# Patient Record
Sex: Male | Born: 1960 | Race: White | Hispanic: No | Marital: Married | Smoking: Never smoker
Health system: Southern US, Community
[De-identification: ages and names within clinical notes are randomized; demographics above are authoritative.]

## PROBLEM LIST (undated history)

## (undated) DIAGNOSIS — G4733 Obstructive sleep apnea (adult) (pediatric): Secondary | ICD-10-CM

## (undated) DIAGNOSIS — F32A Depression, unspecified: Secondary | ICD-10-CM

## (undated) DIAGNOSIS — T6702XA Exertional heatstroke, initial encounter: Secondary | ICD-10-CM

## (undated) DIAGNOSIS — I2510 Atherosclerotic heart disease of native coronary artery without angina pectoris: Secondary | ICD-10-CM

## (undated) DIAGNOSIS — K219 Gastro-esophageal reflux disease without esophagitis: Secondary | ICD-10-CM

## (undated) DIAGNOSIS — Z8719 Personal history of other diseases of the digestive system: Secondary | ICD-10-CM

## (undated) DIAGNOSIS — E785 Hyperlipidemia, unspecified: Secondary | ICD-10-CM

## (undated) DIAGNOSIS — G8929 Other chronic pain: Secondary | ICD-10-CM

## (undated) DIAGNOSIS — I6529 Occlusion and stenosis of unspecified carotid artery: Secondary | ICD-10-CM

## (undated) DIAGNOSIS — K760 Fatty (change of) liver, not elsewhere classified: Secondary | ICD-10-CM

## (undated) DIAGNOSIS — I10 Essential (primary) hypertension: Secondary | ICD-10-CM

## (undated) DIAGNOSIS — M1990 Unspecified osteoarthritis, unspecified site: Secondary | ICD-10-CM

## (undated) DIAGNOSIS — Z8739 Personal history of other diseases of the musculoskeletal system and connective tissue: Secondary | ICD-10-CM

## (undated) DIAGNOSIS — I493 Ventricular premature depolarization: Secondary | ICD-10-CM

## (undated) DIAGNOSIS — G4730 Sleep apnea, unspecified: Secondary | ICD-10-CM

## (undated) DIAGNOSIS — H409 Unspecified glaucoma: Secondary | ICD-10-CM

## (undated) DIAGNOSIS — Z9989 Dependence on other enabling machines and devices: Secondary | ICD-10-CM

## (undated) DIAGNOSIS — F329 Major depressive disorder, single episode, unspecified: Secondary | ICD-10-CM

## (undated) DIAGNOSIS — H269 Unspecified cataract: Secondary | ICD-10-CM

## (undated) DIAGNOSIS — K5792 Diverticulitis of intestine, part unspecified, without perforation or abscess without bleeding: Secondary | ICD-10-CM

## (undated) DIAGNOSIS — M549 Dorsalgia, unspecified: Secondary | ICD-10-CM

## (undated) DIAGNOSIS — J189 Pneumonia, unspecified organism: Secondary | ICD-10-CM

## (undated) DIAGNOSIS — F419 Anxiety disorder, unspecified: Secondary | ICD-10-CM

## (undated) DIAGNOSIS — Z8489 Family history of other specified conditions: Secondary | ICD-10-CM

## (undated) DIAGNOSIS — T7840XA Allergy, unspecified, initial encounter: Secondary | ICD-10-CM

## (undated) HISTORY — DX: Sleep apnea, unspecified: G47.30

## (undated) HISTORY — DX: Unspecified cataract: H26.9

## (undated) HISTORY — DX: Unspecified glaucoma: H40.9

## (undated) HISTORY — PX: COLONOSCOPY: SHX5424

## (undated) HISTORY — PX: SURGERY SCROTAL / TESTICULAR: SUR1316

## (undated) HISTORY — DX: Ventricular premature depolarization: I49.3

## (undated) HISTORY — PX: TONSILLECTOMY: SUR1361

## (undated) HISTORY — PX: SHOULDER SURGERY: SHX246

## (undated) HISTORY — DX: Hyperlipidemia, unspecified: E78.5

## (undated) HISTORY — DX: Allergy, unspecified, initial encounter: T78.40XA

## (undated) HISTORY — PX: EYE SURGERY: SHX253

---
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
## 1999-12-29 ENCOUNTER — Ambulatory Visit (HOSPITAL_COMMUNITY): Admission: RE | Admit: 1999-12-29 | Discharge: 1999-12-29 | Payer: Self-pay | Admitting: *Deleted

## 1999-12-29 ENCOUNTER — Encounter (INDEPENDENT_AMBULATORY_CARE_PROVIDER_SITE_OTHER): Payer: Self-pay | Admitting: Specialist

## 2004-10-02 ENCOUNTER — Ambulatory Visit (HOSPITAL_COMMUNITY): Admission: RE | Admit: 2004-10-02 | Discharge: 2004-10-02 | Payer: Self-pay | Admitting: *Deleted

## 2004-10-02 ENCOUNTER — Encounter (INDEPENDENT_AMBULATORY_CARE_PROVIDER_SITE_OTHER): Payer: Self-pay | Admitting: *Deleted

## 2008-02-22 ENCOUNTER — Encounter: Admission: RE | Admit: 2008-02-22 | Discharge: 2008-02-22 | Payer: Self-pay | Admitting: Internal Medicine

## 2008-03-09 ENCOUNTER — Encounter (INDEPENDENT_AMBULATORY_CARE_PROVIDER_SITE_OTHER): Payer: Self-pay | Admitting: Interventional Radiology

## 2008-03-09 ENCOUNTER — Ambulatory Visit (HOSPITAL_COMMUNITY): Admission: RE | Admit: 2008-03-09 | Discharge: 2008-03-09 | Payer: Self-pay | Admitting: Internal Medicine

## 2008-04-16 ENCOUNTER — Ambulatory Visit (HOSPITAL_COMMUNITY): Admission: RE | Admit: 2008-04-16 | Discharge: 2008-04-16 | Payer: Self-pay | Admitting: *Deleted

## 2008-04-16 ENCOUNTER — Encounter (INDEPENDENT_AMBULATORY_CARE_PROVIDER_SITE_OTHER): Payer: Self-pay | Admitting: *Deleted

## 2009-06-07 ENCOUNTER — Encounter: Admission: RE | Admit: 2009-06-07 | Discharge: 2009-06-07 | Payer: Self-pay | Admitting: Internal Medicine

## 2010-02-04 ENCOUNTER — Encounter: Admission: RE | Admit: 2010-02-04 | Discharge: 2010-02-04 | Payer: Self-pay | Admitting: Internal Medicine

## 2010-08-10 ENCOUNTER — Encounter: Payer: Self-pay | Admitting: Internal Medicine

## 2010-12-02 NOTE — Op Note (Signed)
William Mason, William Mason               ACCOUNT NO.:  [id number]   MEDICAL RECORD NO.:  [id number]          PATIENT TYPE:  AMB   LOCATION:  ENDO                         FACILITY:  [facility]   PHYSICIAN:  Georgiana Spinner, M.D.    DATE OF BIRTH:  October 24, 1960   DATE OF PROCEDURE:  DATE OF DISCHARGE:                               OPERATIVE REPORT   PROCEDURE:  Colonoscopy.   INDICATIONS:  Colon polyps.   ANESTHESIA:  Fentanyl 100 mcg, Versed 10 mg.   PROCEDURE:  With the patient mildly sedated in the left lateral  decubitus position a rectal exam was performed which was unremarkable to  my examination.  The prostate felt normal, but I may not have felt the  whole gland.  Subsequently, the Pentax videoscopic colonoscope was  inserted in the rectum and passed under direct vision to the cecum  identified by ileocecal valve and appendiceal orifice, both of which  were photographed.  From this point the colonoscope was slowly withdrawn  taking circumferential views of the colonic mucosa, stopping in the  ascending colon where two polyps were seen and each was removed first  with snare cautery technique and the second with hot biopsy forceps  technique.  Both were photographed and retrieved.  The endoscope was  then withdrawn all the way to the rectum which appeared normal.  On  direct showed hemorrhoids on retroflexed view.  The endoscope was  straightened and withdrawn.  The patient's vital signs and pulse  oximeter remained stable.  The patient tolerated the procedure well  without apparent complications.   FINDINGS:  1. Two small polyps of ascending colon, otherwise unremarkable      examination.  2. Internal hemorrhoids were noted.   PLAN:  Await biopsy report.  The patient will call me for results and  follow-up with me as needed as an outpatient.           ______________________________  Georgiana Spinner, M.D.     GMO/MEDQ  D:  04/16/2008  T:  04/16/2008  Job:  [id number]

## 2010-12-05 NOTE — Op Note (Signed)
[name], William Mason               ACCOUNT NO.:  [id number]   MEDICAL RECORD NO.:  [id number]          PATIENT TYPE:  AMB   LOCATION:  ENDO                         FACILITY:  [facility]   PHYSICIAN:  Georgiana Spinner, M.D.    DATE OF BIRTH:  1961/01/29   DATE OF PROCEDURE:  11/18/2004  DATE OF DISCHARGE:  10/02/2004                                 OPERATIVE REPORT   PROCEDURE:  Colonoscopy with biopsy.   INDICATIONS:  Rectal bleeding with colon polyp.   ANESTHESIA:  Demerol 80, Versed 8 milligrams.   PROCEDURE:  With the patient mildly sedated in the left lateral decubitus  position, the Olympus videoscopic colonoscope was inserted in the rectum  after normal rectal examination was performed was passed under direct vision  to the cecum, identified by ileocecal valve and appendiceal orifice.  In the  cecum was a small polyp that was also photographed and this was removed  using hot biopsy forceps technique setting of 20/200 blended current. The  endoscope was then slowly withdrawn taking circumferential views of the  remaining colonic mucosa stopping only in the rectum which appeared normal  in direct and retroflexed view.  The endoscope was straightened and  withdrawn.  The patient's vital signs and pulse oximeter remained stable.  The patient tolerated procedure well without apparent complications.   FINDINGS:  Small polyp of cecum, otherwise unremarkable examination.   PLAN:  Await biopsy report. The patient call me for results and follow-up  with me as an outpatient      GMO/MEDQ  D:  11/18/2004  T:  11/18/2004  Job:  [id number]

## 2010-12-05 NOTE — Op Note (Signed)
[facility]  Patient:    William Mason, William Mason                      MRN: [id number] Proc. Date: 12/29/99 Adm. Date:  [id number] Disc. Date: [id number] Attending:  Sabino Gasser                           Operative Report  PROCEDURE:  Upper endoscopy with biopsy.  INDICATIONS:  Reflux, rule out Barretts.  ANESTHESIA:  Demerol 50 mg, Versed 6 mg given intravenously in divided dose.  DESCRIPTION OF PROCEDURE:  With the patient mildly sedated in the left lateral decubitus position, the Olympus videoscopic endoscope was inserted in the mouth, passed under direct vision through the esophagus.  The distal esophagus was approached and appeared relatively normal.  Photographs were taken and biopsies  were taken of the GE junction to rule out Barretts.  The endoscope was advanced  into the stomach.  The fundus, body, and antrum were visualized and photographs  taken and CLOtest taken of the antrum.  Duodenal bulb, second portion of the duodenum were well visualized, appeared normal, and were photographed.  From this point, the endoscope was slowly withdrawn taking circumferential views of the entire duodenal mucosa, until the endoscope then pulled back into the stomach, placed in retroflexion to view the stomach from below and this too appeared normal. The endoscope was then straightened and pulled back from distal to proximal stomach taking circumferential views of the entire gastric and subsequentially esophageal mucosa which otherwise appeared normal.  The patients vital signs and pulse oximetry remained stable.  The patient tolerated the procedure well without apparent complications.  FINDINGS:  Changes of the antrum consistent with possible mild H.pylori infection. We have biopsied for Helicobacter.  The patient will call me for results of this and the biopsies of the distal esophagus and follow up with me as needed. DD:  12/29/99 TD:  12/31/99 Job:  [id number]

## 2010-12-05 NOTE — Op Note (Signed)
William Mason, HARVIE               ACCOUNT NO.:  [id number]   MEDICAL RECORD NO.:  [id number]          PATIENT TYPE:  AMB   LOCATION:  ENDO                         FACILITY:  [facility]   PHYSICIAN:  Georgiana Spinner, M.D.    DATE OF BIRTH:  01/22/1961   DATE OF PROCEDURE:  10/02/2004  DATE OF DISCHARGE:                                 OPERATIVE REPORT   PROCEDURE:  Colonoscopy with biopsy.   INDICATIONS:  Rectal bleeding with colon polyp.   ANESTHESIA:  Demerol 80 mg, Versed 8 mg.   DESCRIPTION OF PROCEDURE:  With the patient mildly sedated in the left  lateral decubitus position, the Olympus videoscopic colonoscope was inserted  into the rectum  and after normal rectal examination, was passed under  direct vision into the cecum ____________.  ____________ small polyp  ___________ removed with the hot biopsy forceps _____________.  The  endoscope was slowly withdrawn __________   IMPRESSION:  __________.   FOLLOWUP:  Follow up with me as an outpatient.      GMO/MEDQ  D:  10/02/2004  T:  10/02/2004  Job:  [id number]

## 2011-10-26 ENCOUNTER — Encounter (HOSPITAL_BASED_OUTPATIENT_CLINIC_OR_DEPARTMENT_OTHER): Payer: Self-pay

## 2011-10-26 DIAGNOSIS — R7401 Elevation of levels of liver transaminase levels: Secondary | ICD-10-CM | POA: Insufficient documentation

## 2011-10-26 DIAGNOSIS — R112 Nausea with vomiting, unspecified: Secondary | ICD-10-CM | POA: Insufficient documentation

## 2011-10-26 DIAGNOSIS — R197 Diarrhea, unspecified: Secondary | ICD-10-CM | POA: Insufficient documentation

## 2011-10-26 DIAGNOSIS — R7402 Elevation of levels of lactic acid dehydrogenase (LDH): Secondary | ICD-10-CM | POA: Insufficient documentation

## 2011-10-26 NOTE — ED Notes (Signed)
C/o n/v/d/fever since yesterday-reports fever 104 PTA-contact with GI virus in home

## 2011-10-27 ENCOUNTER — Emergency Department (HOSPITAL_BASED_OUTPATIENT_CLINIC_OR_DEPARTMENT_OTHER)
Admission: EM | Admit: 2011-10-27 | Discharge: 2011-10-27 | Disposition: A | Discharge status: Home / Self Care | Payer: BC Managed Care – PPO | Attending: Emergency Medicine | Admitting: Emergency Medicine

## 2011-10-27 DIAGNOSIS — R197 Diarrhea, unspecified: Secondary | ICD-10-CM

## 2011-10-27 HISTORY — DX: Diverticulitis of intestine, part unspecified, without perforation or abscess without bleeding: K57.92

## 2011-10-27 LAB — COMPREHENSIVE METABOLIC PANEL
ALT: 107 U/L — ABNORMAL HIGH (ref 0–53)
AST: 150 U/L — ABNORMAL HIGH (ref 0–37)
Alkaline Phosphatase: 37 U/L — ABNORMAL LOW (ref 39–117)
BUN: 21 mg/dL (ref 6–23)
Calcium: 9.2 mg/dL (ref 8.4–10.5)
Chloride: 97 mEq/L (ref 96–112)
GFR calc Af Amer: 65 mL/min — ABNORMAL LOW (ref >90)
GFR calc non Af Amer: 56 mL/min — ABNORMAL LOW (ref >90)
Potassium: 3.7 mEq/L (ref 3.5–5.1)
Total Bilirubin: 0.7 mg/dL (ref 0.3–1.2)
Total Protein: 7.1 g/dL (ref 6.0–8.3)

## 2011-10-27 LAB — DIFFERENTIAL
Basophils Absolute: 0 10*3/uL (ref 0.0–0.1)
Basophils Relative: 0 % (ref 0–1)
Eosinophils Absolute: 0 10*3/uL (ref 0.0–0.7)
Eosinophils Relative: 0 % (ref 0–5)
Lymphs Abs: 0.5 10*3/uL — ABNORMAL LOW (ref 0.7–4.0)
Monocytes Relative: 6 % (ref 3–12)
Neutrophils Relative %: 87 % — ABNORMAL HIGH (ref 43–77)

## 2011-10-27 LAB — CBC
MCH: 31.5 pg (ref 26.0–34.0)
Platelets: 154 10*3/uL (ref 150–400)

## 2011-10-27 MED ORDER — SODIUM CHLORIDE 0.9 % IV BOLUS (SEPSIS)
1000.0000 mL | Freq: Once | INTRAVENOUS | Status: AC
  Administered 2011-10-27: 1000 mL via INTRAVENOUS

## 2011-10-27 MED ORDER — ONDANSETRON 4 MG PO TBDP: ORAL_TABLET | ORAL | Status: AC

## 2011-10-27 MED ORDER — ONDANSETRON HCL 4 MG/2ML IJ SOLN
4.0000 mg | Freq: Once | INTRAMUSCULAR | Status: AC
  Administered 2011-10-27: 4 mg via INTRAVENOUS
  Filled 2011-10-27: qty 2

## 2011-10-27 NOTE — ED Provider Notes (Signed)
History   This chart was scribed for [name], MD by Sofie Rower. The patient was seen in room MH09/MH09 and the patient's care was started at 12:52 AM     CSN: [id number]  Arrival date & time 10/26/11  2244   First MD Initiated Contact with Patient 10/27/11 [phone]      Chief Complaint  Patient presents with  . Emesis  . Diarrhea    (Consider location/radiation/quality/duration/timing/severity/associated sxs/prior treatment) Patient is a 51 y.o. male presenting with vomiting. The history is provided by the patient. No language interpreter was used.  Emesis  This is a new problem. The current episode started yesterday. The problem occurs 2 to 4 times per day. The problem has not changed since onset.The emesis has an appearance of stomach contents. The maximum temperature recorded prior to his arrival was 103 to 104 F. The fever has been present for less than 1 day. Associated symptoms include arthralgias, diarrhea, a fever and myalgias. Pertinent negatives include no abdominal pain, no chills, no cough, no headaches, no sweats and no URI. Risk factors include ill contacts.    William Mason is a 51 y.o. male who presents to the Emergency Department complaining of moderate, episodic emesis (4-5X) onset two days ago with associated symptoms of diarrhea, fever (104), chills. Pt states his "wife was recently sick and told to prepare for the virus." Modifying factors include taking tylenol which moderately relieves the pain, nausea medication (zofran) with moderate relief. Pt has a hx of sick contacts (wife), diverticulitis, allergy to Vicodin.    Past Medical History  Diagnosis Date  . Diverticulitis     Past Surgical History  Procedure Date  . Sholder surgery   . Shoulder surgery   . Surgery scrotal / testicular       History  Substance Use Topics  . Smoking status: Never Smoker   . Smokeless tobacco: Not on file  . Alcohol Use: No      Review of Systems    Constitutional: Positive for fever. Negative for chills.  HENT: Negative.   Eyes: Negative.   Respiratory: Negative.  Negative for cough.   Cardiovascular: Negative.  Negative for chest pain.  Gastrointestinal: Positive for nausea, vomiting and diarrhea. Negative for abdominal pain and abdominal distention.  Genitourinary: Negative.   Musculoskeletal: Positive for myalgias and arthralgias.  Neurological: Negative.  Negative for headaches.  Hematological: Negative.   Psychiatric/Behavioral: Negative.   All other systems reviewed and are negative.    10 Systems reviewed and all are negative for acute change except as noted in the HPI.    Allergies  Vicodin  Home Medications  No current outpatient prescriptions on file.  BP 122/86  Pulse 119  Temp(Src) 100 F (37.8 C) (Oral)  Resp 18  Ht 5\' 11"  (1.803 m)  Wt 225 lb (102.059 kg)  BMI 31.38 kg/m2  SpO2 98%  Physical Exam  Nursing note and vitals reviewed. Constitutional: He is oriented to person, place, and time. He appears well-developed and well-nourished.  HENT:  Right Ear: External ear normal.  Left Ear: External ear normal.  Nose: Nose normal.  Mouth/Throat: Oropharynx is clear and moist.  Eyes: EOM are normal. Pupils are equal, round, and reactive to light.  Neck: Normal range of motion.  Cardiovascular: Normal rate and regular rhythm.  Exam reveals no gallop and no friction rub.   No murmur heard. Pulmonary/Chest: Effort normal. He has no wheezes. He has no rales.  Abdominal: Soft. He exhibits  no distension. There is no tenderness.  Musculoskeletal: Normal range of motion.  Neurological: He is alert and oriented to person, place, and time.  Skin: Skin is warm and dry.  Psychiatric: He has a normal mood and affect. His behavior is normal.    ED Course  Procedures (including critical care time)  DIAGNOSTIC STUDIES: Oxygen Saturation is 98% on room air, normal by my interpretation.    COORDINATION OF  CARE:     Results for orders placed during the hospital encounter of 10/27/11  CBC      Component Value Range   WBC 6.8  4.0 - 10.5 (K/uL)   RBC 4.95  4.22 - 5.81 (MIL/uL)   Hemoglobin 15.6  13.0 - 17.0 (g/dL)   HCT 16.1  09.6 - 04.5 (%)   MCV 88.7  78.0 - 100.0 (fL)   MCH 31.5  26.0 - 34.0 (pg)   MCHC 35.5  30.0 - 36.0 (g/dL)   RDW 40.9  81.1 - 91.4 (%)   Platelets 154  150 - 400 (K/uL)  DIFFERENTIAL      Component Value Range   Neutrophils Relative 87 (*) 43 - 77 (%)   Neutro Abs 5.9  1.7 - 7.7 (K/uL)   Lymphocytes Relative 7 (*) 12 - 46 (%)   Lymphs Abs 0.5 (*) 0.7 - 4.0 (K/uL)   Monocytes Relative 6  3 - 12 (%)   Monocytes Absolute 0.4  0.1 - 1.0 (K/uL)   Eosinophils Relative 0  0 - 5 (%)   Eosinophils Absolute 0.0  0.0 - 0.7 (K/uL)   Basophils Relative 0  0 - 1 (%)   Basophils Absolute 0.0  0.0 - 0.1 (K/uL)  COMPREHENSIVE METABOLIC PANEL      Component Value Range   Sodium 132 (*) 135 - 145 (mEq/L)   Potassium 3.7  3.5 - 5.1 (mEq/L)   Chloride 97  96 - 112 (mEq/L)   CO2 21  19 - 32 (mEq/L)   Glucose, Bld 131 (*) 70 - 99 (mg/dL)   BUN 21  6 - 23 (mg/dL)   Creatinine, Ser 7.82 (*) 0.50 - 1.35 (mg/dL)   Calcium 9.2  8.4 - 95.6 (mg/dL)   Total Protein 7.1  6.0 - 8.3 (g/dL)   Albumin 4.1  3.5 - 5.2 (g/dL)   AST 213 (*) 0 - 37 (U/L)   ALT 107 (*) 0 - 53 (U/L)   Alkaline Phosphatase 37 (*) 39 - 117 (U/L)   Total Bilirubin 0.7  0.3 - 1.2 (mg/dL)   GFR calc non Af Amer 56 (*) >90 (mL/min)   GFR calc Af Amer 65 (*) >90 (mL/min)   No results found.    No diagnosis found.  12:54PM- EDP at bedside discusses treatment plan.   MDM  Patient informed of elevation in ASt and alt, states he knows and has had liver biopsy.  Told to follow up with PMD for stool cultures and get probiotic and to return for uncontrolled fever, chills, abdominal pain particularly that localizes to the RLQ or LLQ intractable vomiting or any concerns.  Exam and labs reassuring no indication for  imaging at this time      I personally performed the services described in this documentation, which was scribed in my presence. The recorded information has been reviewed and considered.     Jasmine Awe, MD 10/27/11 [phone]

## 2011-10-27 NOTE — Discharge Instructions (Signed)
B.R.A.T. Diet Your doctor has recommended the B.R.A.T. diet for you or your child until the condition improves. This is often used to help control diarrhea and vomiting symptoms. If you or your child can tolerate clear liquids, you may have:  Bananas.   Rice.   Applesauce.   Toast (and other simple starches such as crackers, potatoes, noodles).  Be sure to avoid dairy products, meats, and fatty foods until symptoms are better. Fruit juices such as apple, grape, and prune juice can make diarrhea worse. Avoid these. Continue this diet for 2 days or as instructed by your caregiver. Document Released: 07/06/2005 Document Revised: 06/25/2011 Document Reviewed: 12/23/2006 Exit[employer] Patient Information 2012 [employer]. 

## 2011-12-01 ENCOUNTER — Other Ambulatory Visit: Payer: Self-pay | Admitting: Internal Medicine

## 2011-12-07 ENCOUNTER — Ambulatory Visit
Admission: RE | Admit: 2011-12-07 | Discharge: 2011-12-07 | Disposition: A | Discharge status: Home / Self Care | Payer: BC Managed Care – PPO | Source: Ambulatory Visit | Attending: Internal Medicine | Admitting: Internal Medicine

## 2012-08-10 ENCOUNTER — Encounter: Payer: Self-pay | Admitting: Gastroenterology

## 2012-09-09 ENCOUNTER — Telehealth: Payer: Self-pay | Admitting: *Deleted

## 2012-09-09 NOTE — Telephone Encounter (Signed)
Pt. No show for previsit/ has rescheduled previsit for 09/13/12

## 2012-09-13 ENCOUNTER — Ambulatory Visit (AMBULATORY_SURGERY_CENTER): Payer: BC Managed Care – PPO | Admitting: *Deleted

## 2012-09-13 ENCOUNTER — Encounter: Payer: Self-pay | Admitting: Gastroenterology

## 2012-09-13 VITALS — Ht 71.0 in | Wt 196.2 lb

## 2012-09-13 DIAGNOSIS — Z1211 Encounter for screening for malignant neoplasm of colon: Secondary | ICD-10-CM

## 2012-09-13 DIAGNOSIS — Z86010 Personal history of colonic polyps: Secondary | ICD-10-CM

## 2012-09-13 MED ORDER — MOVIPREP 100 G PO SOLR: 1.0000 | Freq: Once | ORAL | Status: DC

## 2012-09-23 ENCOUNTER — Encounter: Payer: Self-pay | Admitting: Gastroenterology

## 2012-09-23 ENCOUNTER — Encounter: Payer: BC Managed Care – PPO | Admitting: Gastroenterology

## 2012-09-23 ENCOUNTER — Ambulatory Visit (AMBULATORY_SURGERY_CENTER): Payer: BC Managed Care – PPO | Admitting: Gastroenterology

## 2012-09-23 VITALS — BP 127/67 | HR 70 | Temp 98.3°F | Resp 61 | Ht 71.0 in | Wt 196.0 lb

## 2012-09-23 DIAGNOSIS — Z860100 Personal history of colon polyps, unspecified: Secondary | ICD-10-CM

## 2012-09-23 DIAGNOSIS — D126 Benign neoplasm of colon, unspecified: Secondary | ICD-10-CM

## 2012-09-23 DIAGNOSIS — Z1211 Encounter for screening for malignant neoplasm of colon: Secondary | ICD-10-CM

## 2012-09-23 MED ORDER — SODIUM CHLORIDE 0.9 % IV SOLN: 500.0000 mL | INTRAVENOUS | Status: DC

## 2012-09-23 NOTE — Progress Notes (Signed)
Called to room to assist during endoscopic procedure.  Patient ID and intended procedure confirmed with present staff. Received instructions for my participation in the procedure from the performing physician.  

## 2012-09-23 NOTE — Op Note (Signed)
[facility] [address] [place], [address]   COLONOSCOPY PROCEDURE REPORT  PATIENT: William Mason, William Mason.  MR#: [id number] BIRTHDATE: 06-11-1961 , 51  yrs. old GENDER: Male ENDOSCOPIST: Meryl Dare, MD, [facility] REFERRED AO:[id number] Kim, M.D. PROCEDURE DATE:  09/23/2012 PROCEDURE:   Colonoscopy with snare polypectomy ASA CLASS:   Class II INDICATIONS:Patient's personal history of adenomatous colon polyps: TVA 2006. MEDICATIONS: MAC sedation, administered by CRNA and propofol (Diprivan) 250mg  IV DESCRIPTION OF PROCEDURE:   After the risks benefits and alternatives of the procedure were thoroughly explained, informed consent was obtained.  A digital rectal exam revealed no abnormalities of the rectum.   The LB CF-[id number]  endoscope was introduced through the anus and advanced to the cecum, which was identified by both the appendix and ileocecal valve. No adverse events experienced.   The quality of the prep was excellent, using MoviPrep  The instrument was then slowly withdrawn as the colon was fully examined.  COLON FINDINGS: A sessile polyp measuring 7 mm in size was found in the ascending colon.  A polypectomy was performed using snare cautery.  The resection was complete and the polyp tissue was completely retrieved.   A sessile polyp measuring 5 mm in size was found in the transverse colon.  A polypectomy was performed using snare cautery.  The resection was complete and the polyp tissue was completely retrieved.   Moderate diverticulosis was noted in the sigmoid colon.   The colon was otherwise normal.  There was no diverticulosis, inflammation, polyps or cancers unless previously stated.  Retroflexed views revealed small internal hemorrhoids. The time to cecum=2 minutes 18 seconds.  Withdrawal time=10 minutes 39 seconds.  The scope was withdrawn and the procedure completed. COMPLICATIONS: There were no complications.  ENDOSCOPIC IMPRESSION: 1.    Sessile polyp measuring 7 mm in the ascending colon; polypectomy was performed using snare cautery 2.   Sessile polyp measuring 5 mm in the transverse colon; polypectomy was performed using snare cautery 3.   Moderate diverticulosis was noted in the sigmoid colon 4.   Small internal hemorrhoids  RECOMMENDATIONS: 1.  Hold aspirin, aspirin products, and anti-inflammatory medication for 2 weeks. 2.  Await pathology results 3.  High fiber diet with liberal fluid intake. 4.  Repeat Colonoscopy in 3 years.  eSigned:  Meryl Dare, MD, [facility] 09/23/2012 12:29 PM

## 2012-09-23 NOTE — Progress Notes (Signed)
Patient did not experience any of the following events: a burn prior to discharge; a fall within the facility; wrong site/side/patient/procedure/implant event; or a hospital transfer or hospital admission upon discharge from the facility. (G8907) Patient did not have preoperative order for IV antibiotic SSI prophylaxis. (G8918)  

## 2012-09-23 NOTE — Patient Instructions (Addendum)
Impressions/recommendations:  Polyps (handout given) Diverticulosis (handout given) Hemorrhoids (handout given)  High Fiber Diet (handout given)  Hold aspirin, aspirin products, and antiinflammatory medications for 2 weeks. May resume 10/08/12.  Repeat colonoscopy in 3 years.  YOU HAD AN ENDOSCOPIC PROCEDURE TODAY AT THE [facility]: Refer to the procedure report that was given to you for any specific questions about what was found during the examination.  If the procedure report does not answer your questions, please call your gastroenterologist to clarify.  If you requested that your care partner not be given the details of your procedure findings, then the procedure report has been included in a sealed envelope for you to review at your convenience later.  YOU SHOULD EXPECT: Some feelings of bloating in the abdomen. Passage of more gas than usual.  Walking can help get rid of the air that was put into your GI tract during the procedure and reduce the bloating. If you had a lower endoscopy (such as a colonoscopy or flexible sigmoidoscopy) you may notice spotting of blood in your stool or on the toilet paper. If you underwent a bowel prep for your procedure, then you may not have a normal bowel movement for a few days.  DIET: Your first meal following the procedure should be a light meal and then it is ok to progress to your normal diet.  A half-sandwich or bowl of soup is an example of a good first meal.  Heavy or fried foods are harder to digest and may make you feel nauseous or bloated.  Likewise meals heavy in dairy and vegetables can cause extra gas to form and this can also increase the bloating.  Drink plenty of fluids but you should avoid alcoholic beverages for 24 hours.  ACTIVITY: Your care partner should take you home directly after the procedure.  You should plan to take it easy, moving slowly for the rest of the day.  You can resume normal activity the day after the  procedure however you should NOT DRIVE or use heavy machinery for 24 hours (because of the sedation medicines used during the test).    SYMPTOMS TO REPORT IMMEDIATELY: A gastroenterologist can be reached at any hour.  During normal business hours, 8:30 AM to 5:00 PM Monday through Friday, call [phone].  After hours and on weekends, please call the GI answering service at [phone] who will take a message and have the physician on call contact you.   Following lower endoscopy (colonoscopy or flexible sigmoidoscopy):  Excessive amounts of blood in the stool  Significant tenderness or worsening of abdominal pains  Swelling of the abdomen that is new, acute  Fever of 100F or higher   FOLLOW UP: If any biopsies were taken you will be contacted by phone or by letter within the next 1-3 weeks.  Call your gastroenterologist if you have not heard about the biopsies in 3 weeks.  Our staff will call the home number listed on your records the next business day following your procedure to check on you and address any questions or concerns that you may have at that time regarding the information given to you following your procedure. This is a courtesy call and so if there is no answer at the home number and we have not heard from you through the emergency physician on call, we will assume that you have returned to your regular daily activities without incident.  SIGNATURES/CONFIDENTIALITY: You and/or your care partner have signed paperwork which  will be entered into your electronic medical record.  These signatures attest to the fact that that the information above on your After Visit Summary has been reviewed and is understood.  Full responsibility of the confidentiality of this discharge information lies with you and/or your care-partner.

## 2012-09-26 ENCOUNTER — Telehealth: Payer: Self-pay | Admitting: *Deleted

## 2012-09-26 NOTE — Telephone Encounter (Signed)
  Follow up Call-  Call back number 09/23/2012  Post procedure Call Back phone  # [phone]  Permission to leave phone message Yes     Patient questions:  Do you have a fever, pain , or abdominal swelling? no Pain Score  0 *  Have you tolerated food without any problems? yes  Have you been able to return to your normal activities? yes  Do you have any questions about your discharge instructions: Diet   no Medications  no Follow up visit  no  Do you have questions or concerns about your Care? no  Actions: * If pain score is 4 or above: No action needed, pain <4.

## 2012-09-27 ENCOUNTER — Encounter: Payer: Self-pay | Admitting: Gastroenterology

## 2013-10-04 ENCOUNTER — Ambulatory Visit: Payer: BC Managed Care – PPO | Attending: Internal Medicine | Admitting: Rehabilitation

## 2013-10-04 DIAGNOSIS — M25569 Pain in unspecified knee: Secondary | ICD-10-CM | POA: Diagnosis not present

## 2013-10-04 DIAGNOSIS — IMO0001 Reserved for inherently not codable concepts without codable children: Secondary | ICD-10-CM | POA: Diagnosis present

## 2013-10-04 DIAGNOSIS — M25559 Pain in unspecified hip: Secondary | ICD-10-CM | POA: Insufficient documentation

## 2013-10-09 ENCOUNTER — Ambulatory Visit: Payer: BC Managed Care – PPO | Admitting: Rehabilitation

## 2013-10-09 DIAGNOSIS — IMO0001 Reserved for inherently not codable concepts without codable children: Secondary | ICD-10-CM | POA: Diagnosis not present

## 2013-10-11 ENCOUNTER — Ambulatory Visit: Payer: BC Managed Care – PPO | Admitting: Rehabilitation

## 2013-10-11 DIAGNOSIS — IMO0001 Reserved for inherently not codable concepts without codable children: Secondary | ICD-10-CM | POA: Diagnosis not present

## 2013-10-16 ENCOUNTER — Ambulatory Visit: Payer: BC Managed Care – PPO | Admitting: Rehabilitation

## 2013-10-16 DIAGNOSIS — IMO0001 Reserved for inherently not codable concepts without codable children: Secondary | ICD-10-CM | POA: Diagnosis not present

## 2013-10-18 ENCOUNTER — Ambulatory Visit: Payer: BC Managed Care – PPO | Attending: Internal Medicine | Admitting: Rehabilitation

## 2013-10-18 DIAGNOSIS — M25569 Pain in unspecified knee: Secondary | ICD-10-CM | POA: Insufficient documentation

## 2013-10-18 DIAGNOSIS — M25559 Pain in unspecified hip: Secondary | ICD-10-CM | POA: Insufficient documentation

## 2013-10-18 DIAGNOSIS — IMO0001 Reserved for inherently not codable concepts without codable children: Secondary | ICD-10-CM | POA: Insufficient documentation

## 2013-10-25 ENCOUNTER — Ambulatory Visit: Payer: BC Managed Care – PPO | Admitting: Rehabilitation

## 2013-10-27 ENCOUNTER — Ambulatory Visit: Payer: BC Managed Care – PPO | Admitting: Rehabilitation

## 2015-08-28 ENCOUNTER — Encounter: Payer: Self-pay | Admitting: Gastroenterology

## 2016-09-28 ENCOUNTER — Other Ambulatory Visit (HOSPITAL_COMMUNITY): Payer: Self-pay | Admitting: Ophthalmology

## 2016-09-28 ENCOUNTER — Ambulatory Visit (HOSPITAL_COMMUNITY)
Admission: RE | Admit: 2016-09-28 | Discharge: 2016-09-28 | Disposition: A | Discharge status: Home / Self Care | Payer: BLUE CROSS/BLUE SHIELD | Source: Ambulatory Visit | Attending: Ophthalmology | Admitting: Ophthalmology

## 2016-09-28 DIAGNOSIS — M25511 Pain in right shoulder: Secondary | ICD-10-CM

## 2016-09-28 DIAGNOSIS — Z01818 Encounter for other preprocedural examination: Secondary | ICD-10-CM | POA: Diagnosis not present

## 2017-02-19 ENCOUNTER — Other Ambulatory Visit: Payer: Self-pay | Admitting: Internal Medicine

## 2017-02-19 DIAGNOSIS — R55 Syncope and collapse: Secondary | ICD-10-CM

## 2017-02-25 ENCOUNTER — Ambulatory Visit
Admission: RE | Admit: 2017-02-25 | Discharge: 2017-02-25 | Disposition: A | Discharge status: Home / Self Care | Payer: BLUE CROSS/BLUE SHIELD | Source: Ambulatory Visit | Attending: Internal Medicine | Admitting: Internal Medicine

## 2017-02-25 DIAGNOSIS — R55 Syncope and collapse: Secondary | ICD-10-CM

## 2017-03-29 ENCOUNTER — Encounter: Payer: Self-pay | Admitting: Neurology

## 2017-03-30 ENCOUNTER — Ambulatory Visit (INDEPENDENT_AMBULATORY_CARE_PROVIDER_SITE_OTHER): Payer: BLUE CROSS/BLUE SHIELD | Admitting: Neurology

## 2017-03-30 ENCOUNTER — Telehealth: Payer: Self-pay | Admitting: Neurology

## 2017-03-30 ENCOUNTER — Encounter: Payer: Self-pay | Admitting: Neurology

## 2017-03-30 ENCOUNTER — Encounter (INDEPENDENT_AMBULATORY_CARE_PROVIDER_SITE_OTHER): Payer: Self-pay

## 2017-03-30 VITALS — BP 132/72 | HR 78 | Ht 71.0 in | Wt 224.0 lb

## 2017-03-30 DIAGNOSIS — G4733 Obstructive sleep apnea (adult) (pediatric): Secondary | ICD-10-CM | POA: Diagnosis not present

## 2017-03-30 DIAGNOSIS — Z9989 Dependence on other enabling machines and devices: Secondary | ICD-10-CM | POA: Diagnosis not present

## 2017-03-30 DIAGNOSIS — E663 Overweight: Secondary | ICD-10-CM | POA: Diagnosis not present

## 2017-03-30 NOTE — Telephone Encounter (Signed)
Pt had a sleep study completed 7 years ago at [place] with Dr Shelia Media. I have reached out to the office to see if they could send [place] records of the sleep study. LVM with medical records there. Left our phone and fax number.

## 2017-03-30 NOTE — Progress Notes (Signed)
SLEEP MEDICINE CLINIC   Provider:  Larey Seat, M D  Primary Care Physician:  Jani Gravel, MD   Referring Provider: Jani Gravel, MD     HPI:  William Mason is a 56 y.o. male , seen here as in a referral/ revisit  from Dr. Maudie Mercury for a sleep apnea re- evaluation.   William Mason is a 56 year old right-handed Caucasian patient of Dr. Maudie Mercury. He remembers a sleep study taking place about 7 years ago at [facility] on [address] in the basement of Dr. Julianne Rice office. He was then placed on CPAP and has used it ever since. He still uses his first machine with the same settings that were prescribed in 2011. In the meantime he has sometimes lost weight and sometimes gained weight, he may have been as heavy as 220 pounds at the time of his sleep study. He also remembers that changes in his body mass index seemed to not correlate with changes in his apnea frequency or his need for CPAP. Dr. Maudie Mercury initiated a consultation with Dr. Vinetta Bergamo, an otolaryngologist at [facility]. Dr. Vinetta Bergamo was fascinated with the epiglottal anatomy of the patient. However no specific treatment could be initiated or resulted. Dr. Vinetta Bergamo discouraged at the time a UPPP.  William Mason is here today because he hasn't had follow-up for his CPAP needs, does not know if he has a certain degree of apnea or if he is perhaps even apnea free.  He reportedly has used his CPAP compliantly ever since it was issued and when he doesn't use CPAP (during an unscheduled nap for example), he feels that his sleep is not is refreshing and restorative.  William Mason is most recent encounter with Dr. Maudie Mercury was related to a syncope. It is presumed that he had a heat stroke. Extensive laboratory testing showed elevated transaminases, elevated monocytes, elevated hematocrit. I do not think that he was dehydrated as his BUN was 14.2. Glucose was normal. There was no evidence of a silent myocardial infarction a CT of the head without contrast was negative,  carotid duplex was normal, cardiac stress test by treadmill was abnormal. His  echocardiograms is pending .  Sleep habits are as follows: Last hour before bedtime : feeds the cats, and watches TV in the living room, goes to bed at about 11 Pm, his wife is already in bed by 9 PM. The patient has at least one bathroom break usually in the early morning hours( 3-4 AM) and can go back to sleep after that. He is using CPAP every night. He averages at least 7 hours of nocturnal sleep. He does not report any sleep choking, palpitations, diaphoresis at night. Over the last year he has developed very vivid sometimes bizarre dreams, lucid dreams.. These include encounters with now deceased family members etc.and  he has not enacted dreams. He gets up at 6.30.    Sleep medical history and family sleep history: single child, both parents snored. Mother has dementia. Tonsillectomy at age 18-5 , and nasal septoplasty at age 35 and broke nose again in high school .  Social history: he works outdoors, Biomedical scientist. married with 5 cats. Non smoker - non drinker, caffeine : 1 cup a day ( coffee) no iced tea, nor soda.   Review of Systems: Out of a complete 14 system review, the patient complains of only the following symptoms, and all other reviewed systems are negative.  How likely are you to doze in the following situations: 0 =  not likely, 1 = slight chance, 2 = moderate chance, 3 = high chance  Sitting and Reading? 3 Watching Television? 3 Sitting inactive in a public place (theater or meeting)?2 As a passenger in a car for an hour without a break?2  Lying down in the afternoon when circumstances permit?3 Sitting and talking to someone?0 Sitting quietly after lunch without alcohol? 2 In a car, while stopped for a few minutes in traffic?0   Total = Epworth score 15 , Fatigue severity score 20 , depression score 1/15    Social History   Social History  . Marital status: Married    Spouse name: N/A    . Number of children: N/A  . Years of education: N/A   Occupational History  . Not on file.   Social History Main Topics  . Smoking status: Never Smoker  . Smokeless tobacco: Former Systems developer  . Alcohol use Yes     Comment: rarely  . Drug use: No  . Sexual activity: Not on file   Other Topics Concern  . Not on file   Social History Narrative  . No narrative on file    Family History  Problem Relation Age of Onset  . Breast cancer Maternal Grandmother   . Leukemia Paternal Grandfather   . Heart disease Father   . COPD Father   . Colon cancer Neg Hx   . Esophageal cancer Neg Hx   . Rectal cancer Neg Hx   . Stomach cancer Neg Hx     Past Medical History:  Diagnosis Date  . Diverticulitis   . Hyperlipidemia   . Sleep apnea    uses cpap    Past Surgical History:  Procedure Laterality Date  . SHOULDER SURGERY    . SURGERY SCROTAL / TESTICULAR    . tonsillect    . TONSILLECTOMY      Current Outpatient Prescriptions  Medication Sig Dispense Refill  . aspirin 81 MG tablet Take 81 mg by mouth daily.    . cetirizine (ZYRTEC) 10 MG tablet Take 10 mg by mouth as needed for allergies.    . ciclopirox (PENLAC) 8 % solution Apply topically at bedtime. Apply over nail and surrounding skin. Apply daily over previous coat. After seven (7) days, may remove with alcohol and continue cycle.    . cyanocobalamin 500 MCG tablet Take 500 mcg by mouth daily.    . diphenhydrAMINE (BENADRYL) 25 mg capsule Take 25 mg by mouth at bedtime as needed for itching.    . Fenofibrate (LIPOFEN) 150 MG CAPS Take 150 mg by mouth daily.    . fluticasone (VERAMYST) 27.5 MCG/SPRAY nasal spray Place 2 sprays into the nose as needed for rhinitis.    . magnesium gluconate (MAGONATE) 500 MG tablet Take 500 mg by mouth daily.    . metaxalone (SKELAXIN) 800 MG tablet Take 800 mg by mouth as needed for pain.    . Multiple Vitamin (MULTIVITAMIN) tablet Take 1 tablet by mouth daily.    . naftifine (NAFTIN) 1 %  cream Apply topically as needed.    Marland Kitchen oxaprozin (DAYPRO) 600 MG tablet Take 1,200 mg by mouth as needed.    . Pitavastatin Calcium (LIVALO) 2 MG TABS Take 1 tablet by mouth daily.    . RABEprazole (ACIPHEX) 20 MG tablet Take 20 mg by mouth daily.     No current facility-administered medications for this visit.     Allergies as of 03/30/2017 - Review Complete 09/23/2012  Allergen Reaction Noted  .  Vicodin [hydrocodone-acetaminophen] Nausea Only 10/26/2011    Vitals: Last Weight:  Wt Readings from Last 1 Encounters:  09/23/12 196 lb (88.9 kg)   BMI:    Last Height:   Ht Readings from Last 1 Encounters:  09/23/12 5\' 11"  (1.803 m)    Physical exam:  General: The patient is awake, alert and appears not in acute distress. The patient is well groomed. Head: Normocephalic, atraumatic. Neck is supple. Mallampati , 1- uvula lifts completely  neck circumference: 17. Nasal airflow patent , TMJ click is not  evident . Retrognathia is seen.  Used to wear braces top only.  Cardiovascular:  Regular rate and rhythm , without  murmurs or carotid bruit, and without distended neck veins. Respiratory: Lungs are clear to auscultation. Skin:  Without evidence of edema, or rash Trunk: BMI is 31.66 The patient's posture is erect   Neurologic exam: Attention span & concentration ability appears normal.  Speech is fluent,  without  dysarthria, dysphonia or aphasia.  Mood and affect are appropriate.  Cranial nerves: Pupils are equal and briskly reactive to light. Funduscopic exam without  evidence of pallor or edema. Extraocular movements  in vertical and horizontal planes intact and without nystagmus. Visual fields by finger perimetry are intact.Hearing to finger rub intact.  Facial sensation intact to fine touch. Facial motor strength is symmetric and tongue and uvula move midline. Shoulder shrug was symmetrical.  Motor exam:  Normal tone, muscle bulk and symmetric strength in all extremities. He has  shoulder pain and crepitus in both shoulders. Dislocated biceps tendon on the right, rotator cuff injury.  Sensory:  Fine touch, pinprick and vibration were tested in all extremities. Proprioception tested in the upper extremities was normal. Numbness between big toe and neighbor on the left foot. S1?  Coordination:  Finger-to-nose maneuver  normal without evidence of ataxia, dysmetria or tremor. Gait and station: Patient walks without assistive device - Strength within normal limits.Stance is stable and normal.   Deep tendon reflexes: in the  upper and lower extremities are symmetric and intact. Babinski maneuver response is downgoing.  Assessment:  After physical and neurologic examination, review of laboratory studies,  Personal review of imaging studies, reports of other /same  Imaging studies, results of polysomnography and / or neurophysiology testing and pre-existing records as far as provided in visit., my assessment is   1) since I have no access to the original sleep study and cannot interrogate a 56 year old ResMed CPAP anymore - software is no longer available. I only option is to repeat a sleep study see at what level of apnea the patient currently is affected if he still has apnea, and then work with an auto titrate him - he ois covered by [employer].   The patient was advised of the nature of the diagnosed disorder , the treatment options and the  risks for general health and wellness arising from not treating the condition.   I spent more than  45  minutes of face to face time with the patient.  Greater than 50% of time was spent in counseling and coordination of care. We have discussed the diagnosis and differential and I answered the patient's questions.    Plan:  Treatment plan and additional workup :  HST and than , if needed, auto-titration. Rv after test .    Larey Seat, MD 07/21/5850, 77:82 AM  Certified in Neurology by ABPN Certified in Sleep Medicine by  [facility] [id number]  [address] [place], [place] [address]

## 2017-04-25 DIAGNOSIS — R9439 Abnormal result of other cardiovascular function study: Secondary | ICD-10-CM

## 2017-04-25 NOTE — H&P (Signed)
William Mason 04/09/2017 7:52 AM Location: [facility] Patient #: [id number] DOB: [birth year] Married / Language: [name] / Race: White Male   History of Present Illness William Gaskins [name] MD; 04/09/2017 11:41 AM) Patient words: Last O/V 03/10/2017; 4 Week F/U for Syncope, HTN, Gxt & Echo results  **Read pending pt msg**.  The patient is a 56 year old male presenting to discuss test results. Patient here to follow-up after his recent treadmill exercise stress test and echocardiogram..  I see him initially for evaluation of syncope. Syncopal episode happened while working outside on a hot day, most likely vasovagal related to dehydration. By histoy, I did not think this was an arrhythmogenic episode He does not have any symptoms of palpitations, chest pain, dyspnea at baseline. However, given his risk factors for CAD including age, sex, hypertension, hyperlipidemia, and family history of CAD, I recommend treadmill exercise stress test, results below.  We initially discussed medical management for possible CAD given incidental finding of abnormal EKG treadmill stress test. However, this leaves [place] with an unknown about his coronary anatomy. Patient has been anxious about these results and asked me what would be the best next step.  In the meantime, he has made changes to his diet and lifestyle, and has lost 7 lbs. He continues to have no symptoms of angina, dyspnea. He has not had any recurrence of his syncope. He reports incidentally finding that he has occasional skipped beat (once) without any symptoms.  Treadmill exercise stress test 03/15/2017: Indication: Syncope & Collapse Resting EKG demonstrates NSR. The patient exercised according to Bruce Protocol, Total time recorded 10:03 min achieving max heart rate of 169 which was 104% of THR for age and 11.83 METS of work. Stress terminated due to fatigue andTHR (>85% MPHR)/MPHR met. There was 2 mm Horizontal ST depression  suggestive of ischemia with exercise stress test persisting for > 4 minutes into recovery . There were no significant arrhythmias. Hypertensive BP with resting BP 148/92 mm Hg and peak BP 216/78 mm Hg. Rec: There was 2 mm Horizontal ST depression suggestive of ischemia with exercise stress test persisting for > 4 minutes into recovery . Exercise tolerence is excellent. Hypertensive BP with resting BP 148/92 mm Hg and peak BP 216/78 mm Hg. No significant arrhythmias. Consider further cardiac work-up.   10/02/2016: Total cholesterol 277, LDL 175, HDL 32, triglycerides 301     Problem List/Past Medical (April Harrington; 04/09/2017 10:20 AM) Diverticulitis (K57.92)  Sleep apnea (G47.30)  Hyperlipidemia, group A (E78.00)  Acid reflux (K21.9)  Colonic Polyps  Laboratory examination (Z01.89)  Labs 02/16/2017: WBC 5.2, hemoglobin 16.4, hematocrit 48.2. Normocytic index. Platelets 242 Glucose 97, BUN 17/creatinine 1.2. EGFR 66-80. Sodium 138, potassium 4.5. Total protein 7.2, albumin 4.2. AST 57, ALT 104 both mildly elevated. Alkaline phosphatase 58 normal. TSH 1.57 normal 10/02/2016: Total cholesterol 277, LDL 175, HDL 32, triglycerides 301 Syncope and collapse (R55)  Echocardiogram 04/01/2017: Left ventricle cavity is normal in size. Normal global wall motion. Normal diastolic filling pattern. Calculated EF 67%. Left atrial cavity is normal in size. 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 No evidence of pulmonary hypertension. CT head without contrast 02/25/2017: Stable exam. Unremarkable noncontrast CT evaluation of the brain. Treadmill exercise stress test 03/15/2017: Indication: Syncope & Collapse Resting EKG demonstrates NSR. The patient exercised according to Bruce Protocol, Total time recorded 10:03 min achieving max heart rate of 169 which was 104% of THR for age and 11.83 METS of work. Stress  terminated due to fatigue andTHR (>85%  MPHR)/MPHR met. There was 2 mm Horizontal ST depression suggestive of ischemia with exercise stress test persisting for > 4 minutes into recovery . There were no significant arrhythmias. Hypertensive BP with resting BP 148/92 mm Hg and peak BP 216/78 mm Hg. Rec: There was 2 mm Horizontal ST depression suggestive of ischemia with exercise stress test persisting for > 4 minutes into recovery . Exercise tolerence is excellent. Hypertensive BP with resting BP 148/92 mm Hg and peak BP 216/78 mm Hg. No significant arrhythmias. Consider further cardiac work-up. Hypertension, benign (I10)  Obesity (BMI 30.0-34.9) (E66.9)  Stenosis of left carotid artery (I65.22)  Ultrasound carotid bilateral 02/25/2017: Right carotid artery: Mild irregular mixed plaque in both. Low-resistance internal carotid Doppler pattern. Left carotid artery: Moderate irregular test 5 plaque in the bulb. Low-resistance internal carotid Doppler pattern. Bilateral vertebral arteries: Antegrade Impression: <50% stenosis of the right internal carotid artery 50-69% stenosis and left internal carotid artery  Allergies (April Harrington; 04-28-17 10:20 AM) Codeine Sulfate *ANALGESICS - OPIOID*  Nausea.  Family History (April Harrington; 28-Apr-2017 10:20 AM) Mother  Severe mitral regurgitation H/o emergency appendectomy Dementia Hypothyroidism Father  Deceased. MI & CVA at Age 12 Carotid endarterectomy b/l AAA "Leaky valve" Cancer  Metastatic breast cancer at Age 49, deceased Maternal grandfather had leukemia, deceased Heart Disease  Diabetes Mellitus Type II  Maternal Grandfather.  Social History (April Harrington; Apr 28, 2017 10:20 AM) Current tobacco use  Never smoker. Non Drinker/No Alcohol Use  Marital status  Married. Living Situation  Lives with spouse. Number of Children  0.  Past Surgical History (April Harrington; 28-Apr-2017 10:20 AM) Shoulder Surgery [1992]: Left.  Medication History (April Harrington;  April 28, 2017 10:25 AM) Oxycodone-Acetaminophen (5-'325MG'$  Tablet, 1 Oral as needed) Active. DiazePAM ('5MG'$  Tablet, 1 Oral as needed) Active. TraMADol HCl ('50MG'$  Tablet, 1 Oral as needed) Active. DULoxetine HCl ('30MG'$  Capsule DR Part, 1 Oral two times daily) Active. Fluticasone Propionate (50MCG/ACT Suspension, 1 Nasal daily) Active. Metaxalone ('800MG'$  Tablet, 1 Oral as needed) Active. Oxaprozin ('600MG'$  Tablet, 1 Oral two times daily) Active. RABEprazole Sodium ('20MG'$  Tablet DR, 1 Oral daily) Active. Cetirizine HCl ('10MG'$  Tablet, 1 Oral daily) Active. Systane (0.4-0.3% Gel, 1 Ophthalmic as needed) Active. Ezetimibe ('10MG'$  Tablet, 1 Oral daily) Active. Simvastatin ('20MG'$  Tablet, 1 Oral nightly) Active. Aspirin ('81MG'$  Tablet, 1 Oral daily) Active. Medications Reconciled (verbally fill history verified)  Diagnostic Studies History (April Garrison; 2017-04-28 7:55 AM) Echocardiogram [04/01/2017]: Left ventricle cavity is normal in size. Normal global wall motion. Normal diastolic filling pattern. Calculated EF 67%. Left atrial cavity is normal in size. 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 No evidence of pulmonary hypertension. Treadmill stress test [03/15/2017]: Indication: Syncope & Collapse Resting EKG demonstrates NSR. The patient exercised according to Bruce Protocol, Total time recorded 10:03 min achieving max heart rate of 169 which was 104% of THR for age and 11.83 METS of work. Stress terminated due to fatigue andTHR (>85% MPHR)/MPHR met. There was 2 mm Horizontal ST depression suggestive of ischemia with exercise stress test persisting for > 4 minutes into recovery . There were no significant arrhythmias. Hypertensive BP with resting BP 148/92 mm Hg and peak BP 216/78 mm Hg. Rec: There was 2 mm Horizontal ST depression suggestive of ischemia with exercise stress test persisting for > 4 minutes into recovery . Exercise tolerence is  excellent. Hypertensive BP with resting BP 148/92 mm Hg and peak BP 216/78 mm Hg. No significant arrhythmias. Consider further  cardiac work-up.    Review of Systems [facility] Ender Rorke MD; 04/09/2017 11:42 AM) General Not Present- Appetite Loss and Weight Gain. Respiratory Not Present- Chronic Cough and Wakes up from Sleep Wheezing or Short of Breath. Gastrointestinal Not Present- Black, Tarry Stool and Difficulty Swallowing. Musculoskeletal Not Present- Decreased Range of Motion and Muscle Atrophy. Neurological Not Present- Attention Deficit, Headaches and Syncope. Psychiatric Not Present- Personality Changes and Suicidal Ideation. Endocrine Not Present- Cold Intolerance and Heat Intolerance. Hematology Not Present- Abnormal Bleeding. All other systems negative  Vitals (April Harrington; 04/09/2017 10:29 AM) 04/09/2017 10:28 AM Pulse: 76 (Regular)  P.OX: 98% (Room air) BP: 118/62 (Standing, Right Arm, Standard)    04/09/2017 10:27 AM Pulse: 71 (Regular)  P.OX: 99% (Room air) BP: 112/78 (Sitting, Right Arm, Standard)    04/09/2017 10:21 AM Weight: 219 lb Height: 71in Body Surface Area: 2.19 m Body Mass Index: 30.54 kg/m  Pulse: 68 (Regular)  P.OX: 97% (Room air) BP: 118/78 (Supine, Right Arm, Standard)       Physical Exam Vernell Leep, MD; 04/09/2017 11:52 AM) General Mental Status-Alert. General Appearance-Cooperative and Appears stated age. Build & Nutrition-Moderately built.  Head and Neck Thyroid Gland Characteristics - normal size and consistency and no palpable nodules.  Chest and Lung Exam Chest and lung exam reveals -quiet, even and easy respiratory effort with no use of accessory muscles, non-tender and on auscultation, normal breath sounds, no adventitious sounds.  Cardiovascular Cardiovascular examination reveals -normal heart sounds, regular rate and rhythm with no murmurs, carotid auscultation reveals no bruits, abdominal  aorta auscultation reveals no bruits and no prominent pulsation and femoral artery auscultation bilaterally reveals normal pulses, no bruits, no thrills.  Abdomen Palpation/Percussion Normal exam - Non Tender and No hepatosplenomegaly.  Peripheral Vascular Lower Extremity Palpation - Dorsalis pedis pulse - Bilateral - 2+. Posterior tibial pulse - Bilateral - 2+. Carotid arteries - Bilateral-No Carotid bruit.  Neurologic Neurologic evaluation reveals -alert and oriented x 3 with no impairment of recent or remote memory. Motor-Grossly intact without any focal deficits.  Musculoskeletal Global Assessment Left Lower Extremity - no deformities, masses or tenderness, no known fractures. Right Lower Extremity - no deformities, masses or tenderness, no known fractures.   Results William Gaskins Augusta Mirkin MD; 04/09/2017 11:52 AM) Procedures  Name Value Date Echocardiography, transthoracic, real-time with image documentation (2D), includes M-mode recording, when performed, complete, with spectral Doppler echocardiography, and with color flow Doppler echocardiography (51884) Comments: Echocardiogram 04/01/2017: Left ventricle cavity is normal in size. Normal global wall motion. Normal diastolic filling pattern. Calculated EF 67%. Left atrial cavity is normal in size. 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 No evidence of pulmonary hypertension.  Performed: 04/01/2017 12:11 PM    Assessment & Plan William Gaskins Thelmer Legler MD; 04/09/2017 11:52 AM) Abnormal stress test (R94.39) Current Plans Started Metoprolol Succinate ER '25MG'$ ,  Tablet once daily, #30, 30 days starting 04/09/2017, Ref. x1. Future Plans 16/12/628: METABOLIC PANEL, BASIC (16010) - one time 04/19/2017: CBC & PLATELETS (AUTO) (93235) - one time 04/19/2017: PT (PROTHROMBIN TIME) (57322) - one time Note:Recommendations:  Incidental finding of abnormal EKG treadmill stress test  in 56 y/o man with hypertension, hyperliidemia. No recurrence of syncope.  I discussed several options with the patient, including medical therapy alone, noninvasive testing, or proceeding with coronary angiography to establish his coronary anatomy. He has been anxious about the findings. I told him that coronary angiography will be the most definitive way to obtain this information. I will have high threshiold for  angioplasty as he is otherwise asymptomatic. Nevertheless, I have discussed with him the possibility of revascularization should we find any critical stenosis. If necessary and feasible, he would prefer percutaneous revascularization. In the meantime, I am going to start him on low dose metoprolol succinate 12.5 mg. 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 He would like to continue diet and lifestyle changes before making further changes to his lipid lowering therapy.  Cc Jani Gravel, MD  Signed electronically by Vernell Leep, MD (04/09/2017 11:53 AM)

## 2017-04-25 NOTE — Progress Notes (Signed)
Labs 04/22/2017 BUN/Cr 20/1.18 eGFR 68 Na 140, K 4.2 H/H 15.4/44.3 Platelets 212 INR 1.0

## 2017-04-27 ENCOUNTER — Observation Stay (HOSPITAL_COMMUNITY)
Admission: RE | Admit: 2017-04-27 | Discharge: 2017-04-28 | Disposition: A | Discharge status: Home / Self Care | Payer: BLUE CROSS/BLUE SHIELD | Source: Ambulatory Visit | Attending: Cardiology | Admitting: Cardiology

## 2017-04-27 ENCOUNTER — Encounter (HOSPITAL_COMMUNITY): Payer: Self-pay | Admitting: General Practice

## 2017-04-27 ENCOUNTER — Ambulatory Visit (HOSPITAL_COMMUNITY)
Admission: RE | Disposition: A | Discharge status: Home / Self Care | Payer: Self-pay | Source: Ambulatory Visit | Attending: Cardiology

## 2017-04-27 DIAGNOSIS — R9439 Abnormal result of other cardiovascular function study: Secondary | ICD-10-CM | POA: Diagnosis present

## 2017-04-27 DIAGNOSIS — I10 Essential (primary) hypertension: Secondary | ICD-10-CM | POA: Insufficient documentation

## 2017-04-27 DIAGNOSIS — I25119 Atherosclerotic heart disease of native coronary artery with unspecified angina pectoris: Principal | ICD-10-CM | POA: Insufficient documentation

## 2017-04-27 DIAGNOSIS — G4730 Sleep apnea, unspecified: Secondary | ICD-10-CM | POA: Diagnosis not present

## 2017-04-27 DIAGNOSIS — Z79899 Other long term (current) drug therapy: Secondary | ICD-10-CM | POA: Insufficient documentation

## 2017-04-27 DIAGNOSIS — Z955 Presence of coronary angioplasty implant and graft: Secondary | ICD-10-CM | POA: Diagnosis not present

## 2017-04-27 DIAGNOSIS — R55 Syncope and collapse: Secondary | ICD-10-CM | POA: Insufficient documentation

## 2017-04-27 DIAGNOSIS — I25118 Atherosclerotic heart disease of native coronary artery with other forms of angina pectoris: Secondary | ICD-10-CM | POA: Diagnosis present

## 2017-04-27 DIAGNOSIS — Z7982 Long term (current) use of aspirin: Secondary | ICD-10-CM | POA: Diagnosis not present

## 2017-04-27 DIAGNOSIS — K219 Gastro-esophageal reflux disease without esophagitis: Secondary | ICD-10-CM | POA: Insufficient documentation

## 2017-04-27 DIAGNOSIS — Z79891 Long term (current) use of opiate analgesic: Secondary | ICD-10-CM | POA: Diagnosis not present

## 2017-04-27 DIAGNOSIS — E785 Hyperlipidemia, unspecified: Secondary | ICD-10-CM | POA: Insufficient documentation

## 2017-04-27 HISTORY — DX: Major depressive disorder, single episode, unspecified: F32.9

## 2017-04-27 HISTORY — DX: Family history of other specified conditions: Z84.89

## 2017-04-27 HISTORY — PX: CORONARY STENT INTERVENTION: CATH118234

## 2017-04-27 HISTORY — PX: CORONARY ANGIOPLASTY WITH STENT PLACEMENT: SHX49

## 2017-04-27 HISTORY — DX: Gastro-esophageal reflux disease without esophagitis: K21.9

## 2017-04-27 HISTORY — DX: Obstructive sleep apnea (adult) (pediatric): G47.33

## 2017-04-27 HISTORY — DX: Dependence on other enabling machines and devices: Z99.89

## 2017-04-27 HISTORY — DX: Other chronic pain: G89.29

## 2017-04-27 HISTORY — PX: LEFT HEART CATH AND CORONARY ANGIOGRAPHY: CATH118249

## 2017-04-27 HISTORY — DX: Exertional heatstroke, initial encounter: T67.02XA

## 2017-04-27 HISTORY — DX: Dorsalgia, unspecified: M54.9

## 2017-04-27 HISTORY — DX: Unspecified osteoarthritis, unspecified site: M19.90

## 2017-04-27 HISTORY — DX: Personal history of other diseases of the digestive system: Z87.19

## 2017-04-27 HISTORY — DX: Depression, unspecified: F32.A

## 2017-04-27 LAB — BASIC METABOLIC PANEL
Anion gap: 10 (ref 5–15)
BUN: 19 mg/dL (ref 6–20)
CALCIUM: 8.7 mg/dL — AB (ref 8.9–10.3)
CO2: 25 mmol/L (ref 22–32)
CREATININE: 1.05 mg/dL (ref 0.61–1.24)
Chloride: 101 mmol/L (ref 101–111)
GFR calc non Af Amer: >60 mL/min (ref >60)
Glucose, Bld: 103 mg/dL — ABNORMAL HIGH (ref 65–99)
Potassium: 3.9 mmol/L (ref 3.5–5.1)
SODIUM: 136 mmol/L (ref 135–145)

## 2017-04-27 LAB — POCT ACTIVATED CLOTTING TIME
ACTIVATED CLOTTING TIME: 411 s
ACTIVATED CLOTTING TIME: 434 s

## 2017-04-27 LAB — TROPONIN I: Troponin I: <0.03 ng/mL (ref <0.03)

## 2017-04-27 SURGERY — LEFT HEART CATH AND CORONARY ANGIOGRAPHY
Anesthesia: LOCAL

## 2017-04-27 MED ORDER — ASPIRIN 81 MG PO CHEW
81.0000 mg | CHEWABLE_TABLET | Freq: Every day | ORAL | Status: DC
  Administered 2017-04-28: 81 mg via ORAL
  Filled 2017-04-27: qty 1

## 2017-04-27 MED ORDER — CLOPIDOGREL BISULFATE 75 MG PO TABS: 75.0000 mg | ORAL_TABLET | Freq: Every day | ORAL | 3 refills | Status: DC

## 2017-04-27 MED ORDER — HEPARIN (PORCINE) IN NACL 2-0.9 UNIT/ML-% IJ SOLN
INTRAMUSCULAR | Status: AC | PRN
  Administered 2017-04-27: 1000 mL

## 2017-04-27 MED ORDER — HEPARIN SODIUM (PORCINE) 1000 UNIT/ML IJ SOLN
INTRAMUSCULAR | Status: DC | PRN
  Administered 2017-04-27: 5000 [IU] via INTRAVENOUS
  Administered 2017-04-27: 3000 [IU] via INTRAVENOUS

## 2017-04-27 MED ORDER — HEPARIN (PORCINE) IN NACL 2-0.9 UNIT/ML-% IJ SOLN
INTRAMUSCULAR | Status: AC
  Filled 2017-04-27: qty 500

## 2017-04-27 MED ORDER — SODIUM CHLORIDE 0.9 % IV SOLN: 250.0000 mL | INTRAVENOUS | Status: DC | PRN

## 2017-04-27 MED ORDER — OXYCODONE-ACETAMINOPHEN 5-325 MG PO TABS
ORAL_TABLET | ORAL | Status: AC
  Administered 2017-04-27: 1 via ORAL
  Filled 2017-04-27: qty 1

## 2017-04-27 MED ORDER — TRAMADOL HCL 50 MG PO TABS: 50.0000 mg | ORAL_TABLET | Freq: Four times a day (QID) | ORAL | Status: DC | PRN

## 2017-04-27 MED ORDER — SODIUM CHLORIDE 0.9% FLUSH: 3.0000 mL | INTRAVENOUS | Status: DC | PRN

## 2017-04-27 MED ORDER — FENTANYL CITRATE (PF) 100 MCG/2ML IJ SOLN
INTRAMUSCULAR | Status: DC | PRN
  Administered 2017-04-27 (×2): 25 ug via INTRAVENOUS
  Administered 2017-04-27: 50 ug via INTRAVENOUS

## 2017-04-27 MED ORDER — LABETALOL HCL 5 MG/ML IV SOLN: 10.0000 mg | INTRAVENOUS | Status: AC | PRN

## 2017-04-27 MED ORDER — IOPAMIDOL (ISOVUE-370) INJECTION 76%
INTRAVENOUS | Status: AC
  Filled 2017-04-27: qty 50

## 2017-04-27 MED ORDER — ROSUVASTATIN CALCIUM 20 MG PO TABS
20.0000 mg | ORAL_TABLET | Freq: Every day | ORAL | 11 refills | Status: DC
Start: 2017-04-27 — End: 2021-03-19

## 2017-04-27 MED ORDER — METOPROLOL SUCCINATE ER 25 MG PO TB24
ORAL_TABLET | ORAL | Status: AC
  Administered 2017-04-27: 25 mg via ORAL
  Filled 2017-04-27: qty 1

## 2017-04-27 MED ORDER — HEPARIN (PORCINE) IN NACL 2-0.9 UNIT/ML-% IJ SOLN
INTRAMUSCULAR | Status: AC
  Filled 2017-04-27: qty 1000

## 2017-04-27 MED ORDER — CLOPIDOGREL BISULFATE 300 MG PO TABS
ORAL_TABLET | ORAL | Status: AC
  Filled 2017-04-27: qty 2

## 2017-04-27 MED ORDER — NITROGLYCERIN 1 MG/10 ML FOR IR/CATH LAB
INTRA_ARTERIAL | Status: DC | PRN
  Administered 2017-04-27 (×4): 200 ug via INTRACORONARY

## 2017-04-27 MED ORDER — SODIUM CHLORIDE 0.9% FLUSH
3.0000 mL | Freq: Two times a day (BID) | INTRAVENOUS | Status: DC
  Administered 2017-04-27: 3 mL via INTRAVENOUS

## 2017-04-27 MED ORDER — ROSUVASTATIN CALCIUM 10 MG PO TABS: 10.0000 mg | ORAL_TABLET | Freq: Every day | ORAL | 11 refills | Status: DC

## 2017-04-27 MED ORDER — IOPAMIDOL (ISOVUE-370) INJECTION 76%
INTRAVENOUS | Status: DC | PRN
  Administered 2017-04-27: 185 mL via INTRA_ARTERIAL

## 2017-04-27 MED ORDER — METOPROLOL TARTRATE 12.5 MG HALF TABLET
ORAL_TABLET | ORAL | Status: AC
  Filled 2017-04-27: qty 2

## 2017-04-27 MED ORDER — METOPROLOL SUCCINATE ER 25 MG PO TB24
25.0000 mg | ORAL_TABLET | Freq: Every day | ORAL | Status: DC
  Administered 2017-04-27 – 2017-04-28 (×2): 25 mg via ORAL
  Filled 2017-04-27: qty 1

## 2017-04-27 MED ORDER — POLYVINYL ALCOHOL 1.4 % OP SOLN
1.0000 [drp] | Freq: Every day | OPHTHALMIC | Status: DC | PRN
  Filled 2017-04-27: qty 15

## 2017-04-27 MED ORDER — FENTANYL CITRATE (PF) 100 MCG/2ML IJ SOLN
INTRAMUSCULAR | Status: AC
  Filled 2017-04-27: qty 2

## 2017-04-27 MED ORDER — ASPIRIN 81 MG PO CHEW
81.0000 mg | CHEWABLE_TABLET | ORAL | Status: AC
  Administered 2017-04-27: 81 mg via ORAL

## 2017-04-27 MED ORDER — SODIUM CHLORIDE 0.9 % IV SOLN
INTRAVENOUS | Status: AC
  Administered 2017-04-27: 08:00:00 via INTRAVENOUS

## 2017-04-27 MED ORDER — LIDOCAINE HCL 2 % IJ SOLN
INTRAMUSCULAR | Status: AC
  Filled 2017-04-27: qty 10

## 2017-04-27 MED ORDER — ASPIRIN 81 MG PO CHEW
CHEWABLE_TABLET | ORAL | Status: AC
  Filled 2017-04-27: qty 1

## 2017-04-27 MED ORDER — DIAZEPAM 5 MG PO TABS: 5.0000 mg | ORAL_TABLET | Freq: Two times a day (BID) | ORAL | Status: DC | PRN

## 2017-04-27 MED ORDER — ACETAMINOPHEN 325 MG PO TABS: 650.0000 mg | ORAL_TABLET | ORAL | Status: DC | PRN

## 2017-04-27 MED ORDER — LIDOCAINE HCL (PF) 1 % IJ SOLN
INTRAMUSCULAR | Status: DC | PRN
  Administered 2017-04-27: 1 mL

## 2017-04-27 MED ORDER — ONDANSETRON HCL 4 MG/2ML IJ SOLN: 4.0000 mg | Freq: Four times a day (QID) | INTRAMUSCULAR | Status: DC | PRN

## 2017-04-27 MED ORDER — SODIUM CHLORIDE 0.9 % IV SOLN: INTRAVENOUS | Status: AC

## 2017-04-27 MED ORDER — SODIUM CHLORIDE 0.9% FLUSH: 3.0000 mL | Freq: Two times a day (BID) | INTRAVENOUS | Status: DC

## 2017-04-27 MED ORDER — MIDAZOLAM HCL 2 MG/2ML IJ SOLN
INTRAMUSCULAR | Status: DC | PRN
  Administered 2017-04-27: 1 mg via INTRAVENOUS
  Administered 2017-04-27: 2 mg via INTRAVENOUS
  Administered 2017-04-27: 1 mg via INTRAVENOUS

## 2017-04-27 MED ORDER — FLUTICASONE PROPIONATE 50 MCG/ACT NA SUSP
1.0000 | Freq: Every day | NASAL | Status: DC
  Administered 2017-04-27: 1 via NASAL
  Filled 2017-04-27: qty 16

## 2017-04-27 MED ORDER — ADENOSINE 12 MG/4ML IV SOLN
INTRAVENOUS | Status: AC
  Filled 2017-04-27: qty 16

## 2017-04-27 MED ORDER — DULOXETINE HCL 30 MG PO CPEP
30.0000 mg | ORAL_CAPSULE | Freq: Two times a day (BID) | ORAL | Status: DC
  Administered 2017-04-28: 30 mg via ORAL
  Filled 2017-04-27 (×2): qty 1

## 2017-04-27 MED ORDER — CLOPIDOGREL BISULFATE 75 MG PO TABS
75.0000 mg | ORAL_TABLET | Freq: Every day | ORAL | Status: DC
  Administered 2017-04-28 (×2): 75 mg via ORAL
  Filled 2017-04-27: qty 1

## 2017-04-27 MED ORDER — VERAPAMIL HCL 2.5 MG/ML IV SOLN
INTRAVENOUS | Status: DC | PRN
  Administered 2017-04-27 (×2): 5 mL via INTRA_ARTERIAL

## 2017-04-27 MED ORDER — NITROGLYCERIN 1 MG/10 ML FOR IR/CATH LAB
INTRA_ARTERIAL | Status: AC
  Filled 2017-04-27: qty 10

## 2017-04-27 MED ORDER — IOPAMIDOL (ISOVUE-370) INJECTION 76%
INTRAVENOUS | Status: AC
  Filled 2017-04-27: qty 100

## 2017-04-27 MED ORDER — ANGIOPLASTY BOOK
Freq: Once | Status: AC
  Administered 2017-04-27: 22:00:00
  Filled 2017-04-27: qty 1

## 2017-04-27 MED ORDER — MIDAZOLAM HCL 2 MG/2ML IJ SOLN
INTRAMUSCULAR | Status: AC
  Filled 2017-04-27: qty 2

## 2017-04-27 MED ORDER — VERAPAMIL HCL 2.5 MG/ML IV SOLN
INTRAVENOUS | Status: AC
  Filled 2017-04-27: qty 2

## 2017-04-27 MED ORDER — OXYCODONE-ACETAMINOPHEN 5-325 MG PO TABS
1.0000 | ORAL_TABLET | Freq: Once | ORAL | Status: AC
  Administered 2017-04-27: 1 via ORAL

## 2017-04-27 MED ORDER — HEPARIN SODIUM (PORCINE) 1000 UNIT/ML IJ SOLN
INTRAMUSCULAR | Status: AC
  Filled 2017-04-27: qty 1

## 2017-04-27 MED ORDER — OXAPROZIN 600 MG PO TABS
600.0000 mg | ORAL_TABLET | Freq: Two times a day (BID) | ORAL | Status: DC
  Filled 2017-04-27 (×3): qty 1

## 2017-04-27 MED ORDER — PANTOPRAZOLE SODIUM 40 MG PO TBEC
40.0000 mg | DELAYED_RELEASE_TABLET | Freq: Every day | ORAL | Status: DC
  Administered 2017-04-27 – 2017-04-28 (×2): 40 mg via ORAL
  Filled 2017-04-27 (×2): qty 1

## 2017-04-27 MED ORDER — OXYCODONE-ACETAMINOPHEN 5-325 MG PO TABS: 1.0000 | ORAL_TABLET | Freq: Four times a day (QID) | ORAL | Status: DC | PRN

## 2017-04-27 MED ORDER — CLOPIDOGREL BISULFATE 300 MG PO TABS
ORAL_TABLET | ORAL | Status: DC | PRN
  Administered 2017-04-27: 600 mg via ORAL

## 2017-04-27 MED ORDER — EZETIMIBE 10 MG PO TABS
10.0000 mg | ORAL_TABLET | Freq: Every day | ORAL | Status: DC
  Administered 2017-04-28: 10 mg via ORAL
  Filled 2017-04-27: qty 1

## 2017-04-27 SURGICAL SUPPLY — 20 items
BALLN EUPHORA RX 2.5X10 (BALLOONS) ×2
BALLN SAPPHIRE ~~LOC~~ 3.5X10 (BALLOONS) ×1 IMPLANT
BALLOON EUPHORA RX 2.5X10 (BALLOONS) IMPLANT
CATH IMPULSE 5F ANG/FL3.5 (CATHETERS) ×1 IMPLANT
CATH MICROCATH NAVVUS (MICROCATHETER) IMPLANT
CATH VISTA GUIDE 6FR XB3.5 (CATHETERS) ×1 IMPLANT
DEVICE RAD COMP TR BAND LRG (VASCULAR PRODUCTS) ×1 IMPLANT
GLIDESHEATH SLEND A-KIT 6F 22G (SHEATH) ×1 IMPLANT
GUIDEWIRE INQWIRE 1.5J.035X260 (WIRE) IMPLANT
INQWIRE 1.5J .035X260CM (WIRE) ×2
KIT ENCORE 26 ADVANTAGE (KITS) ×1 IMPLANT
KIT ESSENTIALS PG (KITS) ×1 IMPLANT
KIT HEART LEFT (KITS) ×2 IMPLANT
MICROCATHETER NAVVUS (MICROCATHETER) ×2
PACK CARDIAC CATHETERIZATION (CUSTOM PROCEDURE TRAY) ×2 IMPLANT
STENT SYNERGY DES 3.5X12 (Permanent Stent) ×1 IMPLANT
STENT SYNERGY DES 3X12 (Permanent Stent) ×1 IMPLANT
TRANSDUCER W/STOPCOCK (MISCELLANEOUS) ×2 IMPLANT
TUBING CIL FLEX 10 FLL-RA (TUBING) ×2 IMPLANT
WIRE ASAHI PROWATER 180CM (WIRE) ×1 IMPLANT

## 2017-04-27 NOTE — Interval H&P Note (Signed)
History and Physical Interval Note:  04/27/2017 11:32 AM  William Mason  has presented today for surgery, with the diagnosis of Abnormal Stress Test  The various methods of treatment have been discussed with the patient and family. After consideration of risks, benefits and other options for treatment, the patient has consented to  Procedure(s): LEFT HEART CATH AND CORONARY ANGIOGRAPHY (N/A) CORONARY STENT INTERVENTION (N/A) as a surgical intervention .  The patient's history has been reviewed, patient examined, no change in status, stable for surgery.  I have reviewed the patient's chart and labs.  Questions were answered to the patient's satisfaction.    Corrected AUC:  1 Vessel Disease PCI CABG   No proximal LAD involvement, No proximal left dominant LCX involvement M (4); Indication 2 R (3); Indication 2   Proximal left dominant LCX involvement M (5); Indication 5 M (5); Indication 5   Proximal LAD involvement M (5); Indication 5 M (5); Indication 5   newline 2 Vessel Disease  No proximal LAD involvement M (5); Indication 8 M (4); Indication 8   Proximal LAD involvement M (6); Indication 11 M (6); Indication 11   newline 3 Vessel Disease  Low disease complexity (e.g., focal stenoses, SYNTAX <=22) M (6); Indication 17 A (7); Indication 17   Intermediate or high disease complexity (e.g., SYNTAX >=23) M (5); Indication 21 A (7); Indication 21   newline 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 (6); Indication 24 A (8); Indication 24   Isolated LMCA disease: bifurcation involvement M (5); Indication 25 A (8); Indication 25   LMCA ostial or midshaft, concurrent low disease burden multivessel disease (e.g., 1-2 additional focal stenoses, SYNTAX <=22) M (6); Indication 26 A (8); Indication 26   LMCA ostial or midshaft, concurrent intermediate or high disease burden multivessel disease (e.g., 1-2 additional bifurcation stenoses, long stenoses, SYNTAX >=23) M (4); Indication  27 A (9); Indication 27   LMCA bifurcation involvement, concurrent low disease burden multivessel disease (e.g., 1-2 additional focal stenoses, SYNTAX <=22) M (4); Indication 28 A (8); Indication 28   LMCA bifurcation involvement, concurrent intermediate or high disease burden multivessel disease (e.g., 1-2 additional bifurcation stenoses, long stenoses, SYNTAX >=23) R (3); Indication 29 A (8); Indication [place]

## 2017-04-27 NOTE — Interval H&P Note (Signed)
History and Physical Interval Note:  04/27/2017 10:08 AM  William Mason  has presented today for surgery, with the diagnosis of Abnormal Stress Test  The various methods of treatment have been discussed with the patient and family. After consideration of risks, benefits and other options for treatment, the patient has consented to  Procedure(s): LEFT HEART CATH AND CORONARY ANGIOGRAPHY (N/A) as a surgical intervention .  The patient's history has been reviewed, patient examined, no change in status, stable for surgery.  I have reviewed the patient's chart and labs.  Questions were answered to the patient's satisfaction.     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 (6); Indication 24 A (8); Indication 24  Isolated LMCA disease: bifurcation involvement M (5); Indication 25 A (8); Indication 25  LMCA ostial or midshaft, concurrent low disease burden multivessel disease (e.g., 1-2 additional focal stenoses, SYNTAX <=22) M (6); Indication 26 A (8); Indication 26  LMCA ostial or midshaft, concurrent intermediate or high disease burden multivessel disease (e.g., 1-2 additional bifurcation stenoses, long stenoses, SYNTAX >=23) M (4); Indication 27 A (9); Indication 27  LMCA bifurcation involvement, concurrent low disease burden multivessel disease (e.g., 1-2 additional focal stenoses, SYNTAX <=22) M (4); Indication 28 A (8); Indication 28  LMCA bifurcation involvement, concurrent intermediate or high disease burden multivessel disease (e.g., 1-2 additional bifurcation stenoses, long stenoses, SYNTAX >=23) R (3); Indication 29 A (8); Indication [place]

## 2017-04-27 NOTE — Progress Notes (Signed)
Placed pt on CPAP at this time. Pt tolerating well, RT will continue to monitor.

## 2017-04-27 NOTE — Progress Notes (Signed)
CARDIAC REHAB PHASE I  Pt for same day discharge post PCI. Completed PCI/stent education.  Reviewed risk factors, PCI book, anti-platelet therapy, stent card, activity restrictions, ntg, exercise, heart healthy diet and phase 2 cardiac rehab. Pt verbalized understanding, receptive to education. Pt agrees to phase 2 cardiac rehab referral, will send to [facility] per pt request. Pt in recliner, call bell within reach.   [id number] Lenna Sciara, RN, BSN 04/27/2017 2:06 PM

## 2017-04-28 ENCOUNTER — Encounter (HOSPITAL_COMMUNITY): Payer: Self-pay | Admitting: Cardiology

## 2017-04-28 ENCOUNTER — Telehealth (HOSPITAL_COMMUNITY): Payer: Self-pay

## 2017-04-28 DIAGNOSIS — I10 Essential (primary) hypertension: Secondary | ICD-10-CM | POA: Diagnosis not present

## 2017-04-28 DIAGNOSIS — Z955 Presence of coronary angioplasty implant and graft: Secondary | ICD-10-CM | POA: Diagnosis not present

## 2017-04-28 DIAGNOSIS — I25119 Atherosclerotic heart disease of native coronary artery with unspecified angina pectoris: Secondary | ICD-10-CM | POA: Diagnosis not present

## 2017-04-28 DIAGNOSIS — R55 Syncope and collapse: Secondary | ICD-10-CM | POA: Diagnosis not present

## 2017-04-28 LAB — CBC
HCT: 46.8 % (ref 39.0–52.0)
Hemoglobin: 15.3 g/dL (ref 13.0–17.0)
MCH: 30.6 pg (ref 26.0–34.0)
MCHC: 32.7 g/dL (ref 30.0–36.0)
MCV: 93.6 fL (ref 78.0–100.0)
Platelets: 206 10*3/uL (ref 150–400)
RBC: 5 MIL/uL (ref 4.22–5.81)
RDW: 13.8 % (ref 11.5–15.5)
WBC: 6.7 10*3/uL (ref 4.0–10.5)

## 2017-04-28 MED ORDER — PANTOPRAZOLE SODIUM 40 MG PO TBEC: 40.0000 mg | DELAYED_RELEASE_TABLET | Freq: Every day | ORAL | 6 refills | Status: DC

## 2017-04-28 MED ORDER — ASPIRIN 81 MG PO CHEW: 81.0000 mg | CHEWABLE_TABLET | Freq: Every day | ORAL | 6 refills | Status: DC

## 2017-04-28 MED FILL — Lidocaine HCl Local Inj 2%: INTRAMUSCULAR | Qty: 10 | Status: AC

## 2017-04-28 NOTE — Telephone Encounter (Signed)
Verified insurance. No co-pay, deductible amount is $1,200/$1,200 has been met, out of pocket amount is $3,200/$1,915.95 has been met, no co-insurance, and no pre-authorization is required. Passport/reference # [id number]

## 2017-04-28 NOTE — Care Management Note (Signed)
Case Management Note  Patient Details  Name: Ashawn Rinehart MRN: [id number] Date of Birth: 08-17-1960  Subjective/Objective:   From home with wife, s/p coronary stent intervention, will be  On plavix, has ambulated 1000 ft with cardiac rehab.                 Action/Plan: NCM will follow for dc needs.  Expected Discharge Date:                  Expected Discharge Plan:  Home/Self Care  In-House Referral:     Discharge planning Services  CM Consult  Post Acute Care Choice:    Choice offered to:     DME Arranged:    DME Agency:     HH Arranged:    [place] Agency:     Status of Service:  Completed, signed off  If discussed at [employer] of Stay Meetings, dates discussed:    Additional Comments:  Zenon Mayo, RN 04/28/2017, 9:00 AM

## 2017-04-28 NOTE — Progress Notes (Addendum)
[id number] Pt just took walk with NT but stated he felt like he could walk again. We walked 1000 ft with steady gait. BP 119/80, 100 %RA sat and heart rate 87. As pt rested he had some trigeminy PVCS and then occasional PVCs that lessened with rest. No CP but stated arm a little numb. He thinks that is from shoulder issues that he has. Tired and a little SOB after walk but sats good. No questions re ed. Graylon Good RN BSN 04/28/2017 8:58 AM

## 2017-04-28 NOTE — Discharge Summary (Signed)
Physician Discharge Summary  Patient ID: William Mason MRN: [id number] DOB/AGE: 58-Apr-1962 56 y.o.  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  Resting.  PDA was 0.79 suggesting physiologically significant stenosis.  He underwent 2 overlapping drug-eluting stents for the same.  Postprocedure patient had an episode of chest pain while trying to get out of the recliner.  His for short lasting and was also after.  However, given concern for angina and he was observed overnight.  He did not have any EKG changes suggestive of ischemia.  Troponin was negative.  This suggests that the episode episode was more musculoskeletal true angina.  Patient did have occasional ventricular trigeminy only at rest and resolved with walking. Electrolytes are stable. He worked with cardiac rehabilitation Increase his metoprolol succinate.  On discharge, his and states were stopped and his PPI was switched to pantoprazole.  He'll be on aspirin 81 mg lifelong and Plavix 75 mg at least for 6 months.  I will see him for follow-up next week.   Consults: None  Significant Diagnostic Studies:  Coronary angiogram and angioplasty  Treatments: Coronary angioplasty  Discharge Exam: Blood pressure 125/74, pulse 78, temperature 98.2 F (36.8 C), temperature source Oral, resp. rate 16, height 5\' 11"  (1.803 m), weight 95 kg (209 lb 7 oz), SpO2 96 %.  General Mental Status-Alert. General Appearance-Cooperative and Appears stated age. Build & Nutrition-Moderately  built.  Head and Neck Thyroid Gland Characteristics - normal size and consistency and no palpable nodules.  Chest and Lung Exam Chest and lung exam reveals -quiet, even and easy respiratory effort with no use of accessory muscles, non-tender and on auscultation, normal breath sounds, no adventitious sounds.  Cardiovascular Cardiovascular examination reveals -normal heart sounds, regular rate and rhythm with no murmurs, carotid auscultation reveals no bruits, abdominal aorta auscultation reveals no bruits and no prominent pulsation and femoral artery auscultation bilaterally reveals normal pulses, no bruits, no thrills.  Abdomen Palpation/Percussion Normal exam - Non Tender and No hepatosplenomegaly.  Peripheral Vascular Lower Extremity Palpation - Dorsalis pedis pulse - Bilateral - 2+. Posterior tibial pulse - Bilateral - 2+. Carotid arteries - Bilateral-No Carotid bruit. Right radial cath site with no bleeding or hematoma. Neurologic Neurologic evaluation reveals -alert and oriented x 3 with no impairment of recent or remote memory. Motor-Grossly intact without any focal deficits.  Musculoskeletal Global Assessment Left Lower Extremity - no deformities, masses or tenderness, no known fractures. Right Lower Extremity - no deformities, masses or tenderness, no known fractures.   Disposition: 01-Home or Self Care  Discharge Instructions    AMB Referral to Cardiac Rehabilitation - Phase II    Complete by:  As directed    Diagnosis:  PTCA   Amb Referral to Cardiac Rehabilitation    Complete by:  As directed    Diagnosis:  Coronary Stents   Call MD for:  difficulty breathing, headache or visual disturbances    Complete by:  As directed    Call MD for:  redness, tenderness, or signs of infection (pain, swelling, redness, odor or green/yellow discharge around incision site)  Complete by:  As directed    Call MD for:  severe uncontrolled pain    Complete by:  As  directed    Diet - low sodium heart healthy    Complete by:  As directed    Increase activity slowly    Complete by:  As directed      Allergies as of 04/28/2017      Reactions   Vicodin [hydrocodone-acetaminophen] Nausea Only      Medication List    STOP taking these medications   aspirin EC 81 MG tablet Replaced by:  aspirin 81 MG chewable tablet   lovastatin 10 MG tablet Commonly known as:  MEVACOR   oxaprozin 600 MG tablet Commonly known as:  DAYPRO   RABEprazole 20 MG tablet Commonly known as:  ACIPHEX Replaced by:  pantoprazole 40 MG tablet     TAKE these medications   aspirin 81 MG chewable tablet Chew 1 tablet (81 mg total) by mouth daily. Replaces:  aspirin EC 81 MG tablet   cetirizine 10 MG tablet Commonly known as:  ZYRTEC Take 10 mg by mouth daily.   clopidogrel 75 MG tablet Commonly known as:  PLAVIX Take 1 tablet (75 mg total) by mouth daily.   diazepam 5 MG tablet Commonly known as:  VALIUM Take 5 mg by mouth 2 (two) times daily as needed for anxiety.   DULoxetine 30 MG capsule Commonly known as:  CYMBALTA Take 30 mg by mouth 2 (two) times daily.   ezetimibe 10 MG tablet Commonly known as:  ZETIA Take 10 mg by mouth daily.   fluticasone 50 MCG/ACT nasal spray Commonly known as:  FLONASE Place 1 spray into both nostrils at bedtime.   metoprolol succinate 25 MG 24 hr tablet Commonly known as:  TOPROL-XL Take 12.5 mg by mouth daily.   oxyCODONE-acetaminophen 5-325 MG tablet Commonly known as:  PERCOCET/ROXICET Take 1 tablet by mouth every 6 (six) hours as needed for severe pain.   pantoprazole 40 MG tablet Commonly known as:  PROTONIX Take 1 tablet (40 mg total) by mouth daily. Replaces:  RABEprazole 20 MG tablet   rosuvastatin 20 MG tablet Commonly known as:  CRESTOR Take 1 tablet (20 mg total) by mouth at bedtime.   SKELAXIN 800 MG tablet Generic drug:  metaxalone Take 800 mg by mouth daily as needed for muscle spasms.    SYSTANE ULTRA 0.4-0.3 % Soln Generic drug:  Polyethyl Glycol-Propyl Glycol Place 1-2 drops into both eyes daily as needed (eye irritation).   traMADol 50 MG tablet Commonly known as:  ULTRAM Take 50 mg by mouth every 6 (six) hours as needed for moderate pain.      Follow-up Information    Nigel Mormon, MD Follow up on 05/05/2017.   Specialty:  Cardiology Why:  2:30 PM Contact information: [place] [phone]           Signed: Nigel Mormon 04/28/2017, 10:00 AM

## 2017-05-06 ENCOUNTER — Telehealth (HOSPITAL_COMMUNITY): Payer: Self-pay

## 2017-05-06 ENCOUNTER — Encounter (HOSPITAL_COMMUNITY): Payer: Self-pay

## 2017-05-06 NOTE — Telephone Encounter (Signed)
I called and left message on voicemail to call office about scheduling for cardiac rehab. I left office contact information on patient voicemail to return call.  ° °

## 2017-05-10 ENCOUNTER — Telehealth (HOSPITAL_COMMUNITY): Payer: Self-pay

## 2017-05-10 NOTE — Telephone Encounter (Signed)
UPDATED: Verified insurance. No co-payment, deductible is $1,200/$1,200 has been met, out of pocket amount is $3,200/$1,956.43 has been met, no co-insurance, and no pre-authorization is required. Passport/reference # [id number]

## 2017-05-10 NOTE — Telephone Encounter (Signed)
Cardiac Rehab Medication Review by a Pharmacist  Does the patient  feel that his/her medications are working for him/her?  yes  Has the patient been experiencing any side effects to the medications prescribed?  no  Does the patient measure his/her own blood pressure or blood glucose at home?  yes   Does the patient have any problems obtaining medications due to transportation or finances?   no  Understanding of regimen: good Understanding of indications: good Potential of compliance: good    Pharmacist comments: Patient has a good understanding of his medications. He expressed some concern over his chronic back pain. He wanted to know if he could take ibuprofen. I advised him to avoid ibuprofen on a regular basis as it can affect blood pressure and kidney function. Patient takes his blood pressure at home and it is usually within normal limits.     Susa Raring, PharmD PGY2 Infectious Diseases Pharmacy Resident Pager: [phone]  05/10/2017 3:55 PM

## 2017-05-10 NOTE — Telephone Encounter (Signed)
Patient contacted me after contacting his insurance company. Patient was informed that he requires pre-authorization for cardiac rehab. Patient informed me that I did not need to call him back, no unless things changed. Patient benefits were verified prior to him coming in the office. Patient was informed that he did not need pre-authorization and I will personally call to speak with someone at [facility]. I contacted [place] spoke with [facility] @ [place] [phone] does not need pre-certification - reference [id number].

## 2017-05-11 ENCOUNTER — Encounter (HOSPITAL_COMMUNITY)
Admission: RE | Admit: 2017-05-11 | Discharge: 2017-05-11 | Disposition: A | Discharge status: Home / Self Care | Payer: BLUE CROSS/BLUE SHIELD | Source: Ambulatory Visit | Attending: Cardiology | Admitting: Cardiology

## 2017-05-11 VITALS — BP 118/62 | HR 90 | Ht 70.0 in | Wt 215.2 lb

## 2017-05-11 DIAGNOSIS — Z955 Presence of coronary angioplasty implant and graft: Secondary | ICD-10-CM | POA: Insufficient documentation

## 2017-05-11 DIAGNOSIS — Z48812 Encounter for surgical aftercare following surgery on the circulatory system: Secondary | ICD-10-CM | POA: Insufficient documentation

## 2017-05-11 NOTE — Progress Notes (Signed)
Cardiac Individual Treatment Plan  Patient Details  Name: William Mason MRN: [id number] Date of Birth: 1960/10/22 Referring Provider:     [facility] from 05/11/2017 in [place]  Referring Provider  Vernell Leep MD      Initial Encounter Date:    CARDIAC REHAB PHASE II ORIENTATION from 05/11/2017 in [place]  Date  05/11/17  Referring Provider  Vernell Leep MD      Visit Diagnosis: Stented coronary artery  Patient's Home Medications on Admission:  Current Outpatient Prescriptions:  .  aspirin 81 MG chewable tablet, Chew 1 tablet (81 mg total) by mouth daily., Disp: 90 tablet, Rfl: 6 .  cetirizine (ZYRTEC) 10 MG tablet, Take 10 mg by mouth daily. , Disp: , Rfl:  .  clopidogrel (PLAVIX) 75 MG tablet, Take 1 tablet (75 mg total) by mouth daily., Disp: 90 tablet, Rfl: 3 .  diazepam (VALIUM) 5 MG tablet, Take 5 mg by mouth 2 (two) times daily as needed for anxiety., Disp: , Rfl:  .  DULoxetine (CYMBALTA) 30 MG capsule, Take 30 mg by mouth 2 (two) times daily. , Disp: , Rfl:  .  ezetimibe (ZETIA) 10 MG tablet, Take 10 mg by mouth daily., Disp: , Rfl:  .  fluticasone (FLONASE) 50 MCG/ACT nasal spray, Place 1 spray into both nostrils at bedtime., Disp: , Rfl:  .  metaxalone (SKELAXIN) 800 MG tablet, Take 800 mg by mouth daily as needed for muscle spasms. , Disp: , Rfl:  .  metoprolol succinate (TOPROL-XL) 25 MG 24 hr tablet, Take 37.5 mg by mouth daily. , Disp: , Rfl:  .  oxyCODONE-acetaminophen (PERCOCET/ROXICET) 5-325 MG tablet, Take 1 tablet by mouth every 6 (six) hours as needed for severe pain., Disp: , Rfl:  .  pantoprazole (PROTONIX) 40 MG tablet, Take 1 tablet (40 mg total) by mouth daily., Disp: 30 tablet, Rfl: 6 .  Polyethyl Glycol-Propyl Glycol (SYSTANE ULTRA) 0.4-0.3 % SOLN, Place 1-2 drops into both eyes daily as needed (eye irritation)., Disp: , Rfl:  .  rosuvastatin  (CRESTOR) 20 MG tablet, Take 1 tablet (20 mg total) by mouth at bedtime., Disp: 30 tablet, Rfl: 11 .  traMADol (ULTRAM) 50 MG tablet, Take 50 mg by mouth every 6 (six) hours as needed for moderate pain., Disp: , Rfl:   Past Medical History: Past Medical History:  Diagnosis Date  . Arthritis    "knees, shoulders, back" (04/27/2017)  . Chronic back pain    "5 ruptured discs in the middle; 1 ruptured disc in the lower" (04/27/2017)  . Depression   . Diverticulitis   . Exertional heat stroke ~ 01/2017  . Family history of adverse reaction to anesthesia    "mom had emergency appy at age 80; she had severe short term memory loss; never recovered"  . GERD (gastroesophageal reflux disease)   . History of hiatal hernia   . Hyperlipidemia   . OSA on CPAP     Tobacco Use: History  Smoking Status  . Never Smoker  Smokeless Tobacco  . Former Systems developer  . Types: Chew  . Quit date: 72    Labs: Recent Review Flowsheet Data    There is no flowsheet data to display.      Capillary Blood Glucose: No results found for: GLUCAP   Exercise Target Goals: Date: 05/11/17  Exercise Program Goal: Individual exercise prescription set with THRR, safety & activity barriers. Participant demonstrates ability to understand  and report RPE using BORG scale, to self-measure pulse accurately, and to acknowledge the importance of the exercise prescription.  Exercise Prescription Goal: Starting with aerobic activity 30 plus minutes a day, 3 days per week for initial exercise prescription. Provide home exercise prescription and guidelines that participant acknowledges understanding prior to discharge.  Activity Barriers & Risk Stratification:     Activity Barriers & Cardiac Risk Stratification - 05/11/17 1515      Activity Barriers & Cardiac Risk Stratification   Cardiac Risk Stratification Moderate      6 Minute Walk:     6 Minute Walk    Row Name 05/11/17 1510         6 Minute Walk   Phase  Initial     Distance 1800 feet     Walk Time 6 minutes     # of Rest Breaks 0     MPH 4.43     METS 3.4     RPE 12     Symptoms No     Resting HR 90 bpm     Resting BP 118/62     Resting Oxygen Saturation  96 %     Exercise Oxygen Saturation  during 6 min walk 95 %     Max Ex. HR 115 bpm     Max Ex. BP 134/80     2 Minute Post BP 106/62        Oxygen Initial Assessment:   Oxygen Re-Evaluation:   Oxygen Discharge (Final Oxygen Re-Evaluation):   Initial Exercise Prescription:     Initial Exercise Prescription - 05/11/17 1500      Date of Initial Exercise RX and Referring Provider   Date 05/11/17   Referring Provider Vernell Leep MD     Treadmill   MPH 3   Grade 1   Minutes 10   METs 3.71     Bike   Level 1   Minutes 10   METs 2.99     NuStep   Level 3   SPM 80   Minutes 10   METs 3     Prescription Details   Frequency (times per week) 3   Duration Progress to 30 minutes of continuous aerobic without signs/symptoms of physical distress     Intensity   THRR 40-80% of Max Heartrate 65-129   Ratings of Perceived Exertion 11-13   Perceived Dyspnea 0-4     Progression   Progression Continue to progress workloads to maintain intensity without signs/symptoms of physical distress.     Resistance Training   Training Prescription Yes   Weight 4lbs   Reps 10-15      Perform Capillary Blood Glucose checks as needed.  Exercise Prescription Changes:   Exercise Comments:   Exercise Goals and Review:      Exercise Goals    Row Name 05/11/17 1429             Exercise Goals   Increase Physical Activity Yes       Intervention Provide advice, education, support and counseling about physical activity/exercise needs.;Develop an individualized exercise prescription for aerobic and resistive training based on initial evaluation findings, risk stratification, comorbidities and participant's personal goals.       Expected Outcomes Achievement of  increased cardiorespiratory fitness and enhanced flexibility, muscular endurance and strength shown through measurements of functional capacity and personal statement of participant.       Increase Strength and Stamina Yes  get back in shape. long term: be  able to go fishing on [employer], education, support and counseling about physical activity/exercise needs.;Develop an individualized exercise prescription for aerobic and resistive training based on initial evaluation findings, risk stratification, comorbidities and participant's personal goals.       Expected Outcomes Achievement of increased cardiorespiratory fitness and enhanced flexibility, muscular endurance and strength shown through measurements of functional capacity and personal statement of participant.       Able to understand and use rate of perceived exertion (RPE) scale Yes       Intervention Provide education and explanation on how to use RPE scale       Expected Outcomes Short Term: Able to use RPE daily in rehab to express subjective intensity level;Long Term:  Able to use RPE to guide intensity level when exercising independently       Knowledge and understanding of Target Heart Rate Range (THRR) Yes       Intervention Provide education and explanation of THRR including how the numbers were predicted and where they are located for reference       Expected Outcomes Short Term: Able to state/look up THRR;Long Term: Able to use THRR to govern intensity when exercising independently;Short Term: Able to use daily as guideline for intensity in rehab       Able to check pulse independently Yes       Intervention Provide education and demonstration on how to check pulse in carotid and radial arteries.;Review the importance of being able to check your own pulse for safety during independent exercise       Expected Outcomes Short Term: Able to explain why pulse checking is important during independent  exercise;Long Term: Able to check pulse independently and accurately       Understanding of Exercise Prescription Yes       Intervention Provide education, explanation, and written materials on patient's individual exercise prescription       Expected Outcomes Short Term: Able to explain program exercise prescription;Long Term: Able to explain home exercise prescription to exercise independently          Exercise Goals Re-Evaluation :    Discharge Exercise Prescription (Final Exercise Prescription Changes):   Nutrition:  Target Goals: Understanding of nutrition guidelines, daily intake of sodium 1500mg , cholesterol 200mg , calories 30% from fat and 7% or less from saturated fats, daily to have 5 or more servings of fruits and vegetables.  Biometrics:     Pre Biometrics - 05/11/17 1513      Pre Biometrics   Waist Circumference 43 inches   Hip Circumference 44 inches   Waist to Hip Ratio 0.98 %   Triceps Skinfold 12 mm   % Body Fat 28.7 %   Grip Strength 45 kg   Flexibility 0 in   Single Leg Stand 30 seconds       Nutrition Therapy Plan and Nutrition Goals:   Nutrition Discharge: Nutrition Scores:   Nutrition Goals Re-Evaluation:   Nutrition Goals Re-Evaluation:   Nutrition Goals Discharge (Final Nutrition Goals Re-Evaluation):   Psychosocial: Target Goals: Acknowledge presence or absence of significant depression and/or stress, maximize coping skills, provide positive support system. Participant is able to verbalize types and ability to use techniques and skills needed for reducing stress and depression.  Initial Review & Psychosocial Screening:     Initial Psych Review & Screening - 05/11/17 1636      Initial Review   Current issues with Current Stress Concerns;Current Anxiety/Panic  Source of Stress Concerns Chronic Illness  situational stress      Family Dynamics   Good Support System? Yes  friends, spouse      Barriers   Psychosocial barriers  to participate in program The patient should benefit from training in stress management and relaxation.     Screening Interventions   Interventions Encouraged to exercise;Provide feedback about the scores to participant;To provide support and resources with identified psychosocial needs      Quality of Life Scores:     Quality of Life - 05/11/17 1628      Quality of Life Scores   Health/Function Pre 16 %   Socioeconomic Pre 23.07 %   Psych/Spiritual Pre 9.43 %   Family Pre 10.5 %   GLOBAL Pre 15.44 %      PHQ-9: Recent Review Flowsheet Data    There is no flowsheet data to display.     Interpretation of Total Score  Total Score Depression Severity:  1-4 = Minimal depression, 5-9 = Mild depression, 10-14 = Moderate depression, 15-19 = Moderately severe depression, 20-27 = Severe depression   Psychosocial Evaluation and Intervention:   Psychosocial Re-Evaluation:   Psychosocial Discharge (Final Psychosocial Re-Evaluation):   Vocational Rehabilitation: Provide vocational rehab assistance to qualifying candidates.   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 Participant will state understanding/return demonstration of topics presented.  Learning Barriers/Preferences:     Learning Barriers/Preferences - 05/11/17 1427      Learning Barriers/Preferences   Learning Barriers Sight   Learning Preferences Verbal Instruction;Skilled Demonstration;Pictoral;Video      Education Topics: Count Your Pulse:  -Group instruction provided by verbal instruction, demonstration, patient participation and written materials to  support subject.  Instructors address importance of being able to find your pulse and how to count your pulse when at home without a heart monitor.  Patients get hands on experience counting their pulse with staff help and individually.   Heart Attack, Angina, and Risk Factor Modification:  -Group instruction provided by verbal instruction, video, and written materials to support subject.  Instructors address signs and symptoms of angina and heart attacks.    Also discuss risk factors for heart disease and how to make changes to improve heart health risk factors.   Functional Fitness:  -Group instruction provided by verbal instruction, demonstration, patient participation, and written materials to support subject.  Instructors address safety measures for doing things around the house.  Discuss how to get up and down off the floor, how to pick things up properly, how to safely get out of a chair without assistance, and balance training.   Meditation and Mindfulness:  -Group instruction provided by verbal instruction, patient participation, and written materials to support subject.  Instructor addresses importance of mindfulness and meditation practice to help reduce stress and improve awareness.  Instructor also leads participants through a meditation exercise.    Stretching for Flexibility and Mobility:  -Group instruction provided by verbal instruction, patient participation, and written materials to support subject.  Instructors lead participants through series of stretches that are designed to increase flexibility thus improving mobility.  These stretches are additional exercise for major muscle  groups that are typically performed during regular warm up and cool down.   Hands Only CPR:  -Group verbal, video, and participation provides a basic overview of AHA guidelines for community CPR. Role-play of emergencies allow participants the opportunity to practice calling for help and chest  compression technique with discussion of AED use.   Hypertension: -Group verbal and written instruction that provides a basic overview of hypertension including the most recent diagnostic guidelines, risk factor reduction with self-care instructions and medication management.    Nutrition I class: Heart Healthy Eating:  -Group instruction provided by PowerPoint slides, verbal discussion, and written materials to support subject matter. The instructor gives an explanation and review of the Therapeutic Lifestyle Changes diet recommendations, which includes a discussion on lipid goals, dietary fat, sodium, fiber, plant stanol/sterol esters, sugar, and the components of a well-balanced, healthy diet.   Nutrition II class: Lifestyle Skills:  -Group instruction provided by PowerPoint slides, verbal discussion, and written materials to support subject matter. The instructor gives an explanation and review of label reading, grocery shopping for heart health, heart healthy recipe modifications, and ways to make healthier choices when eating out.   Diabetes Question & Answer:  -Group instruction provided by PowerPoint slides, verbal discussion, and written materials to support subject matter. The instructor gives an explanation and review of diabetes co-morbidities, pre- and post-prandial blood glucose goals, pre-exercise blood glucose goals, signs, symptoms, and treatment of hypoglycemia and hyperglycemia, and foot care basics.   Diabetes Blitz:  -Group instruction provided by PowerPoint slides, verbal discussion, and written materials to support subject matter. The instructor gives an explanation and review of the physiology behind type 1 and type 2 diabetes, diabetes medications and rational behind using different medications, pre- and post-prandial blood glucose recommendations and Hemoglobin A1c goals, diabetes diet, and exercise including blood glucose guidelines for exercising safely.    Portion  Distortion:  -Group instruction provided by PowerPoint slides, verbal discussion, written materials, and food models to support subject matter. The instructor gives an explanation of serving size versus portion size, changes in portions sizes over the last 20 years, and what consists of a serving from each food group.   Stress Management:  -Group instruction provided by verbal instruction, video, and written materials to support subject matter.  Instructors review role of stress in heart disease and how to cope with stress positively.     Exercising on Your Own:  -Group instruction provided by verbal instruction, power point, and written materials to support subject.  Instructors discuss benefits of exercise, components of exercise, frequency and intensity of exercise, and end points for exercise.  Also discuss use of nitroglycerin and activating EMS.  Review options of places to exercise outside of rehab.  Review guidelines for sex with heart disease.   Cardiac Drugs I:  -Group instruction provided by verbal instruction and written materials to support subject.  Instructor reviews cardiac drug classes: antiplatelets, anticoagulants, beta blockers, and statins.  Instructor discusses reasons, side effects, and lifestyle considerations for each drug class.   Cardiac Drugs II:  -Group instruction provided by verbal instruction and written materials to support subject.  Instructor reviews cardiac drug classes: angiotensin converting enzyme inhibitors (ACE-I), angiotensin II receptor blockers (ARBs), nitrates, and calcium channel blockers.  Instructor discusses reasons, side effects, and lifestyle considerations for each drug class.   Anatomy and Physiology of the Circulatory System:  Group verbal and written instruction and models provide basic cardiac anatomy and physiology, with the coronary electrical and arterial  systems. Review of: AMI, Angina, Valve disease, Heart Failure, Peripheral Artery  Disease, Cardiac Arrhythmia, Pacemakers, and the ICD.   Other Education:  -Group or individual verbal, written, or video instructions that support the educational goals of the cardiac rehab program.   Knowledge Questionnaire Score:     Knowledge Questionnaire Score - 05/11/17 1629      Knowledge Questionnaire Score   Pre Score 22/24      Core Components/Risk Factors/Patient Goals at Admission:     Personal Goals and Risk Factors at Admission - 05/11/17 1434      Core Components/Risk Factors/Patient Goals on Admission    Weight Management Yes;Weight Maintenance;Weight Loss   Intervention Weight Management: Develop a combined nutrition and exercise program designed to reach desired caloric intake, while maintaining appropriate intake of nutrient and fiber, sodium and fats, and appropriate energy expenditure required for the weight goal.;Weight Management: Provide education and appropriate resources to help participant work on and attain dietary goals.;Weight Management/Obesity: Establish reasonable short term and long term weight goals.   Admit Weight 215 lb 2.7 oz (97.6 kg)   Goal Weight: Short Term 200 lb (90.7 kg)   Goal Weight: Long Term 175 lb (79.4 kg)   Expected Outcomes Short Term: Continue to assess and modify interventions until short term weight is achieved;Weight Maintenance: Understanding of the daily nutrition guidelines, which includes 25-35% calories from fat, 7% or less cal from saturated fats, less than 200mg  cholesterol, less than 1.5gm of sodium, & 5 or more servings of fruits and vegetables daily;Weight Loss: Understanding of general recommendations for a balanced deficit meal plan, which promotes 1-2 lb weight loss per week and includes a negative energy balance of [phone] kcal/d;Understanding recommendations for meals to include 15-35% energy as protein, 25-35% energy from fat, 35-60% energy from carbohydrates, less than 200mg  of dietary cholesterol, 20-35 gm of  total fiber daily;Understanding of distribution of calorie intake throughout the day with the consumption of 4-5 meals/snacks   Stress Yes   Intervention Offer individual and/or small group education and counseling on adjustment to heart disease, stress management and health-related lifestyle change. Teach and support self-help strategies.;Refer participants experiencing significant psychosocial distress to appropriate mental health specialists for further evaluation and treatment. When possible, include family members and significant others in education/counseling sessions.   Expected Outcomes Short Term: Participant demonstrates changes in health-related behavior, relaxation and other stress management skills, ability to obtain effective social support, and compliance with psychotropic medications if prescribed.;Long Term: Emotional wellbeing is indicated by absence of clinically significant psychosocial distress or social isolation.      Core Components/Risk Factors/Patient Goals Review:    Core Components/Risk Factors/Patient Goals at Discharge (Final Review):    ITP Comments:     ITP Comments    Row Name 05/11/17 1400           ITP Comments Dr. Fransico Him, Medical Director           Comments: Patient attended orientation from 1334 to 1539  to review rules and guidelines for program. Completed 6 minute walk test, Intitial ITP, and exercise prescription.  VSS. Telemetry-sinus rhythm, frequent PVC.  Asymptomatic.

## 2017-05-12 NOTE — Progress Notes (Signed)
William Mason 56 y.o. male DOB: 07-31-1960 MRN: [id number]      Nutrition Note  1. Stented coronary artery    Past Medical History:  Diagnosis Date  . Arthritis    "knees, shoulders, back" (04/27/2017)  . Chronic back pain    "5 ruptured discs in the middle; 1 ruptured disc in the lower" (04/27/2017)  . Depression   . Diverticulitis   . Exertional heat stroke ~ 01/2017  . Family history of adverse reaction to anesthesia    "mom had emergency appy at age 80; she had severe short term memory loss; never recovered"  . GERD (gastroesophageal reflux disease)   . History of hiatal hernia   . Hyperlipidemia   . OSA on CPAP    Meds reviewed.   HT: Ht Readings from Last 1 Encounters:  05/11/17 5\' 10"  (1.778 m)    WT: Wt Readings from Last 3 Encounters:  05/11/17 215 lb 2.7 oz (97.6 kg)  04/28/17 209 lb 7 oz (95 kg)  03/30/17 224 lb (101.6 kg)     BMI 30.9   Current tobacco use? No  Labs:  Lipid Panel  No results found for: CHOL, TRIG, HDL, CHOLHDL, VLDL, LDLCALC, LDLDIRECT  No results found for: HGBA1C CBG (last 3)  No results for input(s): GLUCAP in the last 72 hours.  Nutrition Note Spoke with pt. Pt is obese. Per discussion, pt reports he gained 50 lb from inactivity due to injury (bilater shoulder injuries, which "hurt when I walked on the TM.") Pt states he has lost 10 lb and wants to lose more wt. Wt loss tips reviewed.   Nutrition plan and goals reviewed with pt. Pt is following Step 2 of the Therapeutic Lifestyle Changes diet. Pt expressed understanding of the information reviewed. Pt aware of nutrition education classes offered and plans on attending nutrition classes.  Nutrition Diagnosis ? Food-and nutrition-related knowledge deficit related to lack of exposure to information as related to diagnosis of: ? CVD ? Obesity related to excessive energy intake as evidenced by a BMI of 30.9  Nutrition Intervention ? Pt's individual nutrition plan and goals reviewed with  pt.  Nutrition Goal(s):  ? Pt to identify food quantities necessary to achieve weight loss of 6-24 lb (2.7-10.9 kg) at graduation from cardiac rehab. Long-term wt loss goal of 50 lb desired.   Plan:  Pt to attend nutrition classes ? Nutrition I ? Nutrition II ? Portion Distortion  Will provide client-centered nutrition education as part of interdisciplinary care.   Monitor and evaluate progress toward nutrition goal with team.  Derek Mound, M.Ed, RD, LDN, CDE 05/12/2017 8:11 AM

## 2017-05-19 ENCOUNTER — Encounter (HOSPITAL_COMMUNITY)
Admission: RE | Admit: 2017-05-19 | Discharge: 2017-05-19 | Disposition: A | Discharge status: Home / Self Care | Payer: BLUE CROSS/BLUE SHIELD | Source: Ambulatory Visit | Attending: Cardiology | Admitting: Cardiology

## 2017-05-19 ENCOUNTER — Ambulatory Visit (INDEPENDENT_AMBULATORY_CARE_PROVIDER_SITE_OTHER): Payer: BLUE CROSS/BLUE SHIELD | Admitting: Neurology

## 2017-05-19 DIAGNOSIS — E663 Overweight: Secondary | ICD-10-CM

## 2017-05-19 DIAGNOSIS — G4733 Obstructive sleep apnea (adult) (pediatric): Secondary | ICD-10-CM | POA: Diagnosis not present

## 2017-05-19 DIAGNOSIS — Z955 Presence of coronary angioplasty implant and graft: Secondary | ICD-10-CM

## 2017-05-19 DIAGNOSIS — Z9989 Dependence on other enabling machines and devices: Principal | ICD-10-CM

## 2017-05-19 DIAGNOSIS — Z48812 Encounter for surgical aftercare following surgery on the circulatory system: Secondary | ICD-10-CM | POA: Diagnosis not present

## 2017-05-19 NOTE — Progress Notes (Signed)
Daily Session Note  Patient Details  Name: William Mason MRN: [id number] Date of Birth: Sep 06, 1960 Referring Provider:     [place] from 05/11/2017 in [place]  Referring Provider  Vernell Leep MD      Encounter Date: 05/19/2017  Check In:     Session Check In - 05/19/17 1042      Check-In   Location [facility]   Staff Present Maurice Small, RN, BSN;Amber Fair, MS, ACSM RCEP, Exercise Physiologist;Olinty Celesta Aver, MS, ACSM CEP, Exercise Physiologist;Ludia Gartland Venetia Maxon, RN, BSN   Supervising physician immediately available to respond to emergencies [facility] immediately available   Physician(s) Dr. Broadus John   Medication changes reported     No   Fall or balance concerns reported    No   Tobacco Cessation No Change   Warm-up and Cool-down Performed as group-led instruction   Resistance Training Performed No   VAD Patient? No     Pain Assessment   Currently in Pain? No/denies      Capillary Blood Glucose: No results found for this or any previous visit (from the past 24 hour(s)).    History  Smoking Status  . Never Smoker  Smokeless Tobacco  . Former Systems developer  . Types: Chew  . Quit date: 1980    Goals Met:  Exercise tolerated well  Goals Unmet:  Not Applicable  Comments: Pt started cardiac rehab today.  Pt tolerated light exercise without difficulty. VSS, telemetry-Sinus Rhythm with an occasional PVC, asymptomatic.  Medication list reconciled. Pt denies barriers to medicaiton compliance.  PSYCHOSOCIAL ASSESSMENT:  PHQ-1. Pt exhibits positive coping skills, hopeful outlook with supportive family. No psychosocial needs identified at this time, no psychosocial interventions necessary.  [name] has a history of depression and is taking an antidepressant  Pt enjoys playing basketball and fishing.   Pt oriented to exercise equipment and routine.    Understanding verbalized. Barnet Pall,  RN,BSN 05/19/2017 11:16 AM   Dr. Fransico Him is Medical Director for Cardiac Rehab at [facility].

## 2017-05-21 ENCOUNTER — Encounter (HOSPITAL_COMMUNITY)
Admission: RE | Admit: 2017-05-21 | Discharge: 2017-05-21 | Disposition: A | Discharge status: Home / Self Care | Payer: BLUE CROSS/BLUE SHIELD | Source: Ambulatory Visit | Attending: Cardiology | Admitting: Cardiology

## 2017-05-21 DIAGNOSIS — Z48812 Encounter for surgical aftercare following surgery on the circulatory system: Secondary | ICD-10-CM | POA: Insufficient documentation

## 2017-05-21 DIAGNOSIS — Z955 Presence of coronary angioplasty implant and graft: Secondary | ICD-10-CM | POA: Insufficient documentation

## 2017-05-21 NOTE — Addendum Note (Signed)
Addended by: Larey Seat on: 05/21/2017 01:22 PM   Modules accepted: Orders

## 2017-05-21 NOTE — Procedures (Signed)
[facility] [address] [place], [place] [address] NAME:  William Mason                                                           DOB: 1960/10/05 MEDICAL RECORD NUMBER [id number]                                        DOS: 05/19/2017 REFERRING PHYSICIAN: Jani Gravel, M.D.  STUDY PERFORMED: Home Sleep Study HISTORY:   56 year old male patient of Dr. Julianne Rice, who tested positive for OSA 2011.  He was placed on CPAP at that time. Pt has not been followed up with since.  Pt is in need of new machine and equipment. BMI 31.2. Epworth Sleepiness Score endorsed at 15/24 points, FSS 20 points,         STUDY RESULTS: Total Recording Time:  8 hours, 37 minutes Total Apnea/Hypopnea Index (AHI):   13.2 /hour and RDI was 15.6 /hr. Average Oxygen Saturation SpO2 at 91%; Lowest Oxygen Saturation 80 %  Total Time in Oxygen Saturation Below 89%: 66 minutes= 13 % Average Heart Rate: 71 bpm (60 - 113 bpm)  IMPRESSION: Mild and mainly obstructive sleep apnea with prolonged desaturation time- this constellation warrants CPAP therapy.  RECOMMENDATION: Auto-titrating CPAP with a window of 5-12 cm water pressure, with mask of choice, heated humidity. I certify that I have reviewed the raw data recording prior to the issuance of this report in accordance with the standards of the American Academy of Sleep Medicine (AASM). Larey Seat, M.D.     05-21-2017   Medical Director of [facility] and [place] and [place] Accredited Member of the AASM

## 2017-05-21 NOTE — Progress Notes (Signed)
Cardiac Individual Treatment Plan  Patient Details  Name: William Mason MRN: [id number] Date of Birth: [birth year] Referring Provider:     [place] from 58/23/2018 in [place]  Referring Provider  Vernell Leep MD      Initial Encounter Date:    CARDIAC REHAB PHASE II ORIENTATION from 58/23/2018 in [place]  Date  05/11/17  Referring Provider  Vernell Leep MD      Visit Diagnosis: Stented coronary artery  Patient's Home Medications on Admission:  Current Outpatient Prescriptions:  .  aspirin 81 MG chewable tablet, Chew 1 tablet (81 mg total) by mouth daily., Disp: 90 tablet, Rfl: 6 .  cetirizine (ZYRTEC) 10 MG tablet, Take 10 mg by mouth daily. , Disp: , Rfl:  .  clopidogrel (PLAVIX) 75 MG tablet, Take 1 tablet (75 mg total) by mouth daily., Disp: 90 tablet, Rfl: 3 .  diazepam (VALIUM) 5 MG tablet, Take 5 mg by mouth 2 (two) times daily as needed for anxiety., Disp: , Rfl:  .  DULoxetine (CYMBALTA) 30 MG capsule, Take 30 mg by mouth 2 (two) times daily. , Disp: , Rfl:  .  ezetimibe (ZETIA) 10 MG tablet, Take 10 mg by mouth daily., Disp: , Rfl:  .  fluticasone (FLONASE) 50 MCG/ACT nasal spray, Place 1 spray into both nostrils at bedtime., Disp: , Rfl:  .  metaxalone (SKELAXIN) 800 MG tablet, Take 800 mg by mouth daily as needed for muscle spasms. , Disp: , Rfl:  .  metoprolol succinate (TOPROL-XL) 25 MG 24 hr tablet, Take 37.5 mg by mouth daily. , Disp: , Rfl:  .  oxyCODONE-acetaminophen (PERCOCET/ROXICET) 5-325 MG tablet, Take 1 tablet by mouth every 6 (six) hours as needed for severe pain., Disp: , Rfl:  .  pantoprazole (PROTONIX) 40 MG tablet, Take 1 tablet (40 mg total) by mouth daily., Disp: 30 tablet, Rfl: 6 .  Polyethyl Glycol-Propyl Glycol (SYSTANE ULTRA) 0.4-0.3 % SOLN, Place 1-2 drops into both eyes daily as needed (eye irritation)., Disp: , Rfl:  .  rosuvastatin  (CRESTOR) 20 MG tablet, Take 1 tablet (20 mg total) by mouth at bedtime., Disp: 30 tablet, Rfl: 11 .  traMADol (ULTRAM) 50 MG tablet, Take 50 mg by mouth every 6 (six) hours as needed for moderate pain., Disp: , Rfl:   Past Medical History: Past Medical History:  Diagnosis Date  . Arthritis    "knees, shoulders, back" (04/27/2017)  . Chronic back pain    "5 ruptured discs in the middle; 1 ruptured disc in the lower" (04/27/2017)  . Depression   . Diverticulitis   . Exertional heat stroke ~ 01/2017  . Family history of adverse reaction to anesthesia    "mom had emergency appy at age 54; she had severe short term memory loss; never recovered"  . GERD (gastroesophageal reflux disease)   . History of hiatal hernia   . Hyperlipidemia   . OSA on CPAP     Tobacco Use: History  Smoking Status  . Never Smoker  Smokeless Tobacco  . Former Systems developer  . Types: Chew  . Quit date: 56    Labs: Recent Review Flowsheet Data    There is no flowsheet data to display.      Capillary Blood Glucose: No results found for: GLUCAP   Exercise Target Goals:    Exercise Program Goal: Individual exercise prescription set with THRR, safety & activity barriers. Participant demonstrates ability to understand  and report RPE using BORG scale, to self-measure pulse accurately, and to acknowledge the importance of the exercise prescription.  Exercise Prescription Goal: Starting with aerobic activity 30 plus minutes a day, 3 days per week for initial exercise prescription. Provide home exercise prescription and guidelines that participant acknowledges understanding prior to discharge.  Activity Barriers & Risk Stratification:     Activity Barriers & Cardiac Risk Stratification - 05/11/17 1515      Activity Barriers & Cardiac Risk Stratification   Cardiac Risk Stratification Moderate      6 Minute Walk:     6 Minute Walk    Row Name 05/11/17 1510         6 Minute Walk   Phase Initial      Distance 1858 feet     Walk Time 6 minutes     # of Rest Breaks 0     MPH 4.43     METS 3.4     RPE 12     Symptoms No     Resting HR 90 bpm     Resting BP 118/62     Resting Oxygen Saturation  96 %     Exercise Oxygen Saturation  during 6 min walk 95 %     Max Ex. HR 115 bpm     Max Ex. BP 134/80     2 Minute Post BP 106/62        Oxygen Initial Assessment:   Oxygen Re-Evaluation:   Oxygen Discharge (Final Oxygen Re-Evaluation):   Initial Exercise Prescription:     Initial Exercise Prescription - 05/11/17 1500      Date of Initial Exercise RX and Referring Provider   Date 05/11/17   Referring Provider Vernell Leep MD     Treadmill   MPH 3   Grade 1   Minutes 10   METs 3.71     Bike   Level 1   Minutes 10   METs 2.99     NuStep   Level 3   SPM 80   Minutes 10   METs 3     Prescription Details   Frequency (times per week) 3   Duration Progress to 30 minutes of continuous aerobic without signs/symptoms of physical distress     Intensity   THRR 40-80% of Max Heartrate 65-129   Ratings of Perceived Exertion 11-13   Perceived Dyspnea 0-4     Progression   Progression Continue to progress workloads to maintain intensity without signs/symptoms of physical distress.     Resistance Training   Training Prescription Yes   Weight 4lbs   Reps 10-15      Perform Capillary Blood Glucose checks as needed.  Exercise Prescription Changes:      Exercise Prescription Changes    Row Name 05/19/17 1100             Response to Exercise   Blood Pressure (Admit) 112/60       Blood Pressure (Exercise) 142/70       Blood Pressure (Exit) 102/82       Heart Rate (Admit) 80 bpm       Heart Rate (Exercise) 118 bpm       Heart Rate (Exit) 81 bpm       Rating of Perceived Exertion (Exercise) 11       Symptoms none       Duration Continue with 30 min of aerobic exercise without signs/symptoms of physical distress.  Intensity THRR unchanged          Progression   Progression Continue to progress workloads to maintain intensity without signs/symptoms of physical distress.       Average METs 2.9         Resistance Training   Training Prescription Yes       Weight 4lbs       Reps 10-15       Time 10 Minutes         Interval Training   Interval Training No         Treadmill   MPH 3       Grade 1       Minutes 10       METs 3.71         Bike   Level 1       Minutes 10       METs 2.93         NuStep   Level 3       SPM 80       Minutes 10       METs 2.1          Exercise Comments:      Exercise Comments    Row Name 05/19/17 1122           Exercise Comments Off to a good start with exercise.          Exercise Goals and Review:      Exercise Goals    Row Name 05/11/17 1429             Exercise Goals   Increase Physical Activity Yes       Intervention Provide advice, education, support and counseling about physical activity/exercise needs.;Develop an individualized exercise prescription for aerobic and resistive training based on initial evaluation findings, risk stratification, comorbidities and participant's personal goals.       Expected Outcomes Achievement of increased cardiorespiratory fitness and enhanced flexibility, muscular endurance and strength shown through measurements of functional capacity and personal statement of participant.       Increase Strength and Stamina Yes  get back in shape. long term: be able to go fishing on the [employer] advice, education, support and counseling about physical activity/exercise needs.;Develop an individualized exercise prescription for aerobic and resistive training based on initial evaluation findings, risk stratification, comorbidities and participant's personal goals.       Expected Outcomes Achievement of increased cardiorespiratory fitness and enhanced flexibility, muscular endurance and strength shown through measurements of  functional capacity and personal statement of participant.       Able to understand and use rate of perceived exertion (RPE) scale Yes       Intervention Provide education and explanation on how to use RPE scale       Expected Outcomes Short Term: Able to use RPE daily in rehab to express subjective intensity level;Long Term:  Able to use RPE to guide intensity level when exercising independently       Knowledge and understanding of Target Heart Rate Range (THRR) Yes       Intervention Provide education and explanation of THRR including how the numbers were predicted and where they are located for reference       Expected Outcomes Short Term: Able to state/look up THRR;Long Term: Able to use THRR to govern intensity when exercising independently;Short Term: Able to use daily as guideline for intensity in rehab  Able to check pulse independently Yes       Intervention Provide education and demonstration on how to check pulse in carotid and radial arteries.;Review the importance of being able to check your own pulse for safety during independent exercise       Expected Outcomes Short Term: Able to explain why pulse checking is important during independent exercise;Long Term: Able to check pulse independently and accurately       Understanding of Exercise Prescription Yes       Intervention Provide education, explanation, and written materials on patient's individual exercise prescription       Expected Outcomes Short Term: Able to explain program exercise prescription;Long Term: Able to explain home exercise prescription to exercise independently          Exercise Goals Re-Evaluation :     Exercise Goals Re-Evaluation    Row Name 05/19/17 1121             Exercise Goal Re-Evaluation   Exercise Goals Review Able to understand and use rate of perceived exertion (RPE) scale       Comments Patient tolerated low-moderate intensity exercise without c/o.       Expected Outcomes Increase  workloads as tolerated.           Discharge Exercise Prescription (Final Exercise Prescription Changes):     Exercise Prescription Changes - 05/19/17 1100      Response to Exercise   Blood Pressure (Admit) 112/60   Blood Pressure (Exercise) 142/70   Blood Pressure (Exit) 102/82   Heart Rate (Admit) 80 bpm   Heart Rate (Exercise) 118 bpm   Heart Rate (Exit) 81 bpm   Rating of Perceived Exertion (Exercise) 11   Symptoms none   Duration Continue with 30 min of aerobic exercise without signs/symptoms of physical distress.   Intensity THRR unchanged     Progression   Progression Continue to progress workloads to maintain intensity without signs/symptoms of physical distress.   Average METs 2.9     Resistance Training   Training Prescription Yes   Weight 4lbs   Reps 10-15   Time 10 Minutes     Interval Training   Interval Training No     Treadmill   MPH 3   Grade 1   Minutes 10   METs 3.71     Bike   Level 1   Minutes 10   METs 2.93     NuStep   Level 3   SPM 80   Minutes 10   METs 2.1      Nutrition:  Target Goals: Understanding of nutrition guidelines, daily intake of sodium 1500mg , cholesterol 200mg , calories 30% from fat and 7% or less from saturated fats, daily to have 5 or more servings of fruits and vegetables.  Biometrics:     Pre Biometrics - 05/11/17 1513      Pre Biometrics   Waist Circumference 43 inches   Hip Circumference 44 inches   Waist to Hip Ratio 0.98 %   Triceps Skinfold 12 mm   % Body Fat 28.7 %   Grip Strength 45 kg   Flexibility 0 in   Single Leg Stand 30 seconds       Nutrition Therapy Plan and Nutrition Goals:     Nutrition Therapy & Goals - 05/12/17 0837      Nutrition Therapy   Diet Therapeutic Lifestyle Changes     Personal Nutrition Goals   Nutrition Goal Wt loss of 1-2 lb/week to  a wt loss goal of 6-24 lb at graduation from [place]. Long-term wt loss goal of 50 lb desired.      Intervention Plan    Intervention Prescribe, educate and counsel regarding individualized specific dietary modifications aiming towards targeted core components such as weight, hypertension, lipid management, diabetes, heart failure and other comorbidities.   Expected Outcomes Short Term Goal: Understand basic principles of dietary content, such as calories, fat, sodium, cholesterol and nutrients.;Long Term Goal: Adherence to prescribed nutrition plan.      Nutrition Discharge: Nutrition Scores:     Nutrition Assessments - 05/12/17 0838      MEDFICTS Scores   Pre Score 27      Nutrition Goals Re-Evaluation:   Nutrition Goals Re-Evaluation:   Nutrition Goals Discharge (Final Nutrition Goals Re-Evaluation):   Psychosocial: Target Goals: Acknowledge presence or absence of significant depression and/or stress, maximize coping skills, provide positive support system. Participant is able to verbalize types and ability to use techniques and skills needed for reducing stress and depression.  Initial Review & Psychosocial Screening:     Initial Psych Review & Screening - 05/11/17 1636      Initial Review   Current issues with Current Stress Concerns;Current Anxiety/Panic   Source of Stress Concerns Chronic Illness  situational stress      Family Dynamics   Good Support System? Yes  friends, spouse      Barriers   Psychosocial barriers to participate in program The patient should benefit from training in stress management and relaxation.     Screening Interventions   Interventions Encouraged to exercise;Provide feedback about the scores to participant;To provide support and resources with identified psychosocial needs      Quality of Life Scores:     Quality of Life - 05/11/17 1628      Quality of Life Scores   Health/Function Pre 16 %   Socioeconomic Pre 23.07 %   Psych/Spiritual Pre 9.43 %   Family Pre 10.5 %   GLOBAL Pre 15.44 %      PHQ-9: Recent Review Flowsheet Data     Depression screen [facility] 2/9 05/20/2017   Decreased Interest 0   Down, Depressed, Hopeless 1    PHQ - 2 Score 1     Interpretation of Total Score  Total Score Depression Severity:  1-4 = Minimal depression, 5-9 = Mild depression, 10-14 = Moderate depression, 15-19 = Moderately severe depression, 20-27 = Severe depression   Psychosocial Evaluation and Intervention:   Psychosocial Re-Evaluation:   Psychosocial Discharge (Final Psychosocial Re-Evaluation):   Vocational Rehabilitation: Provide vocational rehab assistance to qualifying candidates.   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 Participant will state understanding/return demonstration of topics presented.  Learning Barriers/Preferences:     Learning Barriers/Preferences - 05/11/17 1427      Learning Barriers/Preferences   Learning Barriers Sight   Learning Preferences Verbal Instruction;Skilled Demonstration;Pictoral;Video      Education Topics: Count Your Pulse:  -Group instruction provided by verbal instruction, demonstration, patient participation and written materials to support subject.  Instructors address importance of being able to find your pulse and how to count your pulse when at  home without a heart monitor.  Patients get hands on experience counting their pulse with staff help and individually.   CARDIAC REHAB PHASE II EXERCISE from 05/21/2017 in [place]  Date  05/21/17  Instruction Review Code  2- meets goals/outcomes      Heart Attack, Angina, and Risk Factor Modification:  -Group instruction provided by verbal  instruction, video, and written materials to support subject.  Instructors address signs and symptoms of angina and heart attacks.    Also discuss risk factors for heart disease and how to make changes to improve heart health risk factors.   CARDIAC REHAB PHASE II EXERCISE from 05/21/2017 in [place]  Date  05/19/17  Instruction Review Code  2- meets goals/outcomes      Functional Fitness:  -Group instruction provided by verbal instruction, demonstration, patient participation, and written materials to support subject.  Instructors address safety measures for doing things around the house.  Discuss how to get up and down off the floor, how to pick things up properly, how to safely get out of a chair without assistance, and balance training.   Meditation and Mindfulness:  -Group instruction provided by verbal instruction, patient participation, and written materials to support subject.  Instructor addresses importance of mindfulness and meditation practice to help reduce stress and improve awareness.  Instructor also leads participants through a meditation exercise.    Stretching for Flexibility and Mobility:  -Group instruction provided by verbal instruction, patient participation, and written materials to support subject.  Instructors lead participants through series of stretches that are designed to increase flexibility thus improving mobility.  These stretches are additional exercise for major muscle groups that are typically performed during regular warm up and cool down.   Hands Only CPR:  -Group verbal, video, and participation provides a basic overview of AHA guidelines for community CPR. Role-play of emergencies allow participants the opportunity to practice calling for help and chest compression technique with discussion of AED use.   Hypertension: -Group verbal and written instruction that provides a basic overview of hypertension including the most  recent diagnostic guidelines, risk factor reduction with self-care instructions and medication management.    Nutrition I class: Heart Healthy Eating:  -Group instruction provided by PowerPoint slides, verbal discussion, and written materials to support subject matter. The instructor gives an explanation and review of the Therapeutic Lifestyle Changes diet recommendations, which includes a discussion on lipid goals, dietary fat, sodium, fiber, plant stanol/sterol esters, sugar, and the components of a well-balanced, healthy diet.   Nutrition II class: Lifestyle Skills:  -Group instruction provided by PowerPoint slides, verbal discussion, and written materials to support subject matter. The instructor gives an explanation and review of label reading, grocery shopping for heart health, heart healthy recipe modifications, and ways to make healthier choices when eating out.   Diabetes Question & Answer:  -Group instruction provided by PowerPoint slides, verbal discussion, and written materials to support subject matter. The instructor gives an explanation and review of diabetes co-morbidities, pre- and post-prandial blood glucose goals, pre-exercise blood glucose goals, signs, symptoms, and treatment of hypoglycemia and hyperglycemia, and foot care basics.   Diabetes Blitz:  -Group instruction provided by PowerPoint slides, verbal discussion, and written materials to support subject matter. The instructor gives an explanation and review of the physiology behind type 1 and type 2 diabetes, diabetes medications and rational behind using different medications, pre- and post-prandial blood glucose recommendations and Hemoglobin A1c goals, diabetes  diet, and exercise including blood glucose guidelines for exercising safely.    Portion Distortion:  -Group instruction provided by PowerPoint slides, verbal discussion, written materials, and food models to support subject matter. The instructor gives an  explanation of serving size versus portion size, changes in portions sizes over the last 20 years, and what consists of a serving from each food group.   Stress Management:  -Group instruction provided by verbal instruction, video, and written materials to support subject matter.  Instructors review role of stress in heart disease and how to cope with stress positively.     Exercising on Your Own:  -Group instruction provided by verbal instruction, power point, and written materials to support subject.  Instructors discuss benefits of exercise, components of exercise, frequency and intensity of exercise, and end points for exercise.  Also discuss use of nitroglycerin and activating EMS.  Review options of places to exercise outside of rehab.  Review guidelines for sex with heart disease.   Cardiac Drugs I:  -Group instruction provided by verbal instruction and written materials to support subject.  Instructor reviews cardiac drug classes: antiplatelets, anticoagulants, beta blockers, and statins.  Instructor discusses reasons, side effects, and lifestyle considerations for each drug class.   Cardiac Drugs II:  -Group instruction provided by verbal instruction and written materials to support subject.  Instructor reviews cardiac drug classes: angiotensin converting enzyme inhibitors (ACE-I), angiotensin II receptor blockers (ARBs), nitrates, and calcium channel blockers.  Instructor discusses reasons, side effects, and lifestyle considerations for each drug class.   Anatomy and Physiology of the Circulatory System:  Group verbal and written instruction and models provide basic cardiac anatomy and physiology, with the coronary electrical and arterial systems. Review of: AMI, Angina, Valve disease, Heart Failure, Peripheral Artery Disease, Cardiac Arrhythmia, Pacemakers, and the ICD.   Other Education:  -Group or individual verbal, written, or video instructions that support the educational  goals of the cardiac rehab program.   Knowledge Questionnaire Score:     Knowledge Questionnaire Score - 05/11/17 1629      Knowledge Questionnaire Score   Pre Score 22/24      Core Components/Risk Factors/Patient Goals at Admission:     Personal Goals and Risk Factors at Admission - 05/11/17 1434      Core Components/Risk Factors/Patient Goals on Admission    Weight Management Yes;Weight Maintenance;Weight Loss   Intervention Weight Management: Develop a combined nutrition and exercise program designed to reach desired caloric intake, while maintaining appropriate intake of nutrient and fiber, sodium and fats, and appropriate energy expenditure required for the weight goal.;Weight Management: Provide education and appropriate resources to help participant work on and attain dietary goals.;Weight Management/Obesity: Establish reasonable short term and long term weight goals.   Admit Weight 215 lb 2.7 oz (97.6 kg)   Goal Weight: Short Term 200 lb (90.7 kg)   Goal Weight: Long Term 175 lb (79.4 kg)   Expected Outcomes Short Term: Continue to assess and modify interventions until short term weight is achieved;Weight Maintenance: Understanding of the daily nutrition guidelines, which includes 25-35% calories from fat, 7% or less cal from saturated fats, less than 200mg  cholesterol, less than 1.5gm of sodium, & 5 or more servings of fruits and vegetables daily;Weight Loss: Understanding of general recommendations for a balanced deficit meal plan, which promotes 1-2 lb weight loss per week and includes a negative energy balance of [phone] kcal/d;Understanding recommendations for meals to include 15-35% energy as protein, 25-35% energy from fat, 35-60% energy from  carbohydrates, less than 200mg  of dietary cholesterol, 20-35 gm of total fiber daily;Understanding of distribution of calorie intake throughout the day with the consumption of 4-5 meals/snacks   Stress Yes   Intervention Offer  individual and/or small group education and counseling on adjustment to heart disease, stress management and health-related lifestyle change. Teach and support self-help strategies.;Refer participants experiencing significant psychosocial distress to appropriate mental health specialists for further evaluation and treatment. When possible, include family members and significant others in education/counseling sessions.   Expected Outcomes Short Term: Participant demonstrates changes in health-related behavior, relaxation and other stress management skills, ability to obtain effective social support, and compliance with psychotropic medications if prescribed.;Long Term: Emotional wellbeing is indicated by absence of clinically significant psychosocial distress or social isolation.      Core Components/Risk Factors/Patient Goals Review:    Core Components/Risk Factors/Patient Goals at Discharge (Final Review):    ITP Comments:     ITP Comments    Row Name 05/11/17 1400           ITP Comments Dr. Fransico Him, Medical Director           Comments: Chriss Czar is off to a good start to exercise after completing 3 sessions.Barnet Pall, RN,BSN 05/21/2017 4:53 PM

## 2017-05-24 ENCOUNTER — Telehealth: Payer: Self-pay | Admitting: Neurology

## 2017-05-24 ENCOUNTER — Encounter (HOSPITAL_COMMUNITY)
Admission: RE | Admit: 2017-05-24 | Discharge: 2017-05-24 | Disposition: A | Discharge status: Home / Self Care | Payer: BLUE CROSS/BLUE SHIELD | Source: Ambulatory Visit | Attending: Cardiology | Admitting: Cardiology

## 2017-05-24 DIAGNOSIS — Z955 Presence of coronary angioplasty implant and graft: Secondary | ICD-10-CM

## 2017-05-24 DIAGNOSIS — Z48812 Encounter for surgical aftercare following surgery on the circulatory system: Secondary | ICD-10-CM | POA: Diagnosis not present

## 2017-05-24 NOTE — Telephone Encounter (Signed)
Called to discuss sleep study result. No answer. LVM for pt to return call

## 2017-05-24 NOTE — Telephone Encounter (Signed)
I called pt. I advised pt that Dr. Brett Fairy reviewed their sleep study results and found that pt has sleep apnea. Dr. Brett Fairy recommends that pt continues using CPAP and starts a new CPAP auto capable. I reviewed PAP compliance expectations with the pt. Pt is agreeable to starting a CPAP. I advised pt that an order will be sent to a DME, [facility], and [facility] will call the pt within about one week after they file with the pt's insurance. [facility] will show the pt how to use the machine, fit for masks, and troubleshoot the CPAP if needed. A follow up appt was made for insurance purposes with Justin Mend on Aug 05, 2017 at 10:00 am. Pt verbalized understanding to arrive 15 minutes early and bring their CPAP. A letter with all of this information in it will be mailed to the pt as a reminder. I verified with the pt that the address we have on file is correct. Pt verbalized understanding of results. Pt had no questions at this time but was encouraged to call back if questions arise.

## 2017-05-24 NOTE — Telephone Encounter (Signed)
Pt has returned the call to [place] and would like a call back

## 2017-05-24 NOTE — Telephone Encounter (Signed)
-----   Message from Larey Seat, MD sent at 05/21/2017  1:22 PM EDT ----- This HST confirmed continued need for CPAP therapy- auto titration CPAP machine was ordered. 5-12 cm water pressure window, mask of choice , heated humidification.

## 2017-05-26 ENCOUNTER — Encounter (HOSPITAL_COMMUNITY)
Admission: RE | Admit: 2017-05-26 | Discharge: 2017-05-26 | Disposition: A | Discharge status: Home / Self Care | Payer: BLUE CROSS/BLUE SHIELD | Source: Ambulatory Visit | Attending: Cardiology | Admitting: Cardiology

## 2017-05-26 DIAGNOSIS — Z48812 Encounter for surgical aftercare following surgery on the circulatory system: Secondary | ICD-10-CM | POA: Diagnosis not present

## 2017-05-26 DIAGNOSIS — Z955 Presence of coronary angioplasty implant and graft: Secondary | ICD-10-CM

## 2017-05-28 ENCOUNTER — Encounter (HOSPITAL_COMMUNITY)
Admission: RE | Admit: 2017-05-28 | Discharge: 2017-05-28 | Disposition: A | Discharge status: Home / Self Care | Payer: BLUE CROSS/BLUE SHIELD | Source: Ambulatory Visit | Attending: Cardiology | Admitting: Cardiology

## 2017-05-28 DIAGNOSIS — Z48812 Encounter for surgical aftercare following surgery on the circulatory system: Secondary | ICD-10-CM | POA: Diagnosis not present

## 2017-05-28 DIAGNOSIS — Z955 Presence of coronary angioplasty implant and graft: Secondary | ICD-10-CM

## 2017-05-31 ENCOUNTER — Encounter (HOSPITAL_COMMUNITY)
Admission: RE | Admit: 2017-05-31 | Discharge: 2017-05-31 | Disposition: A | Discharge status: Home / Self Care | Payer: BLUE CROSS/BLUE SHIELD | Source: Ambulatory Visit | Attending: Cardiology | Admitting: Cardiology

## 2017-05-31 DIAGNOSIS — Z48812 Encounter for surgical aftercare following surgery on the circulatory system: Secondary | ICD-10-CM | POA: Diagnosis not present

## 2017-05-31 DIAGNOSIS — Z955 Presence of coronary angioplasty implant and graft: Secondary | ICD-10-CM

## 2017-05-31 NOTE — Progress Notes (Signed)
Reviewed home exercise guidelines with patient including endpoints, temperature precautions, target heart rate and rate of perceived exertion. Pt plans to exercise at local gym and walk 30-60 minutes 5-7 days/week as his mode of home exercise. Pt voices understanding of instructions given. Sol Passer, MS, ACSM CEP

## 2017-06-02 ENCOUNTER — Encounter (HOSPITAL_COMMUNITY): Payer: BLUE CROSS/BLUE SHIELD

## 2017-06-03 NOTE — Progress Notes (Signed)
Discharge Progress Report  Patient Details  Name: William Mason MRN: [id number] Date of Birth: Jul 15, 1961 Referring Provider:     [place] from 05/11/2017 in [place]  Referring Provider  Vernell Leep MD       Number of Visits: 8  Reason for Discharge:  Early Exit:  Back to work  Smoking History:  Social History   Tobacco Use  Smoking Status Never Smoker  Smokeless Tobacco Former Systems developer  . Types: Chew    Diagnosis:  No diagnosis found.  ADL UCSD:   Initial Exercise Prescription: Initial Exercise Prescription - 05/11/17 1500      Date of Initial Exercise RX and Referring Provider   Date  05/11/17    Referring Provider  Vernell Leep MD      Treadmill   MPH  3    Grade  1    Minutes  10    METs  3.71      Bike   Level  1    Minutes  10    METs  2.99      NuStep   Level  3    SPM  80    Minutes  10    METs  3      Prescription Details   Frequency (times per week)  3    Duration  Progress to 30 minutes of continuous aerobic without signs/symptoms of physical distress      Intensity   THRR 40-80% of Max Heartrate  65-129    Ratings of Perceived Exertion  11-13    Perceived Dyspnea  0-4      Progression   Progression  Continue to progress workloads to maintain intensity without signs/symptoms of physical distress.      Resistance Training   Training Prescription  Yes    Weight  4lbs    Reps  10-15       Discharge Exercise Prescription (Final Exercise Prescription Changes): Exercise Prescription Changes - 05/19/17 1100      Response to Exercise   Blood Pressure (Admit)  112/60    Blood Pressure (Exercise)  142/70    Blood Pressure (Exit)  102/82    Heart Rate (Admit)  80 bpm    Heart Rate (Exercise)  118 bpm    Heart Rate (Exit)  81 bpm    Rating of Perceived Exertion (Exercise)  11    Symptoms  none    Duration  Continue with 30 min of aerobic exercise without  signs/symptoms of physical distress.    Intensity  THRR unchanged      Progression   Progression  Continue to progress workloads to maintain intensity without signs/symptoms of physical distress.    Average METs  2.9      Resistance Training   Training Prescription  Yes    Weight  4lbs    Reps  10-15    Time  10 Minutes      Interval Training   Interval Training  No      Treadmill   MPH  3    Grade  1    Minutes  10    METs  3.71      Bike   Level  1    Minutes  10    METs  2.93      NuStep   Level  3    SPM  80    Minutes  10    METs  2.1       Functional Capacity: 6 Minute Walk    Row Name 05/11/17 1510         6 Minute Walk   Phase  Initial     Distance  1800 feet     Walk Time  6 minutes     # of Rest Breaks  0     MPH  4.43     METS  3.4     RPE  12     Symptoms  No     Resting HR  90 bpm     Resting BP  118/62     Resting Oxygen Saturation   96 %     Exercise Oxygen Saturation  during 6 min walk  95 %     Max Ex. HR  115 bpm     Max Ex. BP  134/80     2 Minute Post BP  106/62        Psychological, QOL, Others - Outcomes: PHQ 2/9: Depression screen [employer] 2/9 06/03/2017 05/20/2017  Decreased Interest 0 0  Down, Depressed, Hopeless 0 1  PHQ - 2 Score 0 1    Quality of Life: Quality of Life - 06/03/17 1314      Quality of Life Scores   GLOBAL Pre  -- Reviewed mr Blythes QOL with him. Patient has upcoming appontment with the hospital chaplain.       Personal Goals: Goals established at orientation with interventions provided to work toward goal. Personal Goals and Risk Factors at Admission - 05/11/17 1434      Core Components/Risk Factors/Patient Goals on Admission    Weight Management  Yes;Weight Maintenance;Weight Loss    Intervention  Weight Management: Develop a combined nutrition and exercise program designed to reach desired caloric intake, while maintaining appropriate intake of nutrient and fiber, sodium and fats, and appropriate  energy expenditure required for the weight goal.;Weight Management: Provide education and appropriate resources to help participant work on and attain dietary goals.;Weight Management/Obesity: Establish reasonable short term and long term weight goals.    Admit Weight  215 lb 2.7 oz (97.6 kg)    Goal Weight: Short Term  200 lb (90.7 kg)    Goal Weight: Long Term  175 lb (79.4 kg)    Expected Outcomes  Short Term: Continue to assess and modify interventions until short term weight is achieved;Weight Maintenance: Understanding of the daily nutrition guidelines, which includes 25-35% calories from fat, 7% or less cal from saturated fats, less than 258m cholesterol, less than 1.5gm of sodium, & 5 or more servings of fruits and vegetables daily;Weight Loss: Understanding of general recommendations for a balanced deficit meal plan, which promotes 1-2 lb weight loss per week and includes a negative energy balance of [phone] kcal/d;Understanding recommendations for meals to include 15-35% energy as protein, 25-35% energy from fat, 35-60% energy from carbohydrates, less than 206mof dietary cholesterol, 20-35 gm of total fiber daily;Understanding of distribution of calorie intake throughout the day with the consumption of 4-5 meals/snacks    Stress  Yes    Intervention  Offer individual and/or small group education and counseling on adjustment to heart disease, stress management and health-related lifestyle change. Teach and support self-help strategies.;Refer participants experiencing significant psychosocial distress to appropriate mental health specialists for further evaluation and treatment. When possible, include family members and significant others in education/counseling sessions.    Expected Outcomes  Short Term: Participant demonstrates changes in health-related behavior, relaxation and other  stress management skills, ability to obtain effective social support, and compliance with psychotropic  medications if prescribed.;Long Term: Emotional wellbeing is indicated by absence of clinically significant psychosocial distress or social isolation.        Personal Goals Discharge: Goals and Risk Factor Review    Row Name 06/03/17 1349             Core Components/Risk Factors/Patient Goals Review   Personal Goals Review  Weight Management/Obesity;Lipids;Stress;Hypertension       Review  [name] has done well with exericse at cardiac rehab and lost weight       Expected Outcomes  [name] will continue to take his medications as presribed for HTN and hperlipidemia, Continue to exercise on his own and follow a heart healthy diet          Exercise Goals and Review: Exercise Goals    Row Name 05/11/17 1429             Exercise Goals   Increase Physical Activity  Yes       Intervention  Provide advice, education, support and counseling about physical activity/exercise needs.;Develop an individualized exercise prescription for aerobic and resistive training based on initial evaluation findings, risk stratification, comorbidities and participant's personal goals.       Expected Outcomes  Achievement of increased cardiorespiratory fitness and enhanced flexibility, muscular endurance and strength shown through measurements of functional capacity and personal statement of participant.       Increase Strength and Stamina  Yes get back in shape. long term: be able to go fishing on the [employer] advice, education, support and counseling about physical activity/exercise needs.;Develop an individualized exercise prescription for aerobic and resistive training based on initial evaluation findings, risk stratification, comorbidities and participant's personal goals.       Expected Outcomes  Achievement of increased cardiorespiratory fitness and enhanced flexibility, muscular endurance and strength shown through measurements of functional capacity and personal statement of  participant.       Able to understand and use rate of perceived exertion (RPE) scale  Yes       Intervention  Provide education and explanation on how to use RPE scale       Expected Outcomes  Short Term: Able to use RPE daily in rehab to express subjective intensity level;Long Term:  Able to use RPE to guide intensity level when exercising independently       Knowledge and understanding of Target Heart Rate Range (THRR)  Yes       Intervention  Provide education and explanation of THRR including how the numbers were predicted and where they are located for reference       Expected Outcomes  Short Term: Able to state/look up THRR;Long Term: Able to use THRR to govern intensity when exercising independently;Short Term: Able to use daily as guideline for intensity in rehab       Able to check pulse independently  Yes       Intervention  Provide education and demonstration on how to check pulse in carotid and radial arteries.;Review the importance of being able to check your own pulse for safety during independent exercise       Expected Outcomes  Short Term: Able to explain why pulse checking is important during independent exercise;Long Term: Able to check pulse independently and accurately       Understanding of Exercise Prescription  Yes       Intervention  Provide education, explanation, and written materials on patient's individual exercise prescription       Expected Outcomes  Short Term: Able to explain program exercise prescription;Long Term: Able to explain home exercise prescription to exercise independently          Nutrition & Weight - Outcomes: Pre Biometrics - 05/11/17 1513      Pre Biometrics   Waist Circumference  43 inches    Hip Circumference  44 inches    Waist to Hip Ratio  0.98 %    Triceps Skinfold  12 mm    % Body Fat  28.7 %    Grip Strength  45 kg    Flexibility  0 in    Single Leg Stand  30 seconds        Nutrition: Nutrition Therapy & Goals - 05/12/17 0837       Nutrition Therapy   Diet  Therapeutic Lifestyle Changes      Personal Nutrition Goals   Nutrition Goal  Wt loss of 1-2 lb/week to a wt loss goal of 6-24 lb at graduation from [facility]. Long-term wt loss goal of 50 lb desired.       Intervention Plan   Intervention  Prescribe, educate and counsel regarding individualized specific dietary modifications aiming towards targeted core components such as weight, hypertension, lipid management, diabetes, heart failure and other comorbidities.    Expected Outcomes  Short Term Goal: Understand basic principles of dietary content, such as calories, fat, sodium, cholesterol and nutrients.;Long Term Goal: Adherence to prescribed nutrition plan.       Nutrition Discharge: Nutrition Assessments - 05/12/17 0838      MEDFICTS Scores   Pre Score  27       Education Questionnaire Score: Knowledge Questionnaire Score - 05/11/17 1629      Knowledge Questionnaire Score   Pre Score  22/24       Goals reviewed with patient; copy given to patient.[name] graduated from cardiac rehab program today with completion of 8 exercise sessions in Phase II. Pt maintained good attendance and progressed nicely during his participation in rehab as evidenced by increased MET level.   Medication list reconciled. Repeat  PHQ score-0  .  Pt has made significant lifestyle changes and should be commended for his success. Pt feels he has achieved his goals during cardiac rehab.   Pt plans to continue exercise at the gym and walk on his treadmill at home.Barnet Pall, RN,BSN 06/03/2017 1:54 PM.

## 2017-06-03 NOTE — Progress Notes (Signed)
QUALITY OF LIFE SCORE REVIEW  Pt completed Quality of Life survey as a participant in Cardiac Rehab. Scores 21.0 or below are considered low. Pt score very low in several areas Overall 13.44, Health and Function 16.00, socioeconomic 23.07, physiological and spiritual 9.43, family 10.50. Patient quality of life slightly altered by physical constraints which limits ability to perform as prior to recent cardiac illness.  William Mason scored low in several aeas.  Offered emotional support and reassurance.  Will continue to monitor and intervene as necessary.  William Mason discussed having some family stressors in his life that he does not want to elaborate on. An appointment has been made for William Mason to follow up with the hospital chaplain in the near future. William Mason is completing the program tomorrow so that he can return to work.Barnet Pall, RN,BSN 06/03/2017 1:27 PM

## 2017-06-04 ENCOUNTER — Encounter (HOSPITAL_COMMUNITY)
Admission: RE | Admit: 2017-06-04 | Discharge: 2017-06-04 | Disposition: A | Discharge status: Home / Self Care | Payer: BLUE CROSS/BLUE SHIELD | Source: Ambulatory Visit | Attending: Cardiology | Admitting: Cardiology

## 2017-06-04 VITALS — BP 106/62 | HR 79 | Ht 70.0 in | Wt 205.7 lb

## 2017-06-04 DIAGNOSIS — Z955 Presence of coronary angioplasty implant and graft: Secondary | ICD-10-CM

## 2017-06-04 DIAGNOSIS — Z48812 Encounter for surgical aftercare following surgery on the circulatory system: Secondary | ICD-10-CM | POA: Diagnosis not present

## 2017-06-04 NOTE — Progress Notes (Signed)
William Mason 56 y.o. male DOB: 01/06/1961 MRN: [id number]      Nutrition Note  1. Stented coronary artery    Nutrition Note Spoke with pt. Nutrition Plan and Nutrition Survey goals reviewed with pt. Pt is following a Heart Healthy diet. Pt expressed understanding of the information reviewed. Pt aware of nutrition education classes offered.  Nutrition Diagnosis ? Food-and nutrition-related knowledge deficit related to lack of exposure to information as related to diagnosis of: ? CVD ? Obesity related to excessive energy intake as evidenced by a BMI of 30.9  Nutrition Intervention ? Pt's individual nutrition plan and goals reviewed with pt.  Nutrition Goal(s):  ? Pt to identify food quantities necessary to achieve weight loss of 6-24 lb (2.7-10.9 kg) at graduation from cardiac rehab. Long-term wt loss goal of 50 lb desired.   Plan:  Pt to attend nutrition classes ? Nutrition I - met 05/25/17 ? Nutrition II ? Portion Distortion  Will provide client-centered nutrition education as part of interdisciplinary care.   Monitor and evaluate progress toward nutrition goal with team.  Derek Mound, M.Ed, RD, LDN, CDE 06/04/2017 12:15 PM

## 2017-06-07 ENCOUNTER — Encounter (HOSPITAL_COMMUNITY): Payer: BLUE CROSS/BLUE SHIELD

## 2017-06-09 ENCOUNTER — Encounter (HOSPITAL_COMMUNITY): Payer: BLUE CROSS/BLUE SHIELD

## 2017-06-14 ENCOUNTER — Encounter (HOSPITAL_COMMUNITY): Payer: BLUE CROSS/BLUE SHIELD

## 2017-06-16 ENCOUNTER — Encounter (HOSPITAL_COMMUNITY): Payer: BLUE CROSS/BLUE SHIELD

## 2017-06-18 ENCOUNTER — Encounter (HOSPITAL_COMMUNITY): Payer: BLUE CROSS/BLUE SHIELD

## 2017-06-21 ENCOUNTER — Encounter (HOSPITAL_COMMUNITY): Payer: BLUE CROSS/BLUE SHIELD

## 2017-06-23 ENCOUNTER — Encounter (HOSPITAL_COMMUNITY): Payer: BLUE CROSS/BLUE SHIELD

## 2017-06-23 ENCOUNTER — Telehealth: Payer: Self-pay | Admitting: Neurology

## 2017-06-23 NOTE — Telephone Encounter (Signed)
Patient called and stated that his insurance company will not approve his new CPAP machine. He would like to know if this is something we can appeal. Please call and advise.

## 2017-06-23 NOTE — Telephone Encounter (Signed)
Called the patient and there was no answer. LVM to make him aware that I reached out to [facility] and [facility] states that his machine is covered under his insurance. The machine is treated like a rental to keep the patient accountable for using the machine and being compliant with it. But insurance does cover it.   The patient has an apt that is scheduled with Robin on 08/05/2017. I have also left on the voicemail that he needs to call back once he has started the CPAP and we can work him in within 60-90 days after starting the machine. We need to cancel his apt on 1/17 once the patient calls back and we reschedule  Please advise the patient of this when the patient calls back and schedule him am apt within 60-90 days.

## 2017-06-24 NOTE — Telephone Encounter (Signed)
Called the patient back, the patient was very frustrated stating that he was told by [facility] and [employer] that he can have an appeal letter completed to cover his machine at full price. The patient states that he went thru this process to get this machine since he had met his deductible with all the procedures he has completed and he doesn't have the money to pay for another one. He states that if we are unable to get this machine covered for him at full price then he will have to stick to his old machine and " hope for the best". I informed him that I have never heard of an appeal letter being completed for this. The patient is adament in letting Dr Brett Fairy know and be aware of this and her writing a letter that would help him. I informed him that Dr Brett Fairy was out of the office and that she would not be returning until Dec 17. Pt verbalized understanding. In the meantime I have also reached back out to [facility] to inquire more about the letter that he is asking about.

## 2017-06-24 NOTE — Telephone Encounter (Signed)
Pt returned RN's call. Pt requested to speak with Rn.

## 2017-06-25 ENCOUNTER — Encounter (HOSPITAL_COMMUNITY): Payer: BLUE CROSS/BLUE SHIELD

## 2017-06-28 ENCOUNTER — Encounter (HOSPITAL_COMMUNITY): Payer: BLUE CROSS/BLUE SHIELD

## 2017-06-29 NOTE — Addendum Note (Signed)
Encounter addended by: Jewel Baize, RD on: 06/29/2017 9:59 AM  Actions taken: Flowsheet data copied forward, Visit Navigator Flowsheet section accepted

## 2017-06-29 NOTE — Addendum Note (Signed)
Encounter addended by: Sol Passer on: 06/29/2017 8:47 AM  Actions taken: Flowsheet data copied forward, Visit Navigator Flowsheet section accepted, Vitals modified

## 2017-06-29 NOTE — Addendum Note (Signed)
Encounter addended by: Sol Passer on: 06/29/2017 10:23 AM  Actions taken: Flowsheet data copied forward, Flowsheet accepted

## 2017-06-30 ENCOUNTER — Encounter (HOSPITAL_COMMUNITY): Payer: BLUE CROSS/BLUE SHIELD

## 2017-07-02 ENCOUNTER — Encounter (HOSPITAL_COMMUNITY): Payer: BLUE CROSS/BLUE SHIELD

## 2017-07-05 ENCOUNTER — Encounter (HOSPITAL_COMMUNITY): Payer: BLUE CROSS/BLUE SHIELD

## 2017-07-05 NOTE — Telephone Encounter (Signed)
William Mason has been compliant CPAP patient and is in need of a new CPAP machine. The order for a new CPAP machine was written on 2 November and should have given the patient opportunity within this year to show compliance for insurance coverage.  The order was forwarded on 5 November to [facility].  I am not privy to the details of William Mason insurance, but since he had met his deductible he should have not encountered new costs during this calendar year. I will contact [facility] to see if the costs are prorated (?) .  Jeneen Rinks at [employer] will meet with [place] at lunch tomorrow and get eck with insurance information.

## 2017-07-07 ENCOUNTER — Encounter (HOSPITAL_COMMUNITY): Payer: BLUE CROSS/BLUE SHIELD

## 2017-07-07 NOTE — Telephone Encounter (Signed)
Dr Dohmeier spoke with Jeneen Rinks from [facility] to see what solution we could come up with and they have questioned setting the patient up with a refurbished machine to help the patient out. [facility] will contact the patient to discuss what they can offer the patient to help him out. Will wait to hear back from [facility] on what the patient decides to do.

## 2017-07-09 ENCOUNTER — Encounter (HOSPITAL_COMMUNITY): Payer: BLUE CROSS/BLUE SHIELD

## 2017-07-12 ENCOUNTER — Encounter (HOSPITAL_COMMUNITY): Payer: BLUE CROSS/BLUE SHIELD

## 2017-07-14 ENCOUNTER — Encounter (HOSPITAL_COMMUNITY): Payer: BLUE CROSS/BLUE SHIELD

## 2017-07-15 NOTE — Telephone Encounter (Signed)
"  I have reached out to [name] to schedule setting him up on a Donated machine at no cost. I have Left Voicemails with no return calls for 3 consecutive days. Today was my final attempt and we will wait to hear back from him."  This was sent to me from [place] and the patient still has not responded to them. I will contact the patient today and make him aware of this as well.

## 2017-07-15 NOTE — Telephone Encounter (Signed)
I have reached out the the patient to make him aware that Dr Brett Fairy is aware of his situation and has spoken personally with [employer] Jeneen Rinks to see if there was something that they could offer to help him get a better machine. They were aggreable to offer the pt a donated machine at no cost. [facility] has reached out multiple times and I LVM stating that the patient should contact [facility]. I also stated he could call us if he has any concerns.

## 2017-07-16 ENCOUNTER — Encounter (HOSPITAL_COMMUNITY): Payer: BLUE CROSS/BLUE SHIELD

## 2017-07-19 ENCOUNTER — Encounter (HOSPITAL_COMMUNITY): Payer: BLUE CROSS/BLUE SHIELD

## 2017-07-21 ENCOUNTER — Encounter (HOSPITAL_COMMUNITY): Payer: BLUE CROSS/BLUE SHIELD

## 2017-07-23 ENCOUNTER — Encounter (HOSPITAL_COMMUNITY): Payer: BLUE CROSS/BLUE SHIELD

## 2017-07-26 ENCOUNTER — Encounter (HOSPITAL_COMMUNITY): Payer: BLUE CROSS/BLUE SHIELD

## 2017-07-28 ENCOUNTER — Encounter (HOSPITAL_COMMUNITY): Payer: BLUE CROSS/BLUE SHIELD

## 2017-07-30 ENCOUNTER — Encounter (HOSPITAL_COMMUNITY): Payer: BLUE CROSS/BLUE SHIELD

## 2017-08-02 ENCOUNTER — Encounter (HOSPITAL_COMMUNITY): Payer: BLUE CROSS/BLUE SHIELD

## 2017-08-04 ENCOUNTER — Encounter (HOSPITAL_COMMUNITY): Payer: BLUE CROSS/BLUE SHIELD

## 2017-08-05 ENCOUNTER — Ambulatory Visit: Payer: Self-pay

## 2017-08-06 ENCOUNTER — Encounter (HOSPITAL_COMMUNITY): Payer: BLUE CROSS/BLUE SHIELD

## 2017-08-09 ENCOUNTER — Encounter (HOSPITAL_COMMUNITY): Payer: BLUE CROSS/BLUE SHIELD

## 2017-08-11 ENCOUNTER — Encounter (HOSPITAL_COMMUNITY): Payer: BLUE CROSS/BLUE SHIELD

## 2017-08-13 ENCOUNTER — Encounter (HOSPITAL_COMMUNITY): Payer: BLUE CROSS/BLUE SHIELD

## 2017-08-16 ENCOUNTER — Encounter (HOSPITAL_COMMUNITY): Payer: BLUE CROSS/BLUE SHIELD

## 2017-08-18 ENCOUNTER — Encounter (HOSPITAL_COMMUNITY): Payer: BLUE CROSS/BLUE SHIELD

## 2017-08-20 ENCOUNTER — Encounter (HOSPITAL_COMMUNITY): Payer: BLUE CROSS/BLUE SHIELD

## 2017-12-07 IMAGING — US US CAROTID DUPLEX BILAT
1 series · 13 of 24 positions shown · non-contrast
Comparison: None.

CLINICAL DATA: Syncope

EXAM:
BILATERAL CAROTID DUPLEX ULTRASOUND
TECHNIQUE: Gray scale imaging, color Doppler and duplex ultrasound were
performed of bilateral carotid and vertebral arteries in the neck.

[Series 1: us carotid duplex bilat · 0.06mm/px · 13 of 89 slices shown]
[im 1/89]
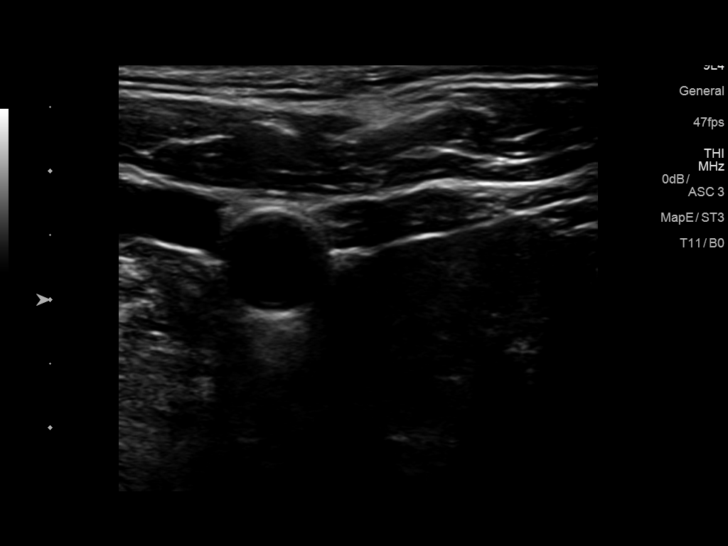
[im 8/89]
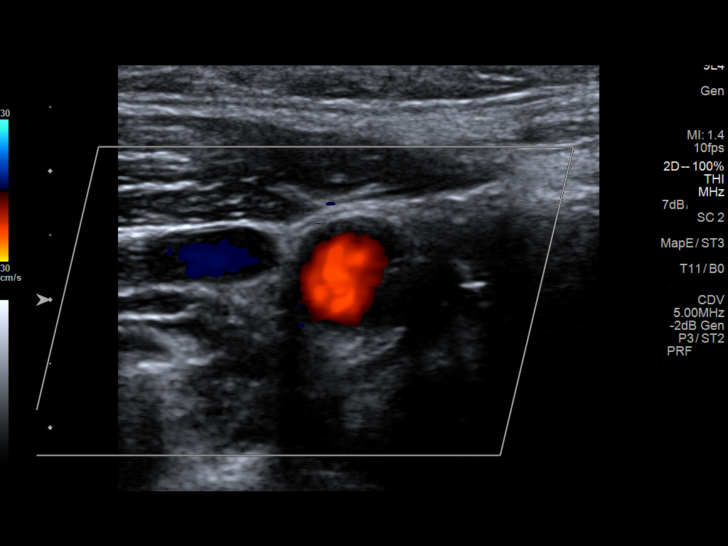
[im 16/89]
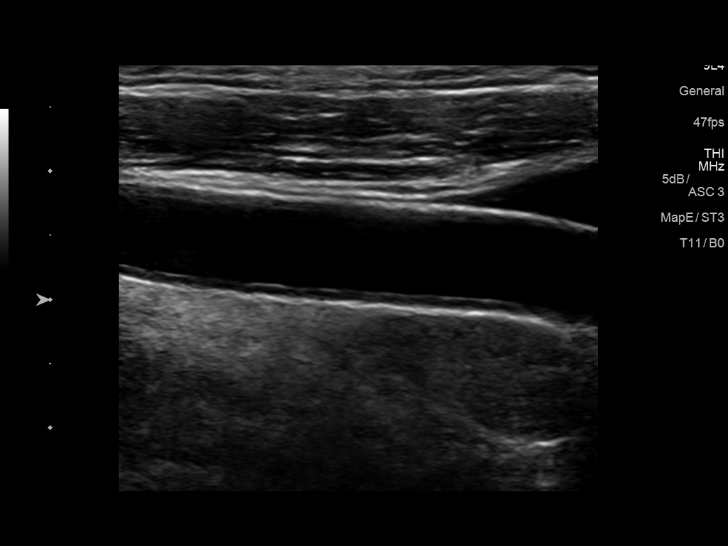
[im 23/89]
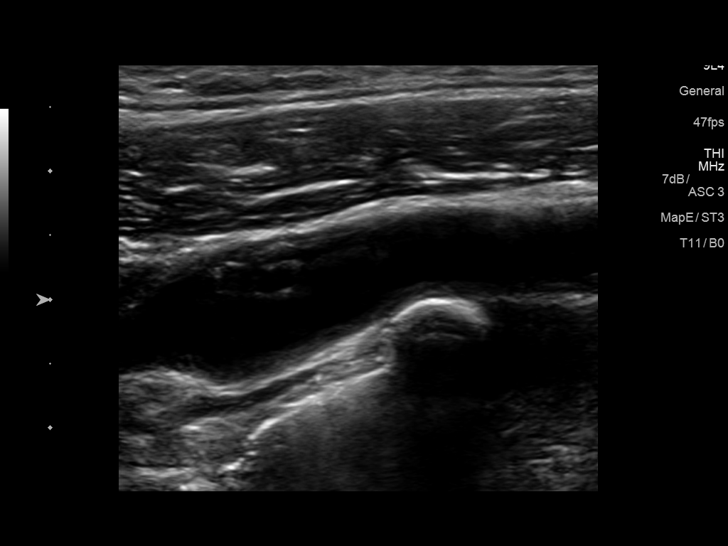
[im 31/89]
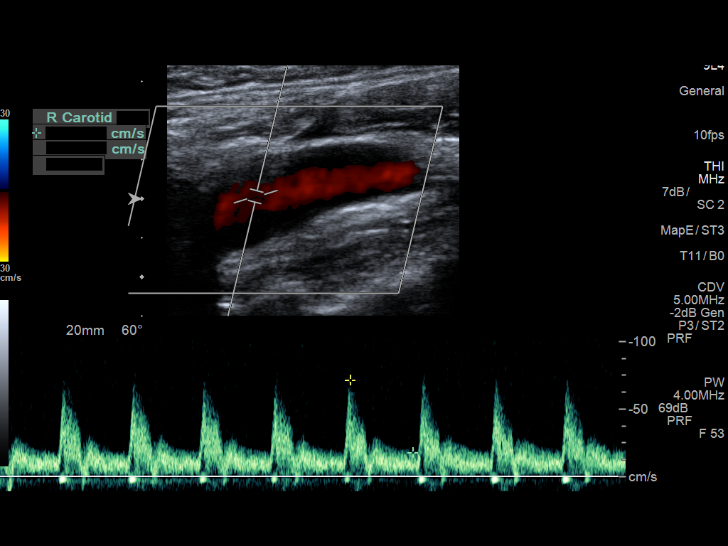
[im 39/89]
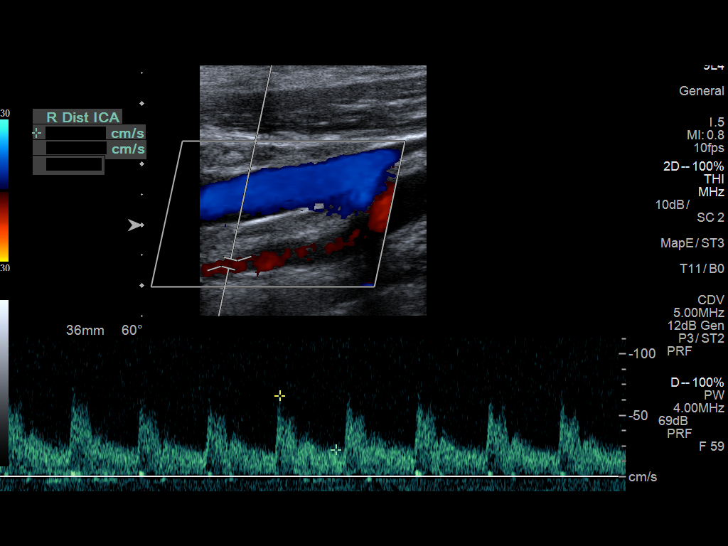
[im 46/89]
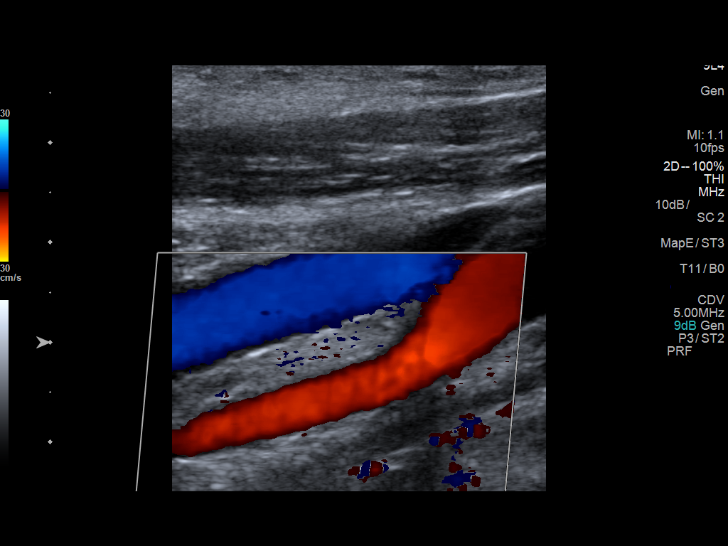
[im 50/89]
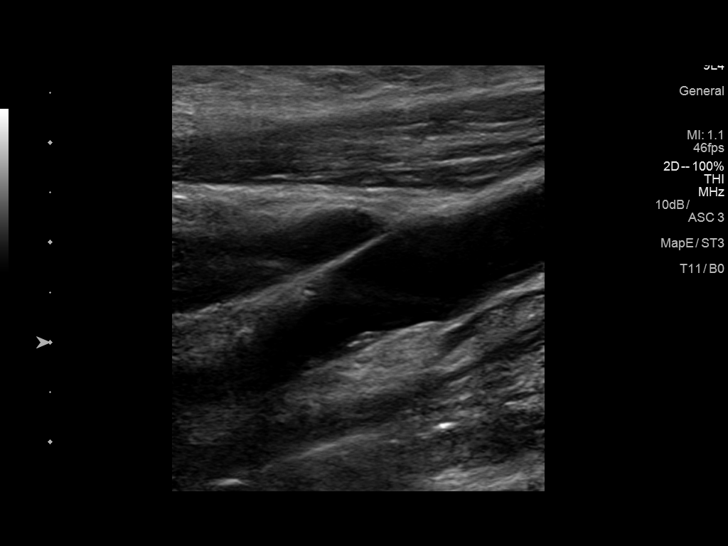
[im 58/89]
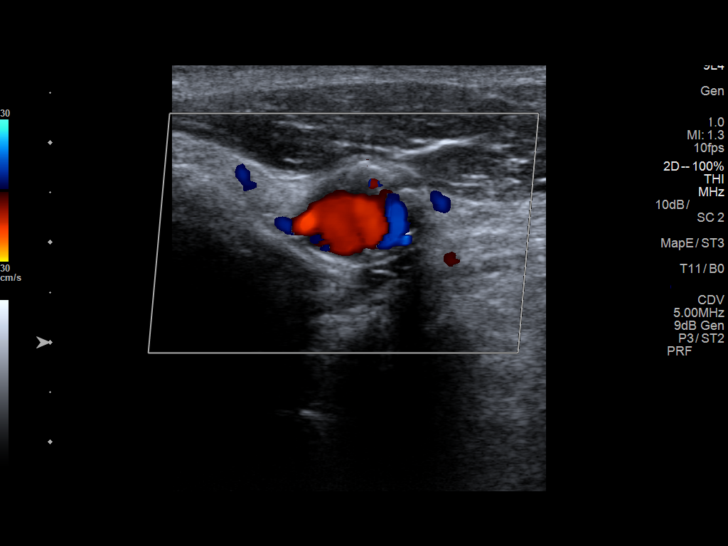
[im 66/89]
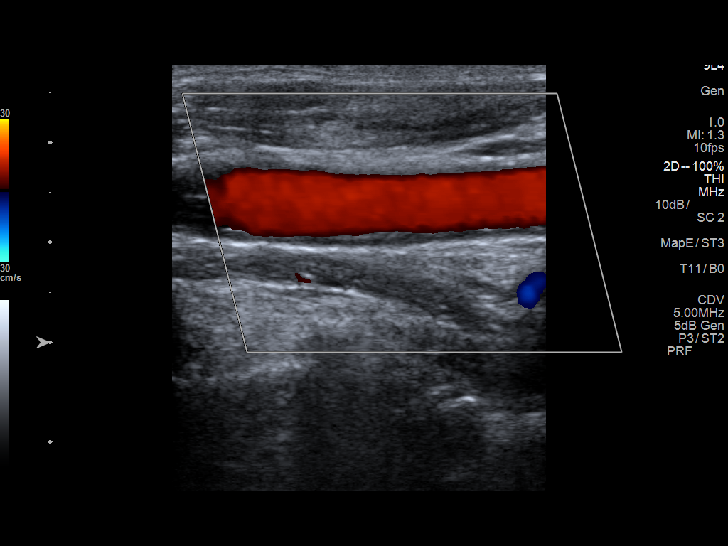
[im 73/89]
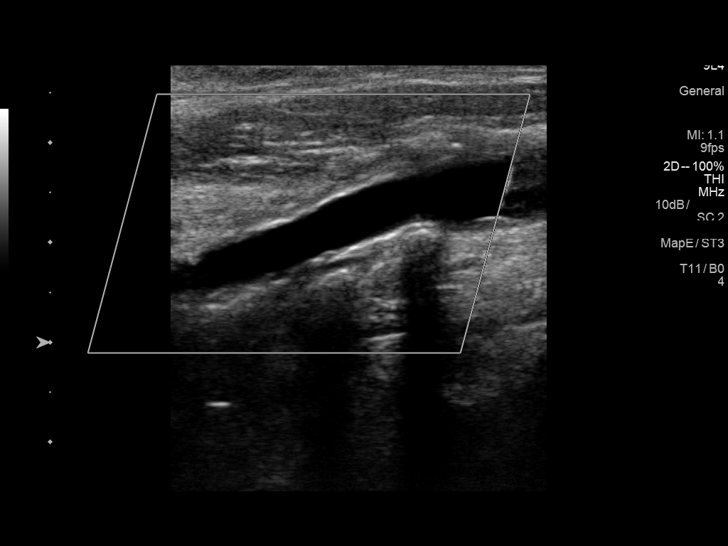
[im 81/89]
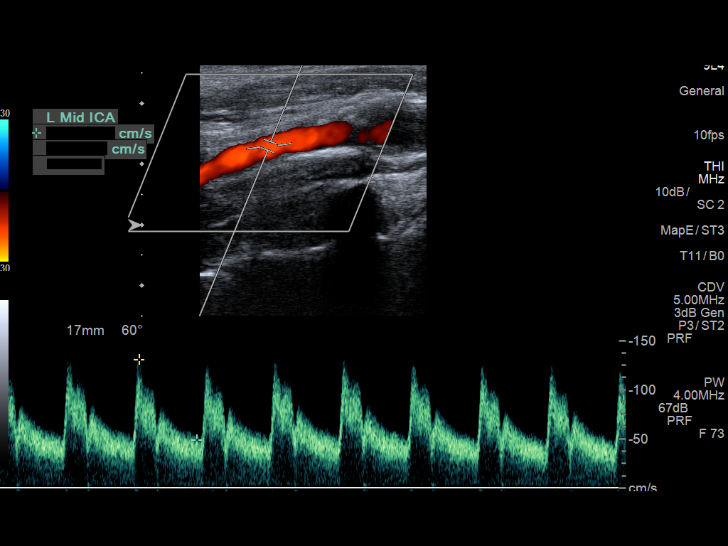
[im 89/89]
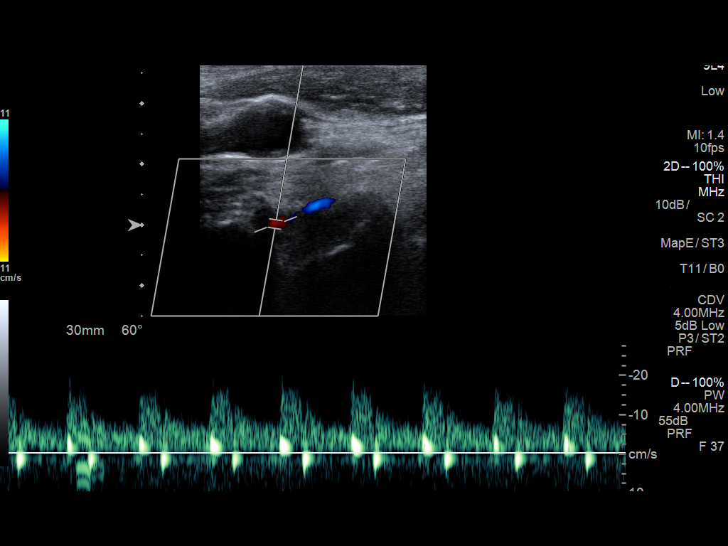

[13 of 24 positions shown; findings below may reference images not displayed]

FINDINGS: Criteria: Quantification of carotid stenosis is based on velocity
parameters that correlate the residual internal carotid diameter
with NASCET-based stenosis levels, using the diameter of the distal
internal carotid lumen as the denominator for stenosis measurement.

The following velocity measurements were obtained:

RIGHT

ICA:  82 cm/sec

CCA:  109 cm/sec

SYSTOLIC ICA/CCA RATIO:

DIASTOLIC ICA/CCA RATIO:

ECA:  151 cm/sec

LEFT

ICA:  131 cm/sec

CCA:  98 cm/sec

SYSTOLIC ICA/CCA RATIO:

DIASTOLIC ICA/CCA RATIO:

ECA:  101 cm/sec

RIGHT CAROTID ARTERY: Mild irregular mixed plaque in the bulb. Low
resistance internal carotid Doppler pattern.

RIGHT VERTEBRAL ARTERY:  Antegrade.

LEFT CAROTID ARTERY: Moderate irregular calcified plaque in the
bulb. Low resistance internal carotid Doppler pattern.

LEFT VERTEBRAL ARTERY:  Antegrade.
IMPRESSION: Less than 50% stenosis in the right internal carotid artery.

50-69% stenosis in the left internal carotid artery.

## 2018-02-10 ENCOUNTER — Encounter: Payer: Self-pay | Admitting: Neurology

## 2018-02-10 ENCOUNTER — Encounter

## 2018-02-10 ENCOUNTER — Ambulatory Visit (INDEPENDENT_AMBULATORY_CARE_PROVIDER_SITE_OTHER): Payer: Managed Care, Other (non HMO) | Admitting: Neurology

## 2018-02-10 VITALS — BP 129/78 | HR 75 | Ht 71.0 in | Wt 215.0 lb

## 2018-02-10 DIAGNOSIS — I2510 Atherosclerotic heart disease of native coronary artery without angina pectoris: Secondary | ICD-10-CM | POA: Insufficient documentation

## 2018-02-10 DIAGNOSIS — G4733 Obstructive sleep apnea (adult) (pediatric): Secondary | ICD-10-CM

## 2018-02-10 DIAGNOSIS — Z9989 Dependence on other enabling machines and devices: Secondary | ICD-10-CM

## 2018-02-10 DIAGNOSIS — I25118 Atherosclerotic heart disease of native coronary artery with other forms of angina pectoris: Secondary | ICD-10-CM | POA: Diagnosis not present

## 2018-02-10 DIAGNOSIS — G4719 Other hypersomnia: Secondary | ICD-10-CM

## 2018-02-10 NOTE — Progress Notes (Signed)
[facility]   Provider:  Larey Seat, M D  Primary Care Physician:  Jani Gravel, MD   Referring Provider: Jani Gravel, MD   HPI:  William Mason is a 57 y.o. male , is now here for a revisit- reporting excessive daytime sleepiness while using his old CPAP compliantly.   We no longer have the software- last download was 5 years ago, according to patient., [facility].   [name] was seen October 2018  here as in a referral  from Dr. Maudie Mercury for a sleep apnea re- evaluation.  Mr. Jani was last seen in October 2018 when he underwent a HST sleep study to confirm that he still had apnea and of what kind.  The sleep study returned positive AHI of 13.2/h and a CPAP order had been issued, but the patient learned that also he had met his out-of-pocket cost and deductible the insurance company did not want to cover a new CPAP.  They actually asked the patient to return to the machine for 1 year which is these days a very common request, but would have added cost to the patient. He is now in need of a new CPSAP, uses compliantly his old machine. It's not downloadable. He is more fatigued.  Epworth 16/ 24. Had recenlty a heat stroke. See last years HST below. CD    NAME:  William Mason                                                             DOB: 12-23-1960 MEDICAL RECORD NUMBER [id number]                                         DOS: 05/19/2017 REFERRING PHYSICIAN: Jani Gravel, M.D.  STUDY PERFORMED: Home Sleep Study HISTORY:   57 year old male patient of Dr. Julianne Rice, who tested positive for OSA 2011.  He was placed on CPAP at that time. Pt has not been followed up with since.  Pt is in need of new machine and equipment. BMI 31.2. Epworth Sleepiness Score endorsed at 15/24 points, FSS 20 points,         STUDY RESULTS: Total Recording Time:  8 hours, 37 minutes Total Apnea/Hypopnea Index (AHI):   13.2 /hour and RDI was 15.6 /hr. Average Oxygen Saturation SpO2 at 91%; Lowest Oxygen  Saturation 80 %  Total Time in Oxygen Saturation Below 89%: 66 minutes= 13 % Average Heart Rate: 71 bpm (60 - 113 bpm)  IMPRESSION: Mild and mainly obstructive sleep apnea with prolonged desaturation time- this constellation warrants CPAP therapy.  RECOMMENDATION: Auto-titrating CPAP with a window of 5-12 cm water pressure, with mask of choice, heated humidity. I certify that I have reviewed the raw data recording prior to the issuance of this report in accordance with the standards of the American Academy of Sleep Medicine (AASM). Larey Seat, M.D.     05-21-2017    Mr. William Mason is a 57 year old right-handed Caucasian patient of Dr. Maudie Mercury. He remembers a sleep study taking place about 7 years ago at [facility] on [address] in the basement of Dr. Julianne Rice office. He was then placed on CPAP and has used it  ever since. He still uses his first machine with the same settings that were prescribed in 2011. In the meantime he has sometimes lost weight and sometimes gained weight, he may have been as heavy as 220 pounds at the time of his sleep study. He also remembers that changes in his body mass index seemed to not correlate with changes in his apnea frequency or his need for CPAP. Dr. Maudie Mercury initiated a consultation with Dr. Vinetta Bergamo, an otolaryngologist at [facility]. Dr. Vinetta Bergamo was fascinated with the epiglottal anatomy of the patient. However no specific treatment could be initiated or resulted. Dr. Vinetta Bergamo discouraged at the time a UPPP.  Mr. William Mason is here today because he hasn't had follow-up for his CPAP needs, does not know if he has a certain degree of apnea or if he is perhaps even apnea free.  He reportedly has used his CPAP compliantly ever since it was issued and when he doesn't use CPAP (during an unscheduled nap for example), he feels that his sleep is not is refreshing and restorative.  Mr. William Mason is most recent encounter with Dr. Maudie Mercury was related to a syncope. It is presumed that he  had a heat stroke. Extensive laboratory testing showed elevated transaminases, elevated monocytes, elevated hematocrit. I do not think that he was dehydrated as his BUN was 14.2. Glucose was normal. There was no evidence of a silent myocardial infarction a CT of the head without contrast was negative, carotid duplex was normal, cardiac stress test by treadmill was abnormal. His  echocardiograms is pending .  Sleep habits are as follows: Last hour before bedtime : feeds the cats, and watches TV in the living room, goes to bed at about 11 Pm, his wife is already in bed by 9 PM. The patient has at least one bathroom break usually in the early morning hours( 3-4 AM) and can go back to sleep after that. He is using CPAP every night. He averages at least 7 hours of nocturnal sleep. He does not report any sleep choking, palpitations, diaphoresis at night. Over the last year he has developed very vivid sometimes bizarre dreams, lucid dreams.. These include encounters with now deceased family members etc.and  he has not enacted dreams. He gets up at 6.30.    Sleep medical history and family sleep history: single child, both parents snored. Mother has dementia. Tonsillectomy at age 77-5 , and nasal septoplasty at age 59 and broke nose again in high school .  Social history: he works outdoors, Biomedical scientist. married with 5 cats. Non smoker - non drinker, caffeine : 1 cup a day ( coffee) no iced tea, nor soda.   Review of Systems: Out of a complete 14 system review, the patient complains of only the following symptoms, and all other reviewed systems are negative.  How likely are you to doze in the following situations: 0 = not likely, 1 = slight chance, 2 = moderate chance, 3 = high chance  Sitting and Reading? 3 Watching Television? 3 Sitting inactive in a public place (theater or meeting)?3 As a passenger in a car for an hour without a break?2  Lying down in the afternoon when circumstances permit?3 Sitting  and talking to someone?0 Sitting quietly after lunch without alcohol? 2 In a car, while stopped for a few minutes in traffic?0   Total = Epworth score 16/24 , Fatigue severity score 20 , depression score 1/15    Social History   Socioeconomic History  . Marital status:  Married    Spouse name: Not on file  . Number of children: Not on file  . Years of education: Not on file  . Highest education level: Not on file  Occupational History  . Not on file  Social Needs  . Financial resource strain: Not on file  . Food insecurity:    Worry: Not on file    Inability: Not on file  . Transportation needs:    Medical: Not on file    Non-medical: Not on file  Tobacco Use  . Smoking status: Never Smoker  . Smokeless tobacco: Former Systems developer    Types: Chew  Substance and Sexual Activity  . Alcohol use: Yes    Comment: 04/27/2017 "1-2 drinks/year; if that"  . Drug use: No  . Sexual activity: Not Currently  Lifestyle  . Physical activity:    Days per week: Not on file    Minutes per session: Not on file  . Stress: Not on file  Relationships  . Social connections:    Talks on phone: Not on file    Gets together: Not on file    Attends religious service: Not on file    Active member of club or organization: Not on file    Attends meetings of clubs or organizations: Not on file    Relationship status: Not on file  . Intimate partner violence:    Fear of current or ex partner: Not on file    Emotionally abused: Not on file    Physically abused: Not on file    Forced sexual activity: Not on file  Other Topics Concern  . Not on file  Social History Narrative  . Not on file    Family History  Problem Relation Age of Onset  . Breast cancer Maternal Grandmother   . Leukemia Paternal Grandfather   . Heart disease Father   . COPD Father   . Colon cancer Neg Hx   . Esophageal cancer Neg Hx   . Rectal cancer Neg Hx   . Stomach cancer Neg Hx     Past Medical History:  Diagnosis  Date  . Arthritis    "knees, shoulders, back" (04/27/2017)  . Chronic back pain    "5 ruptured discs in the middle; 1 ruptured disc in the lower" (04/27/2017)  . Depression   . Diverticulitis   . Exertional heat stroke ~ 01/2017  . Family history of adverse reaction to anesthesia    "mom had emergency appy at age 38; she had severe short term memory loss; never recovered"  . GERD (gastroesophageal reflux disease)   . History of hiatal hernia   . Hyperlipidemia   . OSA on CPAP     Past Surgical History:  Procedure Laterality Date  . CORONARY ANGIOPLASTY WITH STENT PLACEMENT  04/27/2017  . CORONARY STENT INTERVENTION N/A 04/27/2017   Procedure: CORONARY STENT INTERVENTION;  Surgeon: Nigel Mormon, MD;  Location: [facility];  Service: Cardiovascular;  Laterality: N/A;  . LEFT HEART CATH AND CORONARY ANGIOGRAPHY N/A 04/27/2017   Procedure: LEFT HEART CATH AND CORONARY ANGIOGRAPHY;  Surgeon: Nigel Mormon, MD;  Location: [facility];  Service: Cardiovascular;  Laterality: N/A;  . SHOULDER SURGERY Left ~ 1992   "tendon relocation; cut me open to do it"  . SURGERY SCROTAL / TESTICULAR Left ~ 1980   "cyst removed"  . TONSILLECTOMY      Current Outpatient Medications  Medication Sig Dispense Refill  . aspirin 81  MG chewable tablet Chew 1 tablet (81 mg total) by mouth daily. 90 tablet 6  . cetirizine (ZYRTEC) 10 MG tablet Take 10 mg by mouth daily.     . clopidogrel (PLAVIX) 75 MG tablet Take 1 tablet (75 mg total) by mouth daily. 90 tablet 3  . diazepam (VALIUM) 5 MG tablet Take 5 mg by mouth 2 (two) times daily as needed for muscle spasms.     . DULoxetine (CYMBALTA) 30 MG capsule Take 30 mg by mouth 2 (two) times daily.     Marland Kitchen ezetimibe (ZETIA) 10 MG tablet Take 10 mg by mouth daily.    . fluticasone (FLONASE) 50 MCG/ACT nasal spray Place 1 spray into both nostrils at bedtime.    . metaxalone (SKELAXIN) 800 MG tablet Take 800 mg by mouth daily as needed for  muscle spasms.     . metoprolol succinate (TOPROL-XL) 25 MG 24 hr tablet Take 37.5 mg by mouth daily.     Marland Kitchen oxyCODONE-acetaminophen (PERCOCET/ROXICET) 5-325 MG tablet Take 1 tablet by mouth every 6 (six) hours as needed for severe pain.    . pantoprazole (PROTONIX) 40 MG tablet Take 1 tablet (40 mg total) by mouth daily. 30 tablet 6  . Polyethyl Glycol-Propyl Glycol (SYSTANE ULTRA) 0.4-0.3 % SOLN Place 1-2 drops into both eyes daily as needed (eye irritation).    . rosuvastatin (CRESTOR) 20 MG tablet Take 1 tablet (20 mg total) by mouth at bedtime. 30 tablet 11  . traMADol (ULTRAM) 50 MG tablet Take 50 mg by mouth every 6 (six) hours as needed for moderate pain.     No current facility-administered medications for this visit.     Allergies as of 02/10/2018 - Review Complete 02/10/2018  Allergen Reaction Noted  . Vicodin [hydrocodone-acetaminophen] Nausea Only 10/26/2011    Vitals: Last Weight:  Wt Readings from Last 1 Encounters:  02/10/18 215 lb (97.5 kg)   BMI:    Last Height:   Ht Readings from Last 1 Encounters:  02/10/18 _0  (1.803 m)    Physical exam:  General: The patient is awake, alert and appears not in acute distress. The patient is well groomed. Head: Normocephalic, atraumatic. Neck is supple. Mallampati , 1- uvula lifts completely  neck circumference: 17. Nasal airflow patent , TMJ click is not  evident . Retrognathia is seen.  Used to wear braces top only.  Cardiovascular:  Regular rate and rhythm , without  murmurs or carotid bruit, and without distended neck veins. Respiratory: Lungs are clear to auscultation. Skin:  Without evidence of edema, or rash Trunk: BMI is 31.66 The patient's posture is erect   Neurologic exam: Attention span & concentration ability appears normal.  Speech is fluent,  without  dysarthria, dysphonia or aphasia.  Mood and affect are appropriate.  Cranial nerves: Pupils are equal and briskly reactive to light. Funduscopic exam without   evidence of pallor or edema. Extraocular movements  in vertical and horizontal planes intact and without nystagmus. Visual fields by finger perimetry are intact.Hearing to finger rub intact.  Facial sensation intact to fine touch. Facial motor strength is symmetric and tongue and uvula move midline. Shoulder shrug was symmetrical.  Motor exam:  Normal tone, muscle bulk and symmetric strength in all extremities. He has shoulder pain and crepitus in both shoulders. Dislocated biceps tendon on the right, rotator cuff injury.  Sensory:  Fine touch, pinprick and vibration were tested in all extremities. Proprioception tested in the upper extremities was normal. Numbness between big  toe and neighbor on the left foot. S1?  Coordination:  Finger-to-nose maneuver  normal without evidence of ataxia, dysmetria or tremor. Gait and station: Patient walks without assistive device - Strength within normal limits.Stance is stable and normal.   Deep tendon reflexes: in the  upper and lower extremities are symmetric and intact. Babinski maneuver response is downgoing.  Assessment:  After physical and neurologic examination, review of laboratory studies,  Personal review of imaging studies, reports of other /same  Imaging studies, results of polysomnography and / or neurophysiology testing and pre-existing records as far as provided in visit., my assessment is   1) since I have no access to the original sleep study and cannot interrogate a 57 year old ResMed CPAP anymore - software is no longer available. I only option is to repeat a sleep study see at what level of apnea the patient currently is affected if he still has apnea, and then work with an auto titrate him - he ois covered by [employer].   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   The patient was advised of the nature of the diagnosed disorder , the treatment options and the  risks for general  health and wellness arising from not treating the condition.   Both CAD and  HTN-  and also OSA can contribute to fatigue, he has lost weight about 15 pounds- and still feels fatigued.   I spent more than 25 minutes of face to face time with the patient. Greater than 50% of time was spent in counseling and coordination of care. We have discussed the diagnosis and differential and I answered the patient's questions.    In order to obtain a new CPAP autotitration machine- I will need to repeat the HST. He may have to bite into the sour grape and accept a new CPAP on a rental basis.  I will place a new order now- .     Larey Seat, MD 8/40/3979, 5:36 PM  Certified in Neurology by ABPN Certified in [place] by [facility] [address], [place], [place] [address]

## 2018-02-15 ENCOUNTER — Telehealth: Payer: Self-pay

## 2018-03-03 ENCOUNTER — Telehealth: Payer: Self-pay | Admitting: Neurology

## 2018-03-03 NOTE — Telephone Encounter (Signed)
Called the patient and made him aware of the conversation that was had between me and the insurance rep. Informed him that he has to rent the machine until purchased and show compliance with CPAP. Patient wasn't happyu but I did offer the call reference number between me and the representative. He was appreciative for the call and attempting to help.

## 2018-03-03 NOTE — Telephone Encounter (Signed)
Pt calling back checking on status of letter he dropped off he rec'd from his insurance reg CPAP. He said Dr D wanted to see the letter to try to help. Please call to advise of the status

## 2018-03-03 NOTE — Telephone Encounter (Signed)
I called the number that was listed on the letter that was provided to the patient and the auto mative system was unable to find the patient with the member ID that was provided. I then contacted the number that was on the back of the patient's insurance card and was able to speak with a representative Max F who basically informed me of what I had already instructed the pt and that was that insurance requires they rent the machine up to 10 months or purchase of the machine. I went over in detail with them the letter that was provided to me and date and what the pt thought and she states that she could see where the pt placed a inquiry letter on 06/09/18 but as mentioned per insurance guidelines this is not an option. I stated that the patient was informed that if he had the doctor call on his behalf that there was something that we may can do to help facilitate this option being provided and I was told that is not the case.  Call reference # [id number].  I will call the patient with this information

## 2018-03-16 IMAGING — CT CT HEAD W/O CM
3 of 4 series · 16 of 47 positions shown, 19 images · non-contrast
Comparison: 02/04/2010

CLINICAL DATA: Headache with syncope.

EXAM:
CT HEAD WITHOUT CONTRAST
TECHNIQUE: Contiguous axial images were obtained from the base of the skull
through the vertex without intravenous contrast.

[Series 32: 3d filtered head w/o · axial · non-contrast · 0.49mm/px · z∈[-32,+103]mm · 10 of 33 slices shown, 13 images]
[im 3/33  brain]
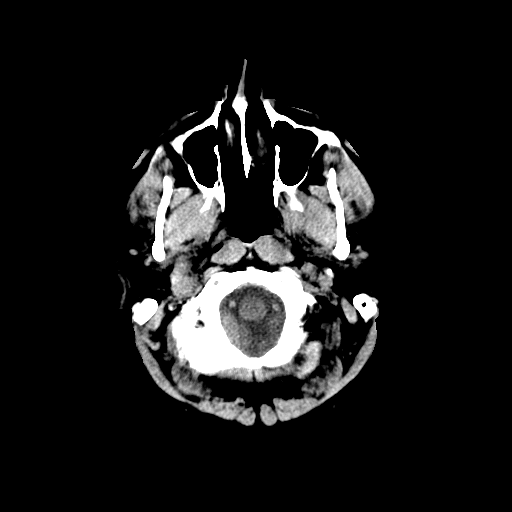
[im 3/33  bone]
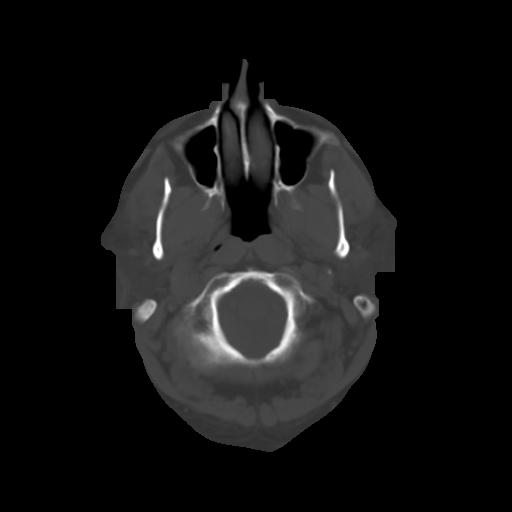
[im 5/33  brain]
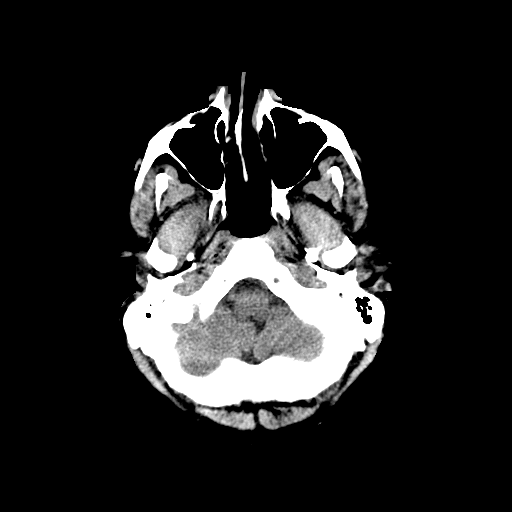
[im 10/33  brain]
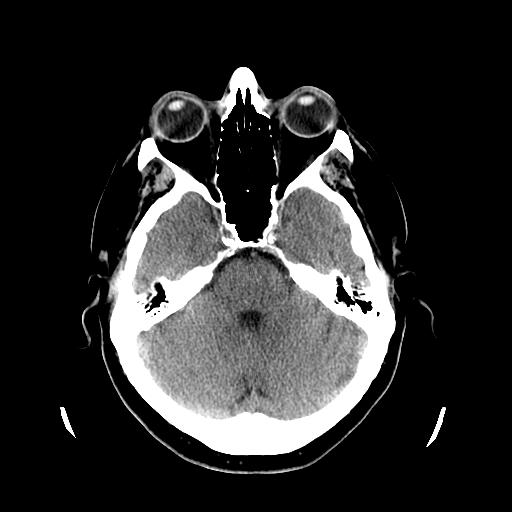
[im 12/33  brain]
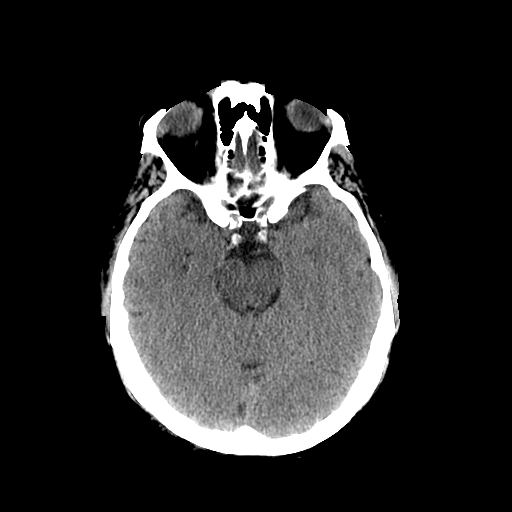
[im 14/33  brain]
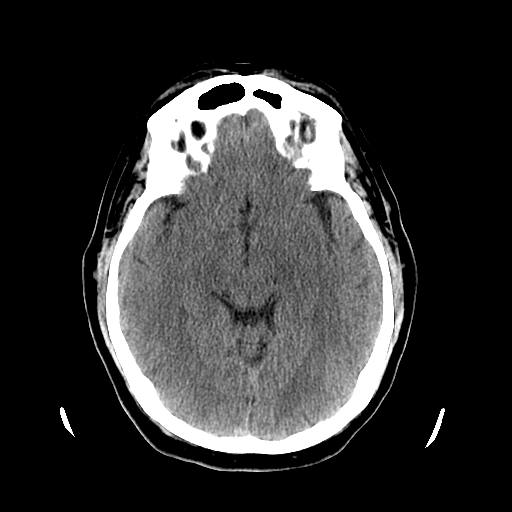
[im 14/33  bone]
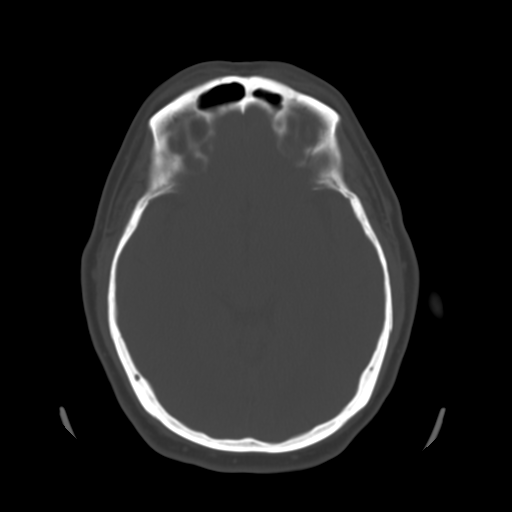
[im 19/33  brain]
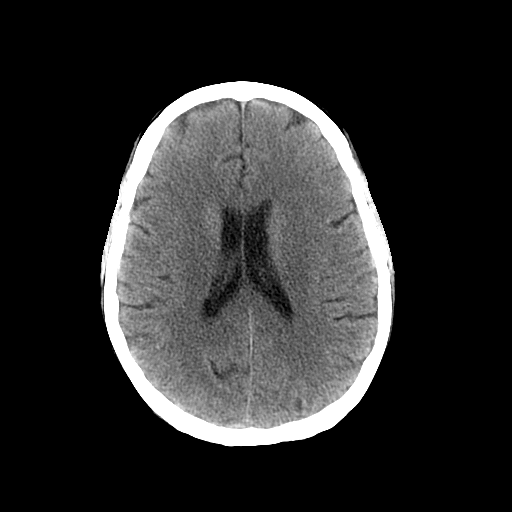
[im 21/33  brain]
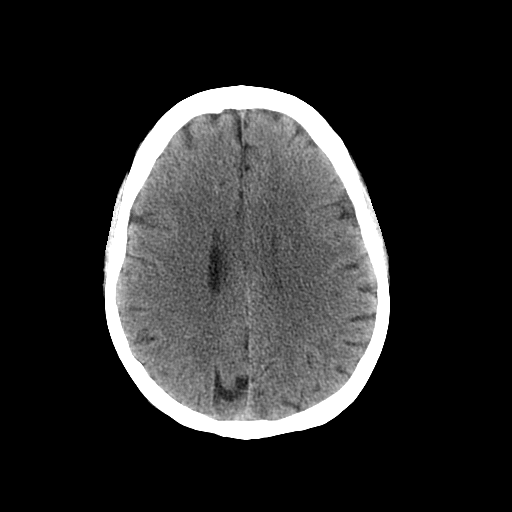
[im 23/33  brain]
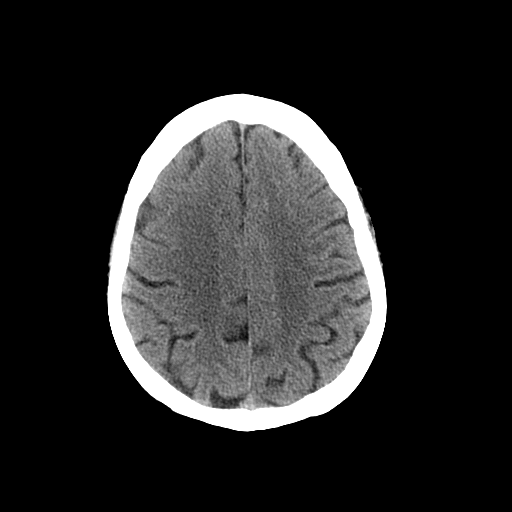
[im 28/33  brain]
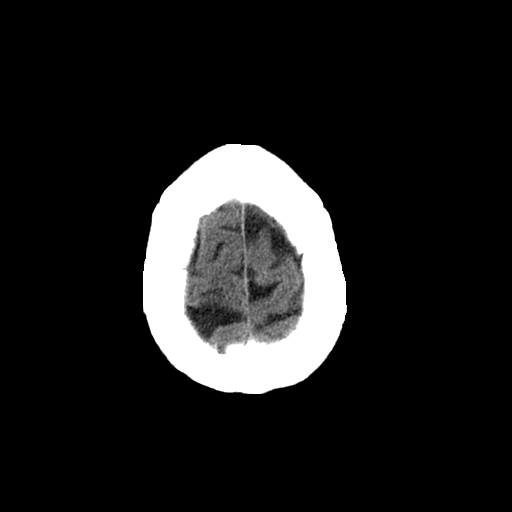
[im 28/33  bone]
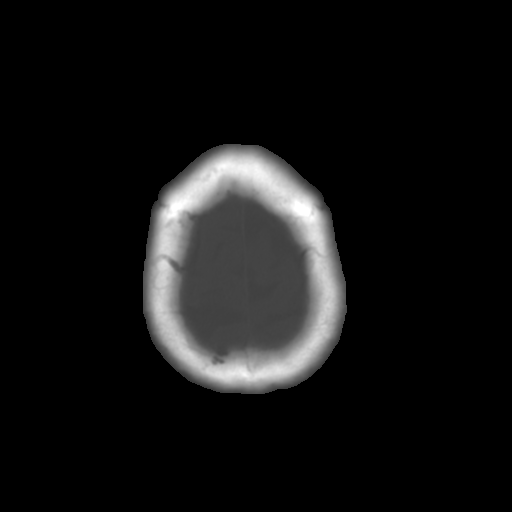
[im 30/33  brain]
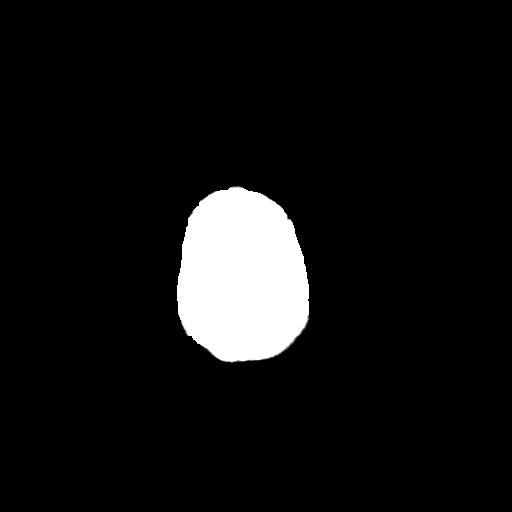

[Series 601: coronal brain · coronal · 0.49mm/px · 3 of 73 slices shown]
[im 25/73  brain]
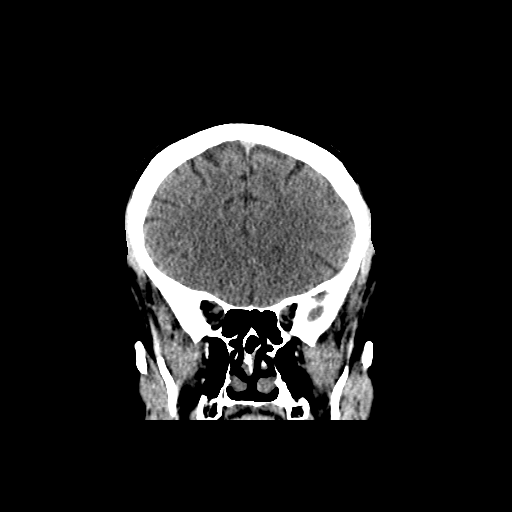
[im 33/73  brain]
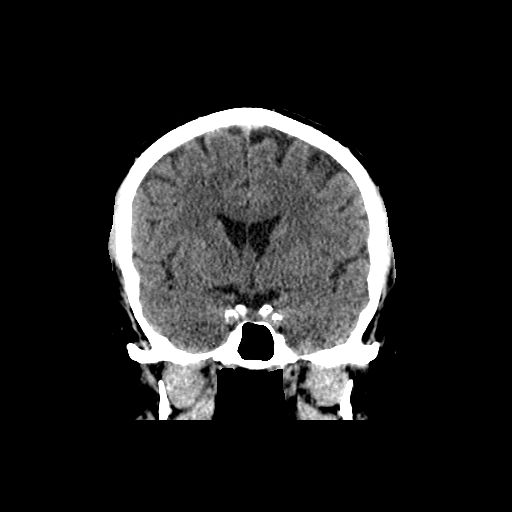
[im 41/73  brain]
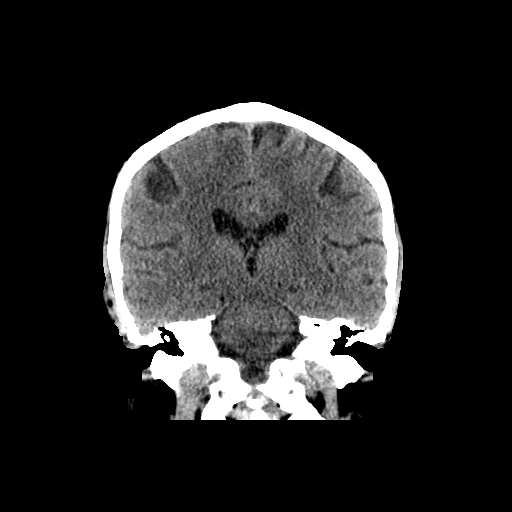

[Series 602: sagittal brain · sagittal · 0.49mm/px · 3 of 62 slices shown]
[im 21/62  brain]
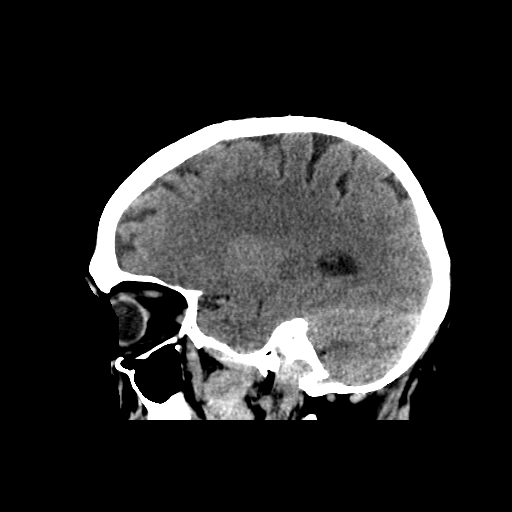
[im 31/62  brain]
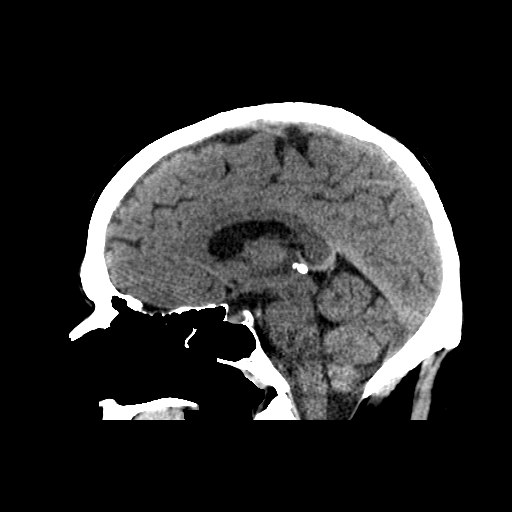
[im 41/62  brain]
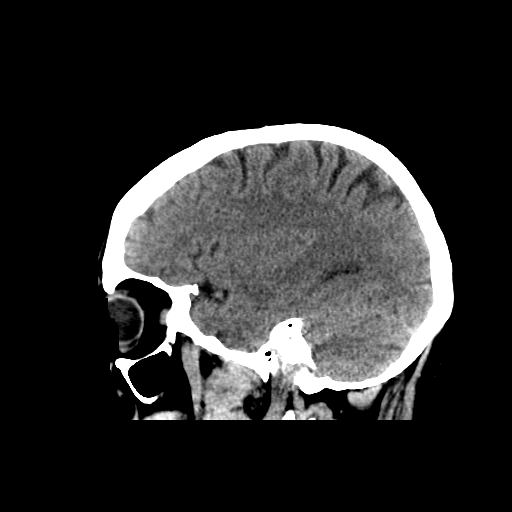

[16 of 47 positions shown; findings below may reference images not displayed]

FINDINGS: Brain: There is no evidence for acute hemorrhage, hydrocephalus,
mass lesion, or abnormal extra-axial fluid collection. No definite
CT evidence for acute infarction.

Vascular: No hyperdense vessel or unexpected calcification.

Skull: No evidence for fracture. No worrisome lytic or sclerotic
lesion.

Sinuses/Orbits: The visualized paranasal sinuses and mastoid air
cells are clear. Visualized portions of the globes and intraorbital
fat are unremarkable.

Other: None.
IMPRESSION: 1. Stable exam. Unremarkable noncontrast CT evaluation of the brain.

## 2018-03-16 NOTE — Telephone Encounter (Signed)
Called the patient and informed him that the order was sent to the company for him. Informed that insurance requires when starting a new machine that he would have to follow up 31-90 days of starting the new machine. This allows to assess for compliance and make sure the machine is working for him. Pt was appreciative for the call back and apologized for being upset over the insurance and how that all works. He appreciated [place] attempting to help.

## 2018-03-16 NOTE — Telephone Encounter (Signed)
Patient says he has given up on the insurance company and is now willing to rent to own the CPAP. Please send  order to [facility]. A returned call is not needed unless there are questions.

## 2018-04-06 ENCOUNTER — Other Ambulatory Visit: Payer: Self-pay | Admitting: Neurology

## 2018-04-06 DIAGNOSIS — G4733 Obstructive sleep apnea (adult) (pediatric): Secondary | ICD-10-CM

## 2018-04-06 DIAGNOSIS — Z9989 Dependence on other enabling machines and devices: Principal | ICD-10-CM

## 2018-04-06 NOTE — Telephone Encounter (Signed)
[facility] [phone] has called to inform that they will need an order sent to them that re: pt's CPAP the order needs to show as a replacement machine so it can reflect as a purchase on their end.

## 2018-04-06 NOTE — Telephone Encounter (Signed)
Pt has called to let RN Myriam Jacobson know that it was [facility] that has initiated this request for the new CPAP.  Pt is asking for a call so he can explain further to RN Myriam Jacobson

## 2018-04-06 NOTE — Telephone Encounter (Signed)
Called the patient back and he states that someone at [facility] had informed him to go through [facility] because of this being a replacement it should go through to where he comes out cheaper in the long run. I have printed everything. Contacted [facility] for their fax number and sent the orders and everything to them. Received confirmation of fax going through.

## 2018-05-04 ENCOUNTER — Ambulatory Visit: Payer: BLUE CROSS/BLUE SHIELD | Admitting: Neurology

## 2018-05-20 ENCOUNTER — Ambulatory Visit: Payer: Self-pay | Admitting: Adult Health

## 2018-06-07 ENCOUNTER — Telehealth: Payer: Self-pay

## 2018-06-07 ENCOUNTER — Encounter: Payer: Self-pay | Admitting: Gastroenterology

## 2018-06-07 ENCOUNTER — Ambulatory Visit (INDEPENDENT_AMBULATORY_CARE_PROVIDER_SITE_OTHER): Payer: Managed Care, Other (non HMO) | Admitting: Gastroenterology

## 2018-06-07 VITALS — BP 140/76 | HR 77 | Ht 70.0 in | Wt 215.4 lb

## 2018-06-07 DIAGNOSIS — Z86010 Personal history of colonic polyps: Secondary | ICD-10-CM | POA: Diagnosis not present

## 2018-06-07 DIAGNOSIS — Z7902 Long term (current) use of antithrombotics/antiplatelets: Secondary | ICD-10-CM

## 2018-06-07 MED ORDER — NA SULFATE-K SULFATE-MG SULF 17.5-3.13-1.6 GM/177ML PO SOLN: 1.0000 | Freq: Once | ORAL | 0 refills | Status: AC

## 2018-06-07 NOTE — Telephone Encounter (Signed)
Patient wants to have Colonoscopy on 06/13/18 and we need Plavix clearance for him to come off Plavix x 5 days prior to procedure. [facility] [phone]) to get approval. Edwena Blow, RN at [facility] states patient can hold Plavix 5 days prior to procedure on Monday 06/13/18.

## 2018-06-07 NOTE — Patient Instructions (Signed)

## 2018-06-07 NOTE — Progress Notes (Signed)
06/07/2018 William Mason [id number] 12-Apr-1961   HISTORY OF PRESENT ILLNESS:  This is a 57 year old male who is a patient of Dr. Lynne Leader.  His last colonoscopy was in March 2014 at which time he was found to have diverticulosis and 2 polyps that were removed and were tubular adenomas.  He does have a history of a larger tubulovillous adenoma that was removed previously so repeat colonoscopy was recommended at a 3-year interval.  He had some other medical issues that occurred near the time that he was due for colonoscopy so he has not yet had that repeat performed.  He is here today to discuss and schedule that study.  He is on Plavix daily for coronary artery disease with stents placed in October 2018.  He tells me that he has held his Plavix actually fairly recently for an epidural injection in his back without any issues and with approval from his cardiologist.  He follows with [facility] cardiology.  He denies any Gi complaints at this time including rectal bleeding, issues with moving his bowels, and abdominal pain.   Past Medical History:  Diagnosis Date  . Arthritis    "knees, shoulders, back" (04/27/2017)  . Chronic back pain    "5 ruptured discs in the middle; 1 ruptured disc in the lower" (04/27/2017)  . Depression   . Diverticulitis   . Exertional heat stroke ~ 01/2017  . Family history of adverse reaction to anesthesia    "mom had emergency appy at age 88; she had severe short term memory loss; never recovered"  . GERD (gastroesophageal reflux disease)   . History of hiatal hernia   . Hyperlipidemia   . OSA on CPAP    Past Surgical History:  Procedure Laterality Date  . CORONARY ANGIOPLASTY WITH STENT PLACEMENT  04/27/2017  . CORONARY STENT INTERVENTION N/A 04/27/2017   Procedure: CORONARY STENT INTERVENTION;  Surgeon: Nigel Mormon, MD;  Location: [facility];  Service: Cardiovascular;  Laterality: N/A;  . LEFT HEART CATH AND CORONARY ANGIOGRAPHY N/A 04/27/2017     Procedure: LEFT HEART CATH AND CORONARY ANGIOGRAPHY;  Surgeon: Nigel Mormon, MD;  Location: [facility];  Service: Cardiovascular;  Laterality: N/A;  . SHOULDER SURGERY Left ~ 1992   "tendon relocation; cut me open to do it"  . SURGERY SCROTAL / TESTICULAR Left ~ 1980   "cyst removed"  . TONSILLECTOMY      reports that he has never smoked. He quit smokeless tobacco use about 39 years ago.  His smokeless tobacco use included chew. He reports that he drinks alcohol. He reports that he does not use drugs. family history includes Breast cancer in his maternal grandmother; COPD in his father; Heart disease in his father; Leukemia in his paternal grandfather. Allergies  Allergen Reactions  . Vicodin [Hydrocodone-Acetaminophen] Nausea Only      Outpatient Encounter Medications as of 06/07/2018  Medication Sig  . aspirin 81 MG chewable tablet Chew 1 tablet (81 mg total) by mouth daily.  . cetirizine (ZYRTEC) 10 MG tablet Take 10 mg by mouth daily.   . clopidogrel (PLAVIX) 75 MG tablet Take 1 tablet (75 mg total) by mouth daily.  . diazepam (VALIUM) 5 MG tablet Take 5 mg by mouth 2 (two) times daily as needed for muscle spasms.   . DULoxetine (CYMBALTA) 30 MG capsule Take 30 mg by mouth 2 (two) times daily.   Marland Kitchen ezetimibe (ZETIA) 10 MG tablet Take 10 mg by  mouth daily.  . fluticasone (FLONASE) 50 MCG/ACT nasal spray Place 1 spray into both nostrils at bedtime.  . metaxalone (SKELAXIN) 800 MG tablet Take 800 mg by mouth daily as needed for muscle spasms.   . metoprolol succinate (TOPROL-XL) 25 MG 24 hr tablet Take 37.5 mg by mouth daily.   Marland Kitchen oxyCODONE-acetaminophen (PERCOCET/ROXICET) 5-325 MG tablet Take 1 tablet by mouth every 6 (six) hours as needed for severe pain.  . pantoprazole (PROTONIX) 40 MG tablet Take 1 tablet (40 mg total) by mouth daily.  Vladimir Faster Glycol-Propyl Glycol (SYSTANE ULTRA) 0.4-0.3 % SOLN Place 1-2 drops into both eyes daily as needed (eye irritation).  .  traMADol (ULTRAM) 50 MG tablet Take 50 mg by mouth every 6 (six) hours as needed for moderate pain.  . rosuvastatin (CRESTOR) 20 MG tablet Take 1 tablet (20 mg total) by mouth at bedtime.   No facility-administered encounter medications on file as of 06/07/2018.      REVIEW OF SYSTEMS  : All other systems reviewed and negative except where noted in the History of Present Illness.   PHYSICAL EXAM: BP 140/76   Pulse 77   Ht 5\' 10"  (1.778 m)   Wt 215 lb 6.4 oz (97.7 kg)   BMI 30.91 kg/m  General: Well developed white male in no acute distress Head: Normocephalic and atraumatic Eyes:  Sclerae anicteric, conjunctiva pink. Ears: Normal auditory acuity Lungs: Clear throughout to auscultation; no increased WOB. Heart: Irregular.  No M/R/G. Abdomen: Soft, non-distended.  BS present.  Non-tender. Rectal:  Will be done at the time of colonoscopy. Musculoskeletal: Symmetrical with no gross deformities  Skin: No lesions on visible extremities Extremities: No edema  Neurological: Alert oriented x 4, grossly non-focal Psychological:  Alert and cooperative. Normal mood and affect  ASSESSMENT AND PLAN: *Personal history of colon polyps:  Had 2 TA's removed in 09/3012 but also has a history of large TVA in the past so recall was still recommended at 3 year interval.  Will schedule with Dr. Fuller Plan. *Chronic antiplatelet use with Plavix due to history of CAD with stents placed in 04/2017:  Hold Plavix for 5 days before procedure - will instruct when and how to resume after procedure. Risks and benefits of procedure including bleeding, perforation, infection, missed lesions, medication reactions and possible hospitalization or surgery if complications occur explained. Additional rare but real risk of cardiovascular event such as heart attack or ischemia/infarct of other organs off of Plavix explained and need to seek urgent help if this occurs. Will communicate by phone or EMR with patient's prescribing  provider that to confirm holding Plavix is reasonable in this case.    CC:  Jani Gravel, MD

## 2018-06-07 NOTE — Progress Notes (Signed)
Reviewed and agree with management plan for colonoscopy off Plavix for 5 days.  Pricilla Riffle. Fuller Plan, MD [facility]

## 2018-06-13 ENCOUNTER — Ambulatory Visit (AMBULATORY_SURGERY_CENTER): Payer: Managed Care, Other (non HMO) | Admitting: Gastroenterology

## 2018-06-13 ENCOUNTER — Encounter: Payer: Self-pay | Admitting: Gastroenterology

## 2018-06-13 VITALS — BP 139/75 | HR 61 | Temp 98.7°F | Resp 16 | Ht 70.0 in | Wt 215.0 lb

## 2018-06-13 DIAGNOSIS — Z86010 Personal history of colonic polyps: Secondary | ICD-10-CM

## 2018-06-13 DIAGNOSIS — D124 Benign neoplasm of descending colon: Secondary | ICD-10-CM

## 2018-06-13 MED ORDER — SODIUM CHLORIDE 0.9 % IV SOLN: 500.0000 mL | Freq: Once | INTRAVENOUS | Status: DC

## 2018-06-13 NOTE — Progress Notes (Signed)
Called to room to assist during endoscopic procedure.  Patient ID and intended procedure confirmed with present staff. Received instructions for my participation in the procedure from the performing physician.  

## 2018-06-13 NOTE — Progress Notes (Signed)
PT taken to PACU. Monitors in place. VSS. Report given to RN. 

## 2018-06-13 NOTE — Progress Notes (Signed)
resently had one cortisone shot in rt shoulder; ultrasound guided shot in left shoulder Epidural injection in back about one week ago

## 2018-06-13 NOTE — Patient Instructions (Signed)
Handouts provided:  Polyps, Diverticulosis, and High Fiber Diet  Resume Plavix tomorrow 06/14/2018 at prior dose.    YOU HAD AN ENDOSCOPIC PROCEDURE TODAY AT [facility]:   Refer to the procedure report that was given to you for any specific questions about what was found during the examination.  If the procedure report does not answer your questions, please call your gastroenterologist to clarify.  If you requested that your care partner not be given the details of your procedure findings, then the procedure report has been included in a sealed envelope for you to review at your convenience later.  YOU SHOULD EXPECT: Some feelings of bloating in the abdomen. Passage of more gas than usual.  Walking can help get rid of the air that was put into your GI tract during the procedure and reduce the bloating. If you had a lower endoscopy (such as a colonoscopy or flexible sigmoidoscopy) you may notice spotting of blood in your stool or on the toilet paper. If you underwent a bowel prep for your procedure, you may not have a normal bowel movement for a few days.  Please Note:  You might notice some irritation and congestion in your nose or some drainage.  This is from the oxygen used during your procedure.  There is no need for concern and it should clear up in a day or so.  SYMPTOMS TO REPORT IMMEDIATELY:   Following lower endoscopy (colonoscopy or flexible sigmoidoscopy):  Excessive amounts of blood in the stool  Significant tenderness or worsening of abdominal pains  Swelling of the abdomen that is new, acute  Fever of 100F or higher  For urgent or emergent issues, a gastroenterologist can be reached at any hour by calling [phone].   DIET:  We do recommend a small meal at first, but then you may proceed to your regular diet.  Drink plenty of fluids but you should avoid alcoholic beverages for 24 hours.  ACTIVITY:  You should plan to take it easy for the rest of today and  you should NOT DRIVE or use heavy machinery until tomorrow (because of the sedation medicines used during the test).    FOLLOW UP: Our staff will call the number listed on your records the next business day following your procedure to check on you and address any questions or concerns that you may have regarding the information given to you following your procedure. If we do not reach you, we will leave a message.  However, if you are feeling well and you are not experiencing any problems, there is no need to return our call.  We will assume that you have returned to your regular daily activities without incident.  If any biopsies were taken you will be contacted by phone or by letter within the next 1-3 weeks.  Please call us at [phone] if you have not heard about the biopsies in 3 weeks.    SIGNATURES/CONFIDENTIALITY: You and/or your care partner have signed paperwork which will be entered into your electronic medical record.  These signatures attest to the fact that that the information above on your After Visit Summary has been reviewed and is understood.  Full responsibility of the confidentiality of this discharge information lies with you and/or your care-partner.

## 2018-06-13 NOTE — Op Note (Signed)
[facility] Patient Name: William Mason Procedure Date: 06/13/2018 8:02 AM MRN: [id number] Endoscopist: Ladene Artist , MD Age: 57 Referring MD:  Date of Birth: [birth year] Gender: Male Account #: [id number] Procedure:                Colonoscopy Indications:              Surveillance: Personal history of adenomatous                            polyps on last colonoscopy 5 years ago Medicines:                Monitored Anesthesia Care Procedure:                Pre-Anesthesia Assessment:                           - Prior to the procedure, a History and Physical                            was performed, and patient medications and                            allergies were reviewed. The patient's tolerance of                            previous anesthesia was also reviewed. The risks                            and benefits of the procedure and the sedation                            options and risks were discussed with the patient.                            All questions were answered, and informed consent                            was obtained. Prior Anticoagulants: The patient has                            taken Plavix (clopidogrel), last dose was 5 days                            prior to procedure. ASA Grade Assessment: III - A                            patient with severe systemic disease. After                            reviewing the risks and benefits, the patient was                            deemed in satisfactory condition to undergo the  procedure.                           After obtaining informed consent, the colonoscope                            was passed under direct vision. Throughout the                            procedure, the patient's blood pressure, pulse, and                            oxygen saturations were monitored continuously. The                            Model CF-HQ190L [id number]) scope was introduced                     through the anus and advanced to the the cecum,                            identified by appendiceal orifice and ileocecal                            valve. The ileocecal valve, appendiceal orifice,                            and rectum were photographed. The quality of the                            bowel preparation was good. The colonoscopy was                            performed without difficulty. The patient tolerated                            the procedure well. Scope In: 8:07:59 AM Scope Out: 8:19:58 AM Scope Withdrawal Time: 0 hours 9 minutes 44 seconds  Total Procedure Duration: 0 hours 11 minutes 59 seconds  Findings:                 The perianal and digital rectal examinations were                            normal.                           A 7 mm polyp was found in the descending colon. The                            polyp was sessile. The polyp was removed with a                            cold snare. Resection and retrieval were complete.  A few medium-mouthed diverticula were found in the                            left colon. There was no evidence of diverticular                            bleeding.                           Internal hemorrhoids were found during                            retroflexion. The hemorrhoids were Grade I                            (internal hemorrhoids that do not prolapse).                           The exam was otherwise without abnormality on                            direct and retroflexion views. Complications:            No immediate complications. Estimated blood loss:                            None. Estimated Blood Loss:     Estimated blood loss: none. Impression:               - One 7 mm polyp in the descending colon, removed                            with a cold snare. Resected and retrieved.                           - Mild diverticulosis in the left colon. There was                             no evidence of diverticular bleeding.                           - Internal hemorrhoids.                           - The examination was otherwise normal on direct                            and retroflexion views. Recommendation:           - Repeat colonoscopy in 5 years for surveillance.                           - Resume Plavix (clopidogrel) tomorrow at prior                            dose. Refer to managing physician for further  adjustment of therapy.                           - Patient has a contact number available for                            emergencies. The signs and symptoms of potential                            delayed complications were discussed with the                            patient. Return to normal activities tomorrow.                            Written discharge instructions were provided to the                            patient.                           - High fiber diet.                           - Continue present medications.                           - Await pathology results. Ladene Artist, MD 06/13/2018 8:22:53 AM This report has been signed electronically.

## 2018-06-14 ENCOUNTER — Telehealth: Payer: Self-pay | Admitting: *Deleted

## 2018-06-14 NOTE — Telephone Encounter (Signed)
  Follow up Call-  Call back number 06/13/2018  Post procedure Call Back phone  # [phone]  Permission to leave phone message Yes  Some recent data might be hidden     Patient questions:  Do you have a fever, pain , or abdominal swelling? No. Pain Score  0 *  Have you tolerated food without any problems? Yes.    Have you been able to return to your normal activities? Yes.    Do you have any questions about your discharge instructions: Diet   No. Medications  No. Follow up visit  No.  Do you have questions or concerns about your Care? No.  Actions: * If pain score is 4 or above: No action needed, pain <4.  **Patient complained about having vivid dreams last night and urinated on himself 3 separate times.  I explained to patient it could be lingering after effect of propofol and to see how he does tonight.  He agrees with the plan and no further questions.

## 2018-06-24 ENCOUNTER — Encounter: Payer: Self-pay | Admitting: Gastroenterology

## 2018-08-04 HISTORY — PX: SHOULDER SURGERY: SHX246

## 2018-08-17 ENCOUNTER — Ambulatory Visit (INDEPENDENT_AMBULATORY_CARE_PROVIDER_SITE_OTHER): Payer: Managed Care, Other (non HMO) | Admitting: Neurology

## 2018-08-17 ENCOUNTER — Encounter: Payer: Self-pay | Admitting: Neurology

## 2018-08-17 VITALS — BP 126/77 | HR 77 | Ht 71.0 in | Wt 215.0 lb

## 2018-08-17 DIAGNOSIS — R9439 Abnormal result of other cardiovascular function study: Secondary | ICD-10-CM | POA: Diagnosis not present

## 2018-08-17 DIAGNOSIS — G4733 Obstructive sleep apnea (adult) (pediatric): Secondary | ICD-10-CM

## 2018-08-17 DIAGNOSIS — Z9989 Dependence on other enabling machines and devices: Secondary | ICD-10-CM

## 2018-08-17 DIAGNOSIS — G4719 Other hypersomnia: Secondary | ICD-10-CM

## 2018-08-17 DIAGNOSIS — F1190 Opioid use, unspecified, uncomplicated: Secondary | ICD-10-CM

## 2018-08-17 DIAGNOSIS — I25118 Atherosclerotic heart disease of native coronary artery with other forms of angina pectoris: Secondary | ICD-10-CM | POA: Diagnosis not present

## 2018-08-17 DIAGNOSIS — G4737 Central sleep apnea in conditions classified elsewhere: Secondary | ICD-10-CM

## 2018-08-17 DIAGNOSIS — Z79899 Other long term (current) drug therapy: Secondary | ICD-10-CM

## 2018-08-17 NOTE — Progress Notes (Signed)
SLEEP MEDICINE CLINIC   Provider:  Larey Seat, MD   Primary Care Physician:  Jani Gravel, MD   Referring Provider: Jani Gravel, MD   HPI:  William Mason is a 58 y.o. male patient, now here for a revisit- 08-17-2018, he is presenting today with a sling around his arm, he is using this to support the right elbow.  He had shoulder surgery on January 16 of this year.  He continues to use his CPAP very compliantly 30 out of 30 days past but with the onset of the shoulder surgery he was up more often at night and has not been using it 4 hours consecutive every night.  However downloads from November and October confirmed that he was 97% compliant at that time.   He uses an AutoSet between 5 and 12 cmH2O with 3 cm EPR, his residual AHI is 4.2 and his average user time for this last month was 5 hours 8 minutes.  He has not slept well since his surgery due to post surgical pain-in the past 2 weeks since surgery he often has only slept between 2 and 4 hours and that is his fragmentation.  Fatigue: Is at 27 points, Epworth sleepiness score endorsed at 11 points again this is an unusual situation and may not reflect his normal sleepiness. He fel asleep in my waiting room.      02-10-2018, he is reporting excessive daytime sleepiness while using his old CPAP compliantly- We no longer have the software- last download was 5 years ago, according to patient., [facility].   William Mason was seen October 2018  here as in a referral  from Dr. Maudie Mercury for a sleep apnea re- evaluation.  [name] was last seen in October 2018 when he underwent a HST sleep study to confirm that he still had apnea and of what kind.  The sleep study returned positive AHI of 13.2/h and a CPAP order had been issued, but the patient learned that also he had met his out-of-pocket cost and deductible the insurance company did not want to cover a new CPAP.  They actually asked the patient to return to the machine for 1 year which is  these days a very common request, but would have added cost to the patient. He is now in need of a new CPSAP, uses compliantly his old machine. It's not downloadable. He is more fatigued.  Epworth 16/ 24. Had recenlty a heat stroke. See last years HST below. CD    NAME:  William Mason                                                             DOB: 08/21/1960 MEDICAL RECORD NUMBER [id number]                                         DOS: 05/19/2017 REFERRING PHYSICIAN: Jani Gravel, M.D.  STUDY PERFORMED: Home Sleep Study HISTORY:   58 year old male patient of Dr. Julianne Rice, who tested positive for OSA 2011.  He was placed on CPAP at that time. Pt has not been followed up with since.  Pt is in need of new machine and equipment. BMI  31.2. Epworth Sleepiness Score endorsed at 15/24 points, FSS 20 points,         STUDY RESULTS: Total Recording Time:  8 hours, 37 minutes Total Apnea/Hypopnea Index (AHI):   13.2 /hour and RDI was 15.6 /hr. Average Oxygen Saturation SpO2 at 91%; Lowest Oxygen Saturation 80 %  Total Time in Oxygen Saturation Below 89%: 66 minutes= 13 % Average Heart Rate: 71 bpm (60 - 113 bpm)  IMPRESSION: Mild and mainly obstructive sleep apnea with prolonged desaturation time- this constellation warrants CPAP therapy.  RECOMMENDATION: Auto-titrating CPAP with a window of 5-12 cm water pressure, with mask of choice, heated humidity. I certify that I have reviewed the raw data recording prior to the issuance of this report in accordance with the standards of the American Academy of Sleep Medicine (AASM). Larey Seat, M.D.     05-21-2017    William Mason is a 58 year old right-handed Caucasian patient of Dr. Maudie Mercury. He remembers a sleep study taking place about 7 years ago at [facility] on [address] in the basement of Dr. Julianne Rice office. He was then placed on CPAP and has used it ever since. He still uses his first machine with the same settings that were prescribed in 2011. In the meantime he  has sometimes lost weight and sometimes gained weight, he may have been as heavy as 220 pounds at the time of his sleep study. He also remembers that changes in his body mass index seemed to not correlate with changes in his apnea frequency or his need for CPAP. Dr. Maudie Mercury initiated a consultation with Dr. Vinetta Bergamo, an otolaryngologist at [place]. Dr. Vinetta Bergamo was fascinated with the epiglottal anatomy of the patient. However no specific treatment could be initiated or resulted. Dr. Vinetta Bergamo discouraged at the time a UPPP.  William Mason is here today because he hasn't had follow-up for his CPAP needs, does not know if he has a certain degree of apnea or if he is perhaps even apnea free.  He reportedly has used his CPAP compliantly ever since it was issued and when he doesn't use CPAP (during an unscheduled nap for example), he feels that his sleep is not is refreshing and restorative.  William Mason is most recent encounter with Dr. Maudie Mercury was related to a syncope. It is presumed that he had a heat stroke. Extensive laboratory testing showed elevated transaminases, elevated monocytes, elevated hematocrit. I do not think that he was dehydrated as his BUN was 14.2. Glucose was normal. There was no evidence of a silent myocardial infarction a CT of the head without contrast was negative, carotid duplex was normal, cardiac stress test by treadmill was abnormal. His  echocardiograms is pending .  Sleep habits are as follows: Last hour before bedtime : feeds the cats, and watches TV in the living room, goes to bed at about 11 Pm, his wife is already in bed by 9 PM.The patient has at least one bathroom break usually in the early morning hours( 3-4 AM) and can go back to sleep after that. He is using CPAP every night. He averages at least 7 hours of nocturnal sleep. He does not report any sleep choking, palpitations, diaphoresis at night. Over the last year he has developed very vivid sometimes bizarre dreams, lucid  dreams.. These include encounters with now deceased family members etc.and  he has not enacted dreams. He gets up at 6.30.   Sleep medical history and family sleep history: single child, both parents snored. Mother has  dementia. Tonsillectomy at age 31-5 , and nasal septoplasty at age 77 and broke nose again in high school .  Social history: he works outdoors, Biomedical scientist. married with 5 cats. Non smoker - non drinker, caffeine : 1 cup a day ( coffee) no iced tea, nor soda.   Review of Systems: Out of a complete 14 system review, the patient complains of only the following symptoms, and all other reviewed systems are negative.  How likely are you to doze in the following situations: 0 = not likely, 1 = slight chance, 2 = moderate chance, 3 = high chance  Sitting and Reading? 3 Watching Television? 3 Sitting inactive in a public place (theater or meeting)?1 As a passenger in a car for an hour without a break?1  Lying down in the afternoon when circumstances permit?2 Sitting and talking to someone?0 Sitting quietly after lunch without alcohol? 1 In a car, while stopped for a few minutes in traffic?0   Total = Epworth score down to 11 from pre CPAP 16/24 , Fatigue severity score 27- up from  20 ( post surgical pain, sleep deprivation)  , depression score 1/15 .  Nocturia - once nightly.    Social History   Socioeconomic History  . Marital status: Married    Spouse name: Not on file  . Number of children: Not on file  . Years of education: Not on file  . Highest education level: Not on file  Occupational History  . Not on file  Social Needs  . Financial resource strain: Not on file  . Food insecurity:    Worry: Not on file    Inability: Not on file  . Transportation needs:    Medical: Not on file    Non-medical: Not on file  Tobacco Use  . Smoking status: Never Smoker  . Smokeless tobacco: Former Systems developer    Types: Chew  Substance and Sexual Activity  . Alcohol use: Yes     Comment: 04/27/2017 "1-2 drinks/year; if that"  . Drug use: No  . Sexual activity: Not Currently  Lifestyle  . Physical activity:    Days per week: Not on file    Minutes per session: Not on file  . Stress: Not on file  Relationships  . Social connections:    Talks on phone: Not on file    Gets together: Not on file    Attends religious service: Not on file    Active member of club or organization: Not on file    Attends meetings of clubs or organizations: Not on file    Relationship status: Not on file  . Intimate partner violence:    Fear of current or ex partner: Not on file    Emotionally abused: Not on file    Physically abused: Not on file    Forced sexual activity: Not on file  Other Topics Concern  . Not on file  Social History Narrative  . Not on file    Family History  Problem Relation Age of Onset  . Breast cancer Maternal Grandmother   . Leukemia Paternal Grandfather   . Heart disease Father   . COPD Father   . Colon cancer Neg Hx   . Esophageal cancer Neg Hx   . Rectal cancer Neg Hx   . Stomach cancer Neg Hx     Past Medical History:  Diagnosis Date  . Allergy   . Arthritis    "knees, shoulders, back" (04/27/2017)  . Chronic back pain    "  5 ruptured discs in the middle; 1 ruptured disc in the lower" (04/27/2017)  . Depression   . Diverticulitis   . Exertional heat stroke ~ 01/2017  . Family history of adverse reaction to anesthesia    "mom had emergency appy at age 56; she had severe short term memory loss; never recovered"  . GERD (gastroesophageal reflux disease)   . Glaucoma   . History of hiatal hernia   . Hyperlipidemia   . OSA on CPAP   . PVC (premature ventricular contraction)   . Sleep apnea     Past Surgical History:  Procedure Laterality Date  . CORONARY ANGIOPLASTY WITH STENT PLACEMENT  04/27/2017  . CORONARY STENT INTERVENTION N/A 04/27/2017   Procedure: CORONARY STENT INTERVENTION;  Surgeon: Nigel Mormon, MD;  Location: [facility];  Service: Cardiovascular;  Laterality: N/A;  . LEFT HEART CATH AND CORONARY ANGIOGRAPHY N/A 04/27/2017   Procedure: LEFT HEART CATH AND CORONARY ANGIOGRAPHY;  Surgeon: Nigel Mormon, MD;  Location: [facility];  Service: Cardiovascular;  Laterality: N/A;  . SHOULDER SURGERY Left ~ 1992   "tendon relocation; cut me open to do it"  . SHOULDER SURGERY Right 08/04/2018  . SURGERY SCROTAL / TESTICULAR Left ~ 1980   "cyst removed"  . TONSILLECTOMY      Current Outpatient Medications  Medication Sig Dispense Refill  . aspirin 81 MG chewable tablet Chew 1 tablet (81 mg total) by mouth daily. 90 tablet 6  . cetirizine (ZYRTEC) 10 MG tablet Take 10 mg by mouth daily.     . diazepam (VALIUM) 5 MG tablet Take 5 mg by mouth 2 (two) times daily as needed for muscle spasms.     . DULoxetine (CYMBALTA) 30 MG capsule Take 30 mg by mouth 2 (two) times daily.     Marland Kitchen ezetimibe (ZETIA) 10 MG tablet Take 10 mg by mouth daily.    . fluticasone (FLONASE) 50 MCG/ACT nasal spray Place 1 spray into both nostrils at bedtime.    Marland Kitchen HYDROmorphone (DILAUDID) 2 MG tablet     . metaxalone (SKELAXIN) 800 MG tablet Take 800 mg by mouth daily as needed for muscle spasms.     . methocarbamol (ROBAXIN) 500 MG tablet     . metoprolol succinate (TOPROL-XL) 25 MG 24 hr tablet Take 37.5 mg by mouth daily.     Marland Kitchen oxyCODONE-acetaminophen (PERCOCET/ROXICET) 5-325 MG tablet Take 1 tablet by mouth every 6 (six) hours as needed for severe pain.    . pantoprazole (PROTONIX) 40 MG tablet Take 1 tablet (40 mg total) by mouth daily. 30 tablet 6  . Polyethyl Glycol-Propyl Glycol (SYSTANE ULTRA) 0.4-0.3 % SOLN Place 1-2 drops into both eyes daily as needed (eye irritation).    . traMADol (ULTRAM) 50 MG tablet Take 50 mg by mouth every 6 (six) hours as needed for moderate pain.    . rosuvastatin (CRESTOR) 20 MG tablet Take 1 tablet (20 mg total) by mouth at bedtime. 30 tablet 11   No current facility-administered  medications for this visit.     Allergies as of 08/17/2018 - Review Complete 08/17/2018  Allergen Reaction Noted  . Vicodin [hydrocodone-acetaminophen] Nausea Only 10/26/2011    Vitals: Last Weight:  Wt Readings from Last 1 Encounters:  08/17/18 215 lb (97.5 kg)   BMI:    Last Height:   Ht Readings from Last 1 Encounters:  08/17/18 '5\' 11"'$  (1.803 m)    Physical exam:  General: The patient is awake,  alert and appears not in acute distress. The patient is well groomed. Head: Normocephalic, atraumatic. Neck is supple. Mallampati , 1- uvula lifts completely  neck circumference: 17. Nasal airflow patent , TMJ click is not  evident . Retrognathia is seen.  Used to wear braces top only.  Cardiovascular:  Regular rate and rhythm , without  murmurs or carotid bruit, and without distended neck veins. Respiratory: Lungs are clear to auscultation. Skin:  Without evidence of edema, or rash Trunk: BMI is 31.66 The patient's posture is erect   Neurologic exam: Attention span & concentration ability appears normal. Speech is fluent,  without  dysarthria, dysphonia or aphasia. Mood and affect are appropriate.  Cranial nerves:  Facial sensation intact to fine touch. Facial motor strength is symmetric and tongue and uvula move midline. Shoulder shrug was symmetrical.  Motor exam:  Normal tone, muscle bulk and symmetric strength in all extremities. He has shoulder pain and crepitus in both shoulders, now in a sling. Sensory:  Fine touch, pinprick and vibration were intact  in all extremities. Proprioception deferred. Numbness between big toe and neighbor on the left foot. S1?  Coordination:  Deferred.  Gait and station: Patient walks without assistive device - Strength within normal limits.Stance is stable and normal.     Assessment:  After physical and neurologic examination, review of laboratory studies,  Personal review of imaging studies, reports of other /same  Imaging studies, results of  polysomnography and / or neurophysiology testing and pre-existing records as far as provided in visit., my assessment is:   1) since I have no access to the original sleep study and cannot interrogate a 60 year old ResMed CPAP anymore - software is no longer available. I only option is to repeat a sleep study see at what level of apnea the patient currently is affected if he still has apnea, and then work with an auto titrate him - he is covered by [employer].   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 His OSA needs to be treated to prevent heart disease progression.   The patient was advised of the nature of the diagnosed disorder , the treatment options and the  risks for general health and wellness arising from not treating the condition.   I spent more than 25 minutes of face to face time with the patient. Greater than 50% of time was spent in counseling and coordination of care. We have discussed the diagnosis and differential and I answered the patient's questions.    He works well with new CPAP autotitration machine- order was based on HST from 2018 !! No repeat study was performed.   Rv with NP in 12 month.     Larey Seat, MD 01/23/8674, 44:92 AM  Certified in Neurology by ABPN Certified in [place] by [facility] [address] [place], [place] [address]

## 2018-10-06 ENCOUNTER — Other Ambulatory Visit: Payer: Self-pay

## 2018-10-06 DIAGNOSIS — R9439 Abnormal result of other cardiovascular function study: Secondary | ICD-10-CM

## 2018-10-06 MED ORDER — METOPROLOL SUCCINATE ER 25 MG PO TB24: 37.5000 mg | ORAL_TABLET | Freq: Every day | ORAL | 3 refills | Status: DC

## 2018-11-04 ENCOUNTER — Other Ambulatory Visit: Payer: Self-pay | Admitting: Cardiology

## 2018-11-04 DIAGNOSIS — I6522 Occlusion and stenosis of left carotid artery: Secondary | ICD-10-CM

## 2018-12-09 ENCOUNTER — Telehealth: Payer: Self-pay

## 2018-12-09 NOTE — Telephone Encounter (Signed)
Okay to resume oxaprozin (NSAID) and aciphex (PPI)  Thanks MJP

## 2018-12-09 NOTE — Telephone Encounter (Signed)
Pt called and wanted to know if he can resume taking Oxaprozin & aciphex, since being off plavix since first of the year.

## 2018-12-13 ENCOUNTER — Other Ambulatory Visit: Payer: Self-pay

## 2018-12-13 MED ORDER — ASPIRIN 81 MG PO CHEW: 81.0000 mg | CHEWABLE_TABLET | Freq: Every day | ORAL | 6 refills | Status: DC

## 2019-01-26 ENCOUNTER — Other Ambulatory Visit: Payer: Self-pay

## 2019-01-26 ENCOUNTER — Ambulatory Visit (INDEPENDENT_AMBULATORY_CARE_PROVIDER_SITE_OTHER): Payer: Managed Care, Other (non HMO)

## 2019-01-26 DIAGNOSIS — I6522 Occlusion and stenosis of left carotid artery: Secondary | ICD-10-CM

## 2019-01-29 ENCOUNTER — Other Ambulatory Visit: Payer: Self-pay | Admitting: Cardiology

## 2019-01-29 DIAGNOSIS — I6522 Occlusion and stenosis of left carotid artery: Secondary | ICD-10-CM

## 2019-02-01 ENCOUNTER — Ambulatory Visit (INDEPENDENT_AMBULATORY_CARE_PROVIDER_SITE_OTHER): Payer: Managed Care, Other (non HMO) | Admitting: Cardiology

## 2019-02-01 ENCOUNTER — Other Ambulatory Visit: Payer: Self-pay

## 2019-02-01 ENCOUNTER — Encounter: Payer: Self-pay | Admitting: Cardiology

## 2019-02-01 DIAGNOSIS — I10 Essential (primary) hypertension: Secondary | ICD-10-CM

## 2019-02-01 DIAGNOSIS — I6522 Occlusion and stenosis of left carotid artery: Secondary | ICD-10-CM

## 2019-02-01 DIAGNOSIS — E785 Hyperlipidemia, unspecified: Secondary | ICD-10-CM

## 2019-02-01 DIAGNOSIS — E782 Mixed hyperlipidemia: Secondary | ICD-10-CM

## 2019-02-01 HISTORY — DX: Hyperlipidemia, unspecified: E78.5

## 2019-02-01 NOTE — Progress Notes (Signed)
Virtual Visit via Video Note   Subjective:   William Mason, male    DOB: Jan 27, 1961, 58 y.o.   MRN: [id number]   I connected with the patient on 02/01/2019 by a telephone call and verified that I am speaking with the correct person using two identifiers.     I offered the patient a video enabled application for a virtual visit. Unfortunately, this could not be accomplished due to technical difficulties/lack of video enabled phone/computer. I discussed the limitations of evaluation and management by telemedicine and the availability of in person appointments. The patient expressed understanding and agreed to proceed.   This visit type was conducted due to national recommendations for restrictions regarding the COVID-19 Pandemic (e.g. social distancing).  This format is felt to be most appropriate for this patient at this time.  All issues noted in this document were discussed and addressed.  No physical exam was performed (except for noted visual exam findings with Tele health visits).  The patient has consented to conduct a Tele health visit and understands insurance will be billed.    Chief complaint:  Coronary artery disease   HPI  58 y/o Caucasian male with CAD s/p LCx/OM PCI 04/2017, mod asympytomatic lt carotid stenosis, hypertension, hyperlipidemia.  Patient has been doing well. He underwent shoulder surgery few weeks ago without any cardiac complications. He had one episode of chest discomfort while at rest a few weeks ago, that resolved with aspirin. He has not had any chest pain with exertion.    Past Medical History:  Diagnosis Date  . Allergy   . Arthritis    "knees, shoulders, back" (04/27/2017)  . Chronic back pain    "5 ruptured discs in the middle; 1 ruptured disc in the lower" (04/27/2017)  . Depression   . Diverticulitis   . Exertional heat stroke ~ 01/2017  . Family history of adverse reaction to anesthesia    "mom had emergency appy at age 83; she had severe short  term memory loss; never recovered"  . GERD (gastroesophageal reflux disease)   . Glaucoma   . History of hiatal hernia   . HLD (hyperlipidemia) 02/01/2019  . Hyperlipidemia   . OSA on CPAP   . PVC (premature ventricular contraction)   . Sleep apnea      Past Surgical History:  Procedure Laterality Date  . CORONARY ANGIOPLASTY WITH STENT PLACEMENT  04/27/2017  . CORONARY STENT INTERVENTION N/A 04/27/2017   Procedure: CORONARY STENT INTERVENTION;  Surgeon: Nigel Mormon, MD;  Location: [place] [facility];  Service: Cardiovascular;  Laterality: N/A;  . LEFT HEART CATH AND CORONARY ANGIOGRAPHY N/A 04/27/2017   Procedure: LEFT HEART CATH AND CORONARY ANGIOGRAPHY;  Surgeon: Nigel Mormon, MD;  Location: [place] [facility];  Service: Cardiovascular;  Laterality: N/A;  . SHOULDER SURGERY Left ~ 1992   "tendon relocation; cut me open to do it"  . SHOULDER SURGERY Right 08/04/2018  . SURGERY SCROTAL / TESTICULAR Left ~ 1980   "cyst removed"  . TONSILLECTOMY       Social History   Socioeconomic History  . Marital status: Married    Spouse name: Not on file  . Number of children: Not on file  . Years of education: Not on file  . Highest education level: Not on file  Occupational History  . Not on file  Social Needs  . Financial resource strain: Not on file  . Food insecurity    Worry: Not on file  Inability: Not on file  . Transportation needs    Medical: Not on file    Non-medical: Not on file  Tobacco Use  . Smoking status: Never Smoker  . Smokeless tobacco: Former Systems developer    Types: Chew  Substance and Sexual Activity  . Alcohol use: Yes    Comment: 04/27/2017 "1-2 drinks/year; if that"  . Drug use: No  . Sexual activity: Not Currently  Lifestyle  . Physical activity    Days per week: Not on file    Minutes per session: Not on file  . Stress: Not on file  Relationships  . Social Herbalist on phone: Not on file    Gets together: Not on file     Attends religious service: Not on file    Active member of club or organization: Not on file    Attends meetings of clubs or organizations: Not on file    Relationship status: Not on file  . Intimate partner violence    Fear of current or ex partner: Not on file    Emotionally abused: Not on file    Physically abused: Not on file    Forced sexual activity: Not on file  Other Topics Concern  . Not on file  Social History Narrative  . Not on file     Family History  Problem Relation Age of Onset  . Breast cancer Maternal Grandmother   . Leukemia Paternal Grandfather   . Heart disease Father   . COPD Father   . Colon cancer Neg Hx   . Esophageal cancer Neg Hx   . Rectal cancer Neg Hx   . Stomach cancer Neg Hx      Current Outpatient Medications on File Prior to Visit  Medication Sig Dispense Refill  . aspirin 81 MG chewable tablet Chew 1 tablet (81 mg total) by mouth daily. 90 tablet 6  . cetirizine (ZYRTEC) 10 MG tablet Take 10 mg by mouth daily.     . diazepam (VALIUM) 5 MG tablet Take 5 mg by mouth 2 (two) times daily as needed for muscle spasms.     . DULoxetine (CYMBALTA) 30 MG capsule Take 30 mg by mouth 2 (two) times daily.     Marland Kitchen ezetimibe (ZETIA) 10 MG tablet Take 10 mg by mouth daily.    . fluticasone (FLONASE) 50 MCG/ACT nasal spray Place 1 spray into both nostrils at bedtime.    Marland Kitchen HYDROmorphone (DILAUDID) 2 MG tablet     . metaxalone (SKELAXIN) 800 MG tablet Take 800 mg by mouth daily as needed for muscle spasms.     . methocarbamol (ROBAXIN) 500 MG tablet     . metoprolol succinate (TOPROL-XL) 25 MG 24 hr tablet Take 1.5 tablets (37.5 mg total) by mouth daily. 90 tablet 3  . oxyCODONE-acetaminophen (PERCOCET/ROXICET) 5-325 MG tablet Take 1 tablet by mouth every 6 (six) hours as needed for severe pain.    . pantoprazole (PROTONIX) 40 MG tablet Take 1 tablet (40 mg total) by mouth daily. 30 tablet 6  . Polyethyl Glycol-Propyl Glycol (SYSTANE ULTRA) 0.4-0.3 % SOLN  Place 1-2 drops into both eyes daily as needed (eye irritation).    . rosuvastatin (CRESTOR) 20 MG tablet Take 1 tablet (20 mg total) by mouth at bedtime. 30 tablet 11  . traMADol (ULTRAM) 50 MG tablet Take 50 mg by mouth every 6 (six) hours as needed for moderate pain.     No current facility-administered medications on file prior  to visit.     Cardiovascular studies:  Carotid artery duplex  02/19/19: Minimal plaque in the right internal carotid artery (minimal). Stenosis in the left internal carotid artery (50-69%) with moderate diffuse heterogeneous plaque. Antegrade right vertebral artery flow. Antegrade left vertebral artery flow. No significant change from 07/25/2018. Follow up in six months is appropriate if clinically indicated.  Coronary angiography 04/27/2017: Single vessel severe coronary arter disease OM1 tandem 90% & 60% stenoses with resting Pd/Pa 0.79. Successful percutaneous coronary intervention     PTCA and overlapping stents placement OM1 Synergy DES 3.5 X 12 mm & 3.0 X 12 mm distally  Echocardiogram 04/01/2017: Left ventricle cavity is normal in size. Normal global wall motion. Normal diastolic filling pattern. Calculated EF 67%. Left atrial cavity is normal in size. 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 No evidence of pulmonary hypertension.   Recent labs: 01/30/2019: Glucose 107. BUN/Cr 13/1.03. eGFR 79. Na/K 143/4.5. Rest of the CMP normal.  Chol 141, TG 172, HDL 43, LDL 64.   Review of Systems  Constitution: Negative for decreased appetite, malaise/fatigue, weight gain and weight loss.  HENT: Negative for congestion.   Eyes: Negative for visual disturbance.  Cardiovascular: Negative for chest pain, dyspnea on exertion, leg swelling, palpitations and syncope.  Respiratory: Negative for cough.   Endocrine: Negative for cold intolerance.  Hematologic/Lymphatic: Does not bruise/bleed easily.  Skin:  Negative for itching and rash.  Musculoskeletal: Negative for myalgias.  Gastrointestinal: Negative for abdominal pain, nausea and vomiting.  Genitourinary: Negative for dysuria.  Neurological: Negative for dizziness and weakness.  Psychiatric/Behavioral: The patient is not nervous/anxious.   All other systems reviewed and are negative.   Vitals not available.   Objective:    Physical Exam Telephone visit. No exam performed.       Assessment & Recommendations:   58 y/o Caucasian male with CAD s/p LCx/OM PCI 04/2017, mod asympytomatic lt carotid stenosis, hypertension, hyperlipidemia.  CAD: Stable. No angina symptoms.  Continue aspirin, crestor + zetia, metoprolol succinate.  Hyperlipidemia: Significant improvement on crestor + zetia.  Mod lt ICA stenosis: Asymptomatic. Continue aggressive medical management. Repeat carotid duplex in 12 months.  F/u in 3 months and 12 months.  Nigel Mormon, MD [facility] Pager: [phone] Office: [phone] If no answer Cell [phone]

## 2019-02-03 ENCOUNTER — Encounter: Payer: Self-pay | Admitting: Cardiology

## 2019-02-14 NOTE — Telephone Encounter (Signed)
Read

## 2019-02-14 NOTE — Telephone Encounter (Signed)
Please read

## 2019-02-21 ENCOUNTER — Other Ambulatory Visit: Payer: Self-pay

## 2019-02-21 DIAGNOSIS — I25118 Atherosclerotic heart disease of native coronary artery with other forms of angina pectoris: Secondary | ICD-10-CM

## 2019-02-21 MED ORDER — NITROGLYCERIN 0.4 MG SL SUBL: 0.4000 mg | SUBLINGUAL_TABLET | SUBLINGUAL | 3 refills | Status: DC | PRN

## 2019-03-08 ENCOUNTER — Ambulatory Visit: Payer: Managed Care, Other (non HMO) | Admitting: Adult Health

## 2019-05-04 ENCOUNTER — Encounter: Payer: Self-pay | Admitting: Cardiology

## 2019-05-04 ENCOUNTER — Ambulatory Visit (INDEPENDENT_AMBULATORY_CARE_PROVIDER_SITE_OTHER): Payer: Managed Care, Other (non HMO) | Admitting: Cardiology

## 2019-05-04 ENCOUNTER — Other Ambulatory Visit: Payer: Self-pay

## 2019-05-04 VITALS — BP 119/76 | HR 70 | Temp 97.7°F | Ht 71.0 in | Wt 219.0 lb

## 2019-05-04 DIAGNOSIS — I2510 Atherosclerotic heart disease of native coronary artery without angina pectoris: Secondary | ICD-10-CM | POA: Diagnosis not present

## 2019-05-04 DIAGNOSIS — I6522 Occlusion and stenosis of left carotid artery: Secondary | ICD-10-CM | POA: Diagnosis not present

## 2019-05-04 DIAGNOSIS — I10 Essential (primary) hypertension: Secondary | ICD-10-CM

## 2019-05-04 DIAGNOSIS — E782 Mixed hyperlipidemia: Secondary | ICD-10-CM

## 2019-05-04 NOTE — Progress Notes (Signed)
Subjective:   William Mason, male    DOB: [birth year], 58 y.o.   MRN: [id number]    Chief complaint:  Coronary artery disease   HPI  58 y/o Caucasian male with CAD s/p LCx/OM PCI 04/2017, mod asympytomatic lt carotid stenosis, hypertension, hyperlipidemia.  Patient has been doing well without chest pain, shortness of breath, palpitations, leg edema, orthopnea, PND, TIA/syncope. He has not lost any weight. His lipids have improved. He is looking forward to get surgery on his rotator cuff injuries on both shoulders, as well as another epidural injection for his spine.   Unfortunately, he lost his mother recently.     He underwent shoulder surgery few weeks ago without any cardiac complications. He had one episode of chest discomfort while at rest a few weeks ago, that resolved with aspirin. He has not had any chest pain with exertion.    Past Medical History:  Diagnosis Date  . Allergy   . Arthritis    "knees, shoulders, back" (04/27/2017)  . Chronic back pain    "5 ruptured discs in the middle; 1 ruptured disc in the lower" (04/27/2017)  . Depression   . Diverticulitis   . Exertional heat stroke ~ 01/2017  . Family history of adverse reaction to anesthesia    "mom had emergency appy at age 64; she had severe short term memory loss; never recovered"  . GERD (gastroesophageal reflux disease)   . Glaucoma   . History of hiatal hernia   . HLD (hyperlipidemia) 02/01/2019  . Hyperlipidemia   . OSA on CPAP   . PVC (premature ventricular contraction)   . Sleep apnea      Past Surgical History:  Procedure Laterality Date  . CORONARY ANGIOPLASTY WITH STENT PLACEMENT  04/27/2017  . CORONARY STENT INTERVENTION N/A 04/27/2017   Procedure: CORONARY STENT INTERVENTION;  Surgeon: Nigel Mormon, MD;  Location: [place] [facility];  Service: Cardiovascular;  Laterality: N/A;  . LEFT HEART CATH AND CORONARY ANGIOGRAPHY N/A 04/27/2017   Procedure: LEFT HEART CATH AND CORONARY  ANGIOGRAPHY;  Surgeon: Nigel Mormon, MD;  Location: [place] [facility];  Service: Cardiovascular;  Laterality: N/A;  . SHOULDER SURGERY Left ~ 1992   "tendon relocation; cut me open to do it"  . SHOULDER SURGERY Right 08/04/2018  . SURGERY SCROTAL / TESTICULAR Left ~ 1980   "cyst removed"  . TONSILLECTOMY       Social History   Socioeconomic History  . Marital status: Married    Spouse name: Not on file  . Number of children: Not on file  . Years of education: Not on file  . Highest education level: Not on file  Occupational History  . Not on file  Social Needs  . Financial resource strain: Not on file  . Food insecurity    Worry: Not on file    Inability: Not on file  . Transportation needs    Medical: Not on file    Non-medical: Not on file  Tobacco Use  . Smoking status: Never Smoker  . Smokeless tobacco: Former Systems developer    Types: Chew  Substance and Sexual Activity  . Alcohol use: Yes    Comment: 04/27/2017 "1-2 drinks/year; if that"  . Drug use: No  . Sexual activity: Not Currently  Lifestyle  . Physical activity    Days per week: Not on file    Minutes per session: Not on file  . Stress: Not on file  Relationships  . Social  connections    Talks on phone: Not on file    Gets together: Not on file    Attends religious service: Not on file    Active member of club or organization: Not on file    Attends meetings of clubs or organizations: Not on file    Relationship status: Not on file  . Intimate partner violence    Fear of current or ex partner: Not on file    Emotionally abused: Not on file    Physically abused: Not on file    Forced sexual activity: Not on file  Other Topics Concern  . Not on file  Social History Narrative  . Not on file     Family History  Problem Relation Age of Onset  . Breast cancer Maternal Grandmother   . Leukemia Paternal Grandfather   . Heart disease Father   . COPD Father   . Colon cancer Neg Hx   . Esophageal  cancer Neg Hx   . Rectal cancer Neg Hx   . Stomach cancer Neg Hx      Current Outpatient Medications on File Prior to Visit  Medication Sig Dispense Refill  . aspirin 81 MG chewable tablet Chew 1 tablet (81 mg total) by mouth daily. 90 tablet 6  . cetirizine (ZYRTEC) 10 MG tablet Take 10 mg by mouth daily.     . diazepam (VALIUM) 5 MG tablet Take 5 mg by mouth 2 (two) times daily as needed for muscle spasms.     . DULoxetine (CYMBALTA) 30 MG capsule Take 30 mg by mouth 2 (two) times daily.     Marland Kitchen ezetimibe (ZETIA) 10 MG tablet Take 10 mg by mouth daily.    . fluticasone (FLONASE) 50 MCG/ACT nasal spray Place 1 spray into both nostrils at bedtime.    . metaxalone (SKELAXIN) 800 MG tablet Take 800 mg by mouth daily as needed for muscle spasms.     . methocarbamol (ROBAXIN) 500 MG tablet Take 500 mg by mouth as needed.     . metoprolol succinate (TOPROL-XL) 25 MG 24 hr tablet Take 1.5 tablets (37.5 mg total) by mouth daily. 90 tablet 3  . nitroGLYCERIN (NITROSTAT) 0.4 MG SL tablet Place 1 tablet (0.4 mg total) under the tongue every 5 (five) minutes as needed for chest pain. 90 tablet 3  . oxyCODONE-acetaminophen (PERCOCET/ROXICET) 5-325 MG tablet Take 1 tablet by mouth every 6 (six) hours as needed for severe pain.    . pantoprazole (PROTONIX) 40 MG tablet Take 1 tablet (40 mg total) by mouth daily. 30 tablet 6  . Polyethyl Glycol-Propyl Glycol (SYSTANE ULTRA) 0.4-0.3 % SOLN Place 1-2 drops into both eyes daily as needed (eye irritation).    . rosuvastatin (CRESTOR) 20 MG tablet Take 1 tablet (20 mg total) by mouth at bedtime. 30 tablet 11  . traMADol (ULTRAM) 50 MG tablet Take 50 mg by mouth every 6 (six) hours as needed for moderate pain.     No current facility-administered medications on file prior to visit.     Cardiovascular studies:  Carotid artery duplex  02/24/2019: Minimal plaque in the right internal carotid artery (minimal). Stenosis in the left internal carotid artery  (50-69%) with moderate diffuse heterogeneous plaque. Antegrade right vertebral artery flow. Antegrade left vertebral artery flow. No significant change from 07/25/2018. Follow up in six months is appropriate if clinically indicated.  Coronary angiography 04/27/2017: Single vessel severe coronary arter disease OM1 tandem 90% & 60% stenoses with resting Pd/Pa 0.79.  Successful percutaneous coronary intervention     PTCA and overlapping stents placement OM1 Synergy DES 3.5 X 12 mm & 3.0 X 12 mm distally  Echocardiogram 04/01/2017: Left ventricle cavity is normal in size. Normal global wall motion. Normal diastolic filling pattern. Calculated EF 67%. Left atrial cavity is normal in size. 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 No evidence of pulmonary hypertension.   Recent labs: 01/30/2019: Glucose 107. BUN/Cr 13/1.03. eGFR 79. Na/K 143/4.5. Rest of the CMP normal.  Chol 141, TG 172, HDL 43, LDL 64.   Review of Systems  Constitution: Negative for decreased appetite, malaise/fatigue, weight gain and weight loss.  HENT: Negative for congestion.   Eyes: Negative for visual disturbance.  Cardiovascular: Negative for chest pain, dyspnea on exertion, leg swelling, palpitations and syncope.  Respiratory: Negative for cough.   Endocrine: Negative for cold intolerance.  Hematologic/Lymphatic: Does not bruise/bleed easily.  Skin: Negative for itching and rash.  Musculoskeletal: Negative for myalgias.  Gastrointestinal: Negative for abdominal pain, nausea and vomiting.  Genitourinary: Negative for dysuria.  Neurological: Negative for dizziness and weakness.  Psychiatric/Behavioral: The patient is not nervous/anxious.   All other systems reviewed and are negative.   Vitals:   05/04/19 1132 05/04/19 1147  BP: (!) 150/78 119/76  Pulse: 72 70  Temp: 97.7 F (36.5 C)   SpO2: 96%      Objective:    Physical Exam  Constitutional: He  is oriented to person, place, and time. He appears well-developed and well-nourished. No distress.  HENT:  Head: Normocephalic and atraumatic.  Eyes: Pupils are equal, round, and reactive to light. Conjunctivae are normal.  Neck: No JVD present.  Cardiovascular: Normal rate, regular rhythm and intact distal pulses.  Pulmonary/Chest: Effort normal and breath sounds normal. He has no wheezes. He has no rales.  Abdominal: Soft. Bowel sounds are normal. There is no rebound.  Musculoskeletal:        General: No edema.  Lymphadenopathy:    He has no cervical adenopathy.  Neurological: He is alert and oriented to person, place, and time. No cranial nerve deficit.  Skin: Skin is warm and dry.  Psychiatric: He has a normal mood and affect.  Nursing note and vitals reviewed.       Assessment & Recommendations:   58 y/o Caucasian male with CAD s/p LCx/OM PCI 04/2017, mod asympytomatic lt carotid stenosis, hypertension, hyperlipidemia.  CAD: Stable. No angina symptoms.  Continue aspirin, crestor + zetia, metoprolol succinate. Low cardiac risk for shoulder surgeries/ epidural injection. After these procedures, I will consider adding Xarelto 2.5 mg bid to reduce major adverse cardiac and vascular events.   Hyperlipidemia: Significant improvement on crestor + zetia.  Mod lt ICA stenosis: Asymptomatic. Continue aggressive medical management. Repeat carotid duplex in 6 months.   Nigel Mormon, MD [facility] [place] Pager: [phone] Office: [phone] If no answer Cell [phone]

## 2019-05-09 NOTE — Progress Notes (Signed)
PCP - Jani Gravel Cardiologist - Dr. Jeanella Anton epic 05-04-19 with clearance   Chest x-ray -  EKG - 02-06-19 epic Stress Test -  ECHO -  Cardiac Cath - 04-27-17 epic  Sleep Study -  CPAP -   Fasting Blood Sugar -  Checks Blood Sugar _____ times a day  Blood Thinner Instructions: Aspirin Instructions:81 mg  Last Dose:l10-18  Anesthesia review: cardiac stent, osa cpap  Patient denies shortness of breath, fever, cough and chest pain at PAT appointment  NONE   Patient verbalized understanding of instructions that were given to them at the PAT appointment. Patient was also instructed that they will need to review over the PAT instructions again at home before surgery. note

## 2019-05-09 NOTE — Patient Instructions (Addendum)
DUE TO COVID-19 ONLY ONE VISITOR IS ALLOWED TO COME WITH YOU AND STAY IN THE WAITING ROOM ONLY DURING PRE OP AND PROCEDURE DAY OF SURGERY. THE 1 VISITOR MAY VISIT WITH YOU AFTER SURGERY IN YOUR PRIVATE ROOM DURING VISITING HOURS ONLY!  YOU NEED TO HAVE A COVID 19 TEST ON_______ @_______ , THIS TEST MUST BE DONE BEFORE SURGERY, COME  [place], [place] , [address].  ([place]) ONCE YOUR COVID TEST IS COMPLETED, PLEASE BEGIN THE QUARANTINE INSTRUCTIONS AS OUTLINED IN YOUR HANDOUT.                Najai Nie  05/09/2019   Your procedure is scheduled on: 05-18-19   Report to [facility] Main  Entrance   Report to short stay  at        0530 AM     Call this number if you have problems the morning of surgery [phone]    Remember:NO SOLID FOOD AFTER MIDNIGHT THE NIGHT PRIOR TO SURGERY. NOTHING BY MOUTH EXCEPT CLEAR LIQUIDS UNTIL     0430 am . PLEASE FINISH ENSURE DRINK PER              SURGEON ORDER  WHICH NEEDS TO BE COMPLETED AT      0430 am then nothing by mouth .    CLEAR LIQUID DIET   Foods Allowed                                                                     Foods Excluded  Coffee and tea, regular and decaf                             liquids that you cannot  Plain Jell-O any favor except red or purple                                           see through such as: Fruit ices (not with fruit pulp)                                     milk, soups, orange juice  Iced Popsicles                                    All solid food Carbonated beverages, regular and diet                                    Cranberry, grape and apple juices Sports drinks like Gatorade Lightly seasoned clear broth or consume(fat free) Sugar, honey syrup   _____________________________________________________________________    BRUSH YOUR TEETH MORNING OF SURGERY AND RINSE YOUR MOUTH OUT, NO CHEWING GUM CANDY OR MINTS.     Take these medicines the morning  of surgery with A SIP OF WATER: protonix, oxycodone if needed, metoprolol, zetia, cymbalta, zyrtec  DO NOT TAKE  ANY DIABETIC MEDICATIONS DAY OF YOUR SURGERY                               You may not have any metal on your body including hair pins and              piercings  Do not wear jewelry,  lotions, powders or perfumes, deodorant              Men may shave face and neck.   Do not bring valuables to the hospital. [place].  Contacts, dentures or bridgework may not be worn into surgery.                Please read over the following fact sheets you were given: _____________________________________________________________________          [facility] - Preparing for Surgery Before surgery, you can play an important role.  Because skin is not sterile, your skin needs to be as free of germs as possible.  You can reduce the number of germs on your skin by washing with CHG (chlorahexidine gluconate) soap before surgery.  CHG is an antiseptic cleaner which kills germs and bonds with the skin to continue killing germs even after washing. Please DO NOT use if you have an allergy to CHG or antibacterial soaps.  If your skin becomes reddened/irritated stop using the CHG and inform your nurse when you arrive at Short Stay. Do not shave (including legs and underarms) for at least 48 hours prior to the first CHG shower.  You may shave your face/neck. Please follow these instructions carefully:  1.  Shower with CHG Soap the night before surgery and the  morning of Surgery.  2.  If you choose to wash your hair, wash your hair first as usual with your  normal  shampoo.  3.  After you shampoo, rinse your hair and body thoroughly to remove the  shampoo.                           4.  Use CHG as you would any other liquid soap.  You can apply chg directly  to the skin and wash                       Gently with a scrungie or clean washcloth.  5.  Apply the  CHG Soap to your body ONLY FROM THE NECK DOWN.   Do not use on face/ open                           Wound or open sores. Avoid contact with eyes, ears mouth and genitals (private parts).                       Wash face,  Genitals (private parts) with your normal soap.             6.  Wash thoroughly, paying special attention to the area where your surgery  will be performed.  7.  Thoroughly rinse your body with warm water from the neck down.  8.  DO NOT shower/wash with your normal soap after using and rinsing off  the CHG Soap.  9.  Pat yourself dry with a clean towel.            10.  Wear clean pajamas.            11.  Place clean sheets on your bed the night of your first shower and do not  sleep with pets. Day of Surgery : Do not apply any lotions/deodorants the morning of surgery.  Please wear clean clothes to the hospital/surgery center.  FAILURE TO FOLLOW THESE INSTRUCTIONS MAY RESULT IN THE CANCELLATION OF YOUR SURGERY PATIENT SIGNATURE_________________________________  NURSE SIGNATURE__________________________________  ________________________________________________________________________   Adam Phenix  An incentive spirometer is a tool that can help keep your lungs clear and active. This tool measures how well you are filling your lungs with each breath. Taking long deep breaths may help reverse or decrease the chance of developing breathing (pulmonary) problems (especially infection) following:  A long period of time when you are unable to move or be active. BEFORE THE PROCEDURE   If the spirometer includes an indicator to show your best effort, your nurse or respiratory therapist will set it to a desired goal.  If possible, sit up straight or lean slightly forward. Try not to slouch.  Hold the incentive spirometer in an upright position. INSTRUCTIONS FOR USE  1. Sit on the edge of your bed if possible, or sit up as far as you can in bed or on a  chair. 2. Hold the incentive spirometer in an upright position. 3. Breathe out normally. 4. Place the mouthpiece in your mouth and seal your lips tightly around it. 5. Breathe in slowly and as deeply as possible, raising the piston or the ball toward the top of the column. 6. Hold your breath for 3-5 seconds or for as long as possible. Allow the piston or ball to fall to the bottom of the column. 7. Remove the mouthpiece from your mouth and breathe out normally. 8. Rest for a few seconds and repeat Steps 1 through 7 at least 10 times every 1-2 hours when you are awake. Take your time and take a few normal breaths between deep breaths. 9. The spirometer may include an indicator to show your best effort. Use the indicator as a goal to work toward during each repetition. 10. After each set of 10 deep breaths, practice coughing to be sure your lungs are clear. If you have an incision (the cut made at the time of surgery), support your incision when coughing by placing a pillow or rolled up towels firmly against it. Once you are able to get out of bed, walk around indoors and cough well. You may stop using the incentive spirometer when instructed by your caregiver.  RISKS AND COMPLICATIONS  Take your time so you do not get dizzy or light-headed.  If you are in pain, you may need to take or ask for pain medication before doing incentive spirometry. It is harder to take a deep breath if you are having pain. AFTER USE  Rest and breathe slowly and easily.  It can be helpful to keep track of a log of your progress. Your caregiver can provide you with a simple table to help with this. If you are using the spirometer at home, follow these instructions: [place] IF:   You are having difficultly using the spirometer.  You have trouble using the spirometer as often as instructed.  Your pain medication is not giving enough relief while using the spirometer.  You develop  fever of 100.5 F  (38.1 C) or higher. SEEK IMMEDIATE MEDICAL CARE IF:   You cough up bloody sputum that had not been present before.  You develop fever of 102 F (38.9 C) or greater.  You develop worsening pain at or near the incision site. MAKE SURE YOU:   Understand these instructions.  Will watch your condition.  Will get help right away if you are not doing well or get worse. Document Released: 11/16/2006 Document Revised: 09/28/2011 Document Reviewed: 01/17/2007 [facility] Patient Information 2014 [place], [place].   ________________________________________________________________________

## 2019-05-10 ENCOUNTER — Encounter (HOSPITAL_COMMUNITY)
Admission: RE | Admit: 2019-05-10 | Discharge: 2019-05-10 | Disposition: A | Discharge status: Home / Self Care | Payer: Managed Care, Other (non HMO) | Source: Ambulatory Visit | Attending: Orthopedic Surgery | Admitting: Orthopedic Surgery

## 2019-05-10 ENCOUNTER — Other Ambulatory Visit: Payer: Self-pay

## 2019-05-10 ENCOUNTER — Encounter (HOSPITAL_COMMUNITY): Payer: Self-pay

## 2019-05-10 DIAGNOSIS — E785 Hyperlipidemia, unspecified: Secondary | ICD-10-CM | POA: Insufficient documentation

## 2019-05-10 DIAGNOSIS — Z01812 Encounter for preprocedural laboratory examination: Secondary | ICD-10-CM | POA: Diagnosis present

## 2019-05-10 DIAGNOSIS — I2510 Atherosclerotic heart disease of native coronary artery without angina pectoris: Secondary | ICD-10-CM | POA: Diagnosis not present

## 2019-05-10 DIAGNOSIS — I6522 Occlusion and stenosis of left carotid artery: Secondary | ICD-10-CM | POA: Insufficient documentation

## 2019-05-10 DIAGNOSIS — Z79899 Other long term (current) drug therapy: Secondary | ICD-10-CM | POA: Insufficient documentation

## 2019-05-10 DIAGNOSIS — G4733 Obstructive sleep apnea (adult) (pediatric): Secondary | ICD-10-CM | POA: Diagnosis not present

## 2019-05-10 DIAGNOSIS — H409 Unspecified glaucoma: Secondary | ICD-10-CM | POA: Diagnosis not present

## 2019-05-10 DIAGNOSIS — Z7982 Long term (current) use of aspirin: Secondary | ICD-10-CM | POA: Insufficient documentation

## 2019-05-10 DIAGNOSIS — F329 Major depressive disorder, single episode, unspecified: Secondary | ICD-10-CM | POA: Insufficient documentation

## 2019-05-10 DIAGNOSIS — K219 Gastro-esophageal reflux disease without esophagitis: Secondary | ICD-10-CM | POA: Insufficient documentation

## 2019-05-10 DIAGNOSIS — M19012 Primary osteoarthritis, left shoulder: Secondary | ICD-10-CM | POA: Insufficient documentation

## 2019-05-10 DIAGNOSIS — I10 Essential (primary) hypertension: Secondary | ICD-10-CM | POA: Diagnosis not present

## 2019-05-10 HISTORY — DX: Atherosclerotic heart disease of native coronary artery without angina pectoris: I25.10

## 2019-05-10 HISTORY — DX: Anxiety disorder, unspecified: F41.9

## 2019-05-10 HISTORY — DX: Essential (primary) hypertension: I10

## 2019-05-10 HISTORY — DX: Occlusion and stenosis of unspecified carotid artery: I65.29

## 2019-05-10 LAB — BASIC METABOLIC PANEL
Anion gap: 9 (ref 5–15)
BUN: 12 mg/dL (ref 6–20)
CO2: 26 mmol/L (ref 22–32)
Calcium: 8.8 mg/dL — ABNORMAL LOW (ref 8.9–10.3)
Chloride: 100 mmol/L (ref 98–111)
Creatinine, Ser: 1.17 mg/dL (ref 0.61–1.24)
GFR calc Af Amer: >60 mL/min (ref >60)
GFR calc non Af Amer: >60 mL/min (ref >60)
Glucose, Bld: 143 mg/dL — ABNORMAL HIGH (ref 70–99)
Potassium: 4.1 mmol/L (ref 3.5–5.1)
Sodium: 135 mmol/L (ref 135–145)

## 2019-05-10 LAB — CBC
HCT: 46.2 % (ref 39.0–52.0)
Hemoglobin: 15.4 g/dL (ref 13.0–17.0)
MCH: 31.4 pg (ref 26.0–34.0)
MCHC: 33.3 g/dL (ref 30.0–36.0)
MCV: 94.3 fL (ref 80.0–100.0)
Platelets: 198 10*3/uL (ref 150–400)
RBC: 4.9 MIL/uL (ref 4.22–5.81)
RDW: 12.3 % (ref 11.5–15.5)
WBC: 5.3 10*3/uL (ref 4.0–10.5)
nRBC: 0 % (ref 0.0–0.2)

## 2019-05-10 LAB — SURGICAL PCR SCREEN
MRSA, PCR: POSITIVE — AB
Staphylococcus aureus: POSITIVE — AB

## 2019-05-10 NOTE — Addendum Note (Signed)
Addended by: Nigel Mormon on: 05/10/2019 04:47 PM   Modules accepted: Orders

## 2019-05-11 ENCOUNTER — Encounter (HOSPITAL_COMMUNITY): Payer: Self-pay

## 2019-05-11 NOTE — Anesthesia Preprocedure Evaluation (Addendum)
Anesthesia Evaluation  Patient identified by MRN, date of birth, ID band Patient awake    Reviewed: Allergy & Precautions, NPO status , Patient's Chart, lab work & pertinent test results, reviewed documented beta blocker date and time   History of Anesthesia Complications (+) Family history of anesthesia reaction  Airway Mallampati: II  TM Distance: >3 FB     Dental no notable dental hx. (+) Teeth Intact, Caps, Dental Advisory Given   Pulmonary sleep apnea and Continuous Positive Airway Pressure Ventilation ,    Pulmonary exam normal breath sounds clear to auscultation       Cardiovascular hypertension, + angina with exertion + CAD, + Cardiac Stents and + Peripheral Vascular Disease  Normal cardiovascular exam Rhythm:Regular Rate:Normal  Left Carotid stenosis LCx/OM PCI  04/27/2017 Cardiac cath Single vessel severe coronary arter disease OM1 tandem 90% & 60% stenoses with resting Pd/Pa 0.79 Successful percutaneous coronary intervention      PTCA and overlapping stents placement OM1     Synergy DES 3.5 X 12 mm & 3.0 X 12 mm distally  EKG NSR, RBBB pattern, RVH  Carotid doppler 0000000 RICA [id number]  LICA 0000000   Neuro/Psych PSYCHIATRIC DISORDERS Anxiety Depression negative neurological ROS     GI/Hepatic hiatal hernia, GERD  Medicated and Controlled,  Endo/Other  diabetes, Well Controlled, Type 2Hyperlipidemia  Renal/GU   negative genitourinary   Musculoskeletal  (+) Arthritis , Osteoarthritis,  Left shoulder arthritis   Abdominal   Peds  Hematology negative hematology ROS (+)   Anesthesia Other Findings   Reproductive/Obstetrics                          Anesthesia Physical Anesthesia Plan  ASA: III  Anesthesia Plan: General   Post-op Pain Management:  Regional for Post-op pain   Induction: Intravenous  PONV Risk Score and Plan: 3 and Midazolam, Ondansetron, Treatment may vary  due to age or medical condition and Dexamethasone  Airway Management Planned: Oral ETT  Additional Equipment:   Intra-op Plan:   Post-operative Plan: Extubation in OR  Informed Consent: I have reviewed the patients History and Physical, chart, labs and discussed the procedure including the risks, benefits and alternatives for the proposed anesthesia with the patient or authorized representative who has indicated his/her understanding and acceptance.     Dental advisory given  Plan Discussed with: CRNA and Surgeon  Anesthesia Plan Comments: (See APP note by Durel Salts, FNP)      Anesthesia Quick Evaluation

## 2019-05-11 NOTE — Progress Notes (Signed)
Anesthesia Chart Review:   Case: [id number] Date/Time: 05/18/19 0715   Procedure: Left Shoulder hardware removal, Anatomic shoulder arthroplasty, possible Reverse Arthroplasty (Left Shoulder) - 132min   Anesthesia type: General   Pre-op diagnosis: Left shoulder osteoarthritis   Location: [facility] ROOM 06 / [facility]   Surgeon: Justice Britain, MD      DISCUSSION:  - Pt is a 58 year old male with hx CAD (2 overlapping DES to OM1 04/27/17), HTN, left carotid stenosis (moderate by 01/26/19 duplex), OSA  - Pt has cardiac clearance for surgery   VS: BP 139/76   Pulse 71   Temp 36.8 C (Oral)   Resp 16   Ht 5\' 11"  (1.803 m)   Wt 98.8 kg   SpO2 98%   BMI 30.39 kg/m    PROVIDERS: - PCP is Jani Gravel, MD - Cardiologist is Vernell Leep, MD who cleared pt for surgery at last office visit 05/04/19   LABS: Labs reviewed: Acceptable for surgery. (all labs ordered are listed, but only abnormal results are displayed)  Labs Reviewed  SURGICAL PCR SCREEN - Abnormal; Notable for the following components:      Result Value   MRSA, PCR POSITIVE (*)    Staphylococcus aureus POSITIVE (*)    All other components within normal limits  BASIC METABOLIC PANEL - Abnormal; Notable for the following components:   Glucose, Bld 143 (*)    Calcium 8.8 (*)    All other components within normal limits  CBC     EKG 02/06/19: SR, with occasional PVC. RBBB and right axis- possible RVH.    CV:  Carotid artery duplex 01/26/2019: - Minimal plaque in the right internal carotid artery (minimal). - Stenosis in the left internal carotid artery (50-69%) with moderate diffuse heterogeneous plaque. - Antegrade right vertebral artery flow. Antegrade left vertebral artery flow. - No significant change from 07/25/2018. Follow up in six months is appropriate if clinically indicated.  Coronary angiography 04/27/2017: - Single vessel severe coronary arter disease OM1 tandem 90% & 60% stenoses with resting Pd/Pa  0.79. - Successful percutaneous coronary intervention  - PTCA and overlapping stents placement OM1 Synergy DES 3.5 X 12 mm & 3.0 X 12 mm distally  Echocardiogram 04/01/2017 (see correspondence 04/29/17 in media tab): - Left ventricle cavity is normal in size. Normal global wall motion. Normal diastolic filling pattern. Calculated EF 67%. - Left atrial cavity is normal in size. 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 - No evidence of pulmonary hypertension.    Past Medical History:  Diagnosis Date  . Allergy   . Anxiety   . Arthritis    "knees, shoulders, back" (04/27/2017)  . Carotid artery stenosis   . Chronic back pain    "5 ruptured discs in the middle; 1 ruptured disc in the lower" (04/27/2017)  . Coronary artery disease    2 cardiac stents  oct 2018  . Depression   . Diverticulitis   . Exertional heat stroke ~ 01/2017  . Family history of adverse reaction to anesthesia    "mom had emergency appy at age 64; she had severe short term memory loss; never recovered"  . GERD (gastroesophageal reflux disease)   . Glaucoma    laser treated   narrow angle  . History of hiatal hernia   . HLD (hyperlipidemia) 02/01/2019  . Hyperlipidemia   . Hypertension   . OSA on CPAP   . PVC (premature ventricular contraction)   . Sleep  apnea     Past Surgical History:  Procedure Laterality Date  . CORONARY ANGIOPLASTY WITH STENT PLACEMENT  04/27/2017   x2   . CORONARY STENT INTERVENTION N/A 04/27/2017   Procedure: CORONARY STENT INTERVENTION;  Surgeon: Nigel Mormon, MD;  Location: [place] [facility];  Service: Cardiovascular;  Laterality: N/A;  . LEFT HEART CATH AND CORONARY ANGIOGRAPHY N/A 04/27/2017   Procedure: LEFT HEART CATH AND CORONARY ANGIOGRAPHY;  Surgeon: Nigel Mormon, MD;  Location: [place] [facility];  Service: Cardiovascular;  Laterality: N/A;  . SHOULDER SURGERY Left ~ 1992   "tendon relocation; cut me  open to do it"  . SHOULDER SURGERY Right 08/04/2018  . SURGERY SCROTAL / TESTICULAR Left ~ 1980   "cyst removed"  . TONSILLECTOMY      MEDICATIONS: . aspirin 81 MG chewable tablet  . cetirizine (ZYRTEC) 10 MG tablet  . diazepam (VALIUM) 5 MG tablet  . DULoxetine (CYMBALTA) 30 MG capsule  . ezetimibe (ZETIA) 10 MG tablet  . fluticasone (FLONASE) 50 MCG/ACT nasal spray  . metaxalone (SKELAXIN) 800 MG tablet  . methocarbamol (ROBAXIN) 500 MG tablet  . metoprolol succinate (TOPROL-XL) 25 MG 24 hr tablet  . nitroGLYCERIN (NITROSTAT) 0.4 MG SL tablet  . oxyCODONE-acetaminophen (PERCOCET/ROXICET) 5-325 MG tablet  . pantoprazole (PROTONIX) 40 MG tablet  . Polyethyl Glycol-Propyl Glycol (SYSTANE ULTRA) 0.4-0.3 % SOLN  . rosuvastatin (CRESTOR) 20 MG tablet   No current facility-administered medications for this encounter.     If no changes, I anticipate pt can proceed with surgery as scheduled.   Willeen Cass, FNP-BC [facility]/Anesthesiology Phone: [phone] 05/11/2019 12:08 PM

## 2019-05-15 ENCOUNTER — Other Ambulatory Visit (HOSPITAL_COMMUNITY)
Admission: RE | Admit: 2019-05-15 | Discharge: 2019-05-15 | Disposition: A | Discharge status: Home / Self Care | Payer: Managed Care, Other (non HMO) | Source: Ambulatory Visit | Attending: Orthopedic Surgery | Admitting: Orthopedic Surgery

## 2019-05-15 DIAGNOSIS — Z20828 Contact with and (suspected) exposure to other viral communicable diseases: Secondary | ICD-10-CM | POA: Insufficient documentation

## 2019-05-15 DIAGNOSIS — Z01812 Encounter for preprocedural laboratory examination: Secondary | ICD-10-CM | POA: Diagnosis present

## 2019-05-16 LAB — NOVEL CORONAVIRUS, NAA (HOSP ORDER, SEND-OUT TO REF LAB; TAT 18-24 HRS): SARS-CoV-2, NAA: NOT DETECTED

## 2019-05-18 ENCOUNTER — Ambulatory Visit (HOSPITAL_COMMUNITY): Payer: Managed Care, Other (non HMO) | Admitting: Anesthesiology

## 2019-05-18 ENCOUNTER — Encounter (HOSPITAL_COMMUNITY): Payer: Self-pay | Admitting: *Deleted

## 2019-05-18 ENCOUNTER — Other Ambulatory Visit: Payer: Self-pay

## 2019-05-18 ENCOUNTER — Ambulatory Visit (HOSPITAL_COMMUNITY): Payer: Managed Care, Other (non HMO)

## 2019-05-18 ENCOUNTER — Encounter (HOSPITAL_COMMUNITY)
Admission: RE | Disposition: A | Discharge status: Home / Self Care | Payer: Self-pay | Source: Home / Self Care | Attending: Orthopedic Surgery

## 2019-05-18 ENCOUNTER — Ambulatory Visit (HOSPITAL_COMMUNITY)
Admission: RE | Admit: 2019-05-18 | Discharge: 2019-05-19 | Disposition: A | Discharge status: Home / Self Care | Payer: Managed Care, Other (non HMO) | Attending: Orthopedic Surgery | Admitting: Orthopedic Surgery

## 2019-05-18 DIAGNOSIS — F329 Major depressive disorder, single episode, unspecified: Secondary | ICD-10-CM | POA: Insufficient documentation

## 2019-05-18 DIAGNOSIS — M170 Bilateral primary osteoarthritis of knee: Secondary | ICD-10-CM | POA: Insufficient documentation

## 2019-05-18 DIAGNOSIS — Z8249 Family history of ischemic heart disease and other diseases of the circulatory system: Secondary | ICD-10-CM | POA: Insufficient documentation

## 2019-05-18 DIAGNOSIS — M479 Spondylosis, unspecified: Secondary | ICD-10-CM | POA: Insufficient documentation

## 2019-05-18 DIAGNOSIS — E782 Mixed hyperlipidemia: Secondary | ICD-10-CM | POA: Diagnosis not present

## 2019-05-18 DIAGNOSIS — M19012 Primary osteoarthritis, left shoulder: Secondary | ICD-10-CM | POA: Insufficient documentation

## 2019-05-18 DIAGNOSIS — K219 Gastro-esophageal reflux disease without esophagitis: Secondary | ICD-10-CM | POA: Diagnosis not present

## 2019-05-18 DIAGNOSIS — H42 Glaucoma in diseases classified elsewhere: Secondary | ICD-10-CM | POA: Diagnosis not present

## 2019-05-18 DIAGNOSIS — I10 Essential (primary) hypertension: Secondary | ICD-10-CM | POA: Diagnosis not present

## 2019-05-18 DIAGNOSIS — E1139 Type 2 diabetes mellitus with other diabetic ophthalmic complication: Secondary | ICD-10-CM | POA: Insufficient documentation

## 2019-05-18 DIAGNOSIS — Z79899 Other long term (current) drug therapy: Secondary | ICD-10-CM | POA: Diagnosis not present

## 2019-05-18 DIAGNOSIS — E1151 Type 2 diabetes mellitus with diabetic peripheral angiopathy without gangrene: Secondary | ICD-10-CM | POA: Insufficient documentation

## 2019-05-18 DIAGNOSIS — Z955 Presence of coronary angioplasty implant and graft: Secondary | ICD-10-CM | POA: Insufficient documentation

## 2019-05-18 DIAGNOSIS — G4733 Obstructive sleep apnea (adult) (pediatric): Secondary | ICD-10-CM | POA: Diagnosis not present

## 2019-05-18 DIAGNOSIS — Z885 Allergy status to narcotic agent status: Secondary | ICD-10-CM | POA: Diagnosis not present

## 2019-05-18 DIAGNOSIS — Z96612 Presence of left artificial shoulder joint: Secondary | ICD-10-CM

## 2019-05-18 DIAGNOSIS — Z87891 Personal history of nicotine dependence: Secondary | ICD-10-CM | POA: Diagnosis not present

## 2019-05-18 DIAGNOSIS — Z7982 Long term (current) use of aspirin: Secondary | ICD-10-CM | POA: Insufficient documentation

## 2019-05-18 DIAGNOSIS — F419 Anxiety disorder, unspecified: Secondary | ICD-10-CM | POA: Insufficient documentation

## 2019-05-18 DIAGNOSIS — I2510 Atherosclerotic heart disease of native coronary artery without angina pectoris: Secondary | ICD-10-CM | POA: Insufficient documentation

## 2019-05-18 HISTORY — PX: TOTAL SHOULDER ARTHROPLASTY: SHX126

## 2019-05-18 SURGERY — ARTHROPLASTY, SHOULDER, TOTAL
Anesthesia: General | Site: Shoulder | Laterality: Left

## 2019-05-18 MED ORDER — TEMAZEPAM 15 MG PO CAPS: 15.0000 mg | ORAL_CAPSULE | Freq: Every evening | ORAL | Status: DC | PRN

## 2019-05-18 MED ORDER — FENTANYL CITRATE (PF) 100 MCG/2ML IJ SOLN
INTRAMUSCULAR | Status: DC | PRN
  Administered 2019-05-18 (×2): 50 ug via INTRAVENOUS

## 2019-05-18 MED ORDER — ONDANSETRON HCL 4 MG/2ML IJ SOLN: 4.0000 mg | Freq: Once | INTRAMUSCULAR | Status: DC | PRN

## 2019-05-18 MED ORDER — CHLORHEXIDINE GLUCONATE 4 % EX LIQD: 60.0000 mL | Freq: Once | CUTANEOUS | Status: DC

## 2019-05-18 MED ORDER — ALUM & MAG HYDROXIDE-SIMETH 200-200-20 MG/5ML PO SUSP: 30.0000 mL | ORAL | Status: DC | PRN

## 2019-05-18 MED ORDER — ASPIRIN 81 MG PO CHEW
81.0000 mg | CHEWABLE_TABLET | Freq: Every day | ORAL | Status: DC
  Administered 2019-05-19: 10:00:00 81 mg via ORAL
  Filled 2019-05-18: qty 1

## 2019-05-18 MED ORDER — DEXAMETHASONE SODIUM PHOSPHATE 10 MG/ML IJ SOLN
INTRAMUSCULAR | Status: AC
  Filled 2019-05-18: qty 1

## 2019-05-18 MED ORDER — KETOROLAC TROMETHAMINE 15 MG/ML IJ SOLN
15.0000 mg | Freq: Four times a day (QID) | INTRAMUSCULAR | Status: AC
  Administered 2019-05-18 – 2019-05-19 (×4): 15 mg via INTRAVENOUS
  Filled 2019-05-18 (×4): qty 1

## 2019-05-18 MED ORDER — FLEET ENEMA 7-19 GM/118ML RE ENEM: 1.0000 | ENEMA | Freq: Once | RECTAL | Status: DC | PRN

## 2019-05-18 MED ORDER — FENTANYL CITRATE (PF) 100 MCG/2ML IJ SOLN
25.0000 ug | INTRAMUSCULAR | Status: DC | PRN
  Administered 2019-05-18 (×3): 50 ug via INTRAVENOUS

## 2019-05-18 MED ORDER — NITROGLYCERIN 0.4 MG SL SUBL: 0.4000 mg | SUBLINGUAL_TABLET | SUBLINGUAL | Status: DC | PRN

## 2019-05-18 MED ORDER — PANTOPRAZOLE SODIUM 40 MG PO TBEC
40.0000 mg | DELAYED_RELEASE_TABLET | Freq: Every day | ORAL | Status: DC
  Administered 2019-05-19: 10:00:00 40 mg via ORAL
  Filled 2019-05-18: qty 1

## 2019-05-18 MED ORDER — TRANEXAMIC ACID-NACL 1000-0.7 MG/100ML-% IV SOLN
1000.0000 mg | INTRAVENOUS | Status: AC
  Administered 2019-05-18: 1000 mg via INTRAVENOUS
  Filled 2019-05-18: qty 100

## 2019-05-18 MED ORDER — MENTHOL 3 MG MT LOZG: 1.0000 | LOZENGE | OROMUCOSAL | Status: DC | PRN

## 2019-05-18 MED ORDER — LACTATED RINGERS IV SOLN
INTRAVENOUS | Status: DC
  Administered 2019-05-18: 13:00:00 via INTRAVENOUS

## 2019-05-18 MED ORDER — FENTANYL CITRATE (PF) 100 MCG/2ML IJ SOLN
INTRAMUSCULAR | Status: AC
  Filled 2019-05-18: qty 4

## 2019-05-18 MED ORDER — METHOCARBAMOL 500 MG IVPB - SIMPLE MED
INTRAVENOUS | Status: AC
  Filled 2019-05-18: qty 50

## 2019-05-18 MED ORDER — DOCUSATE SODIUM 100 MG PO CAPS
100.0000 mg | ORAL_CAPSULE | Freq: Two times a day (BID) | ORAL | Status: DC
  Administered 2019-05-18 – 2019-05-19 (×2): 100 mg via ORAL
  Filled 2019-05-18 (×2): qty 1

## 2019-05-18 MED ORDER — PHENYLEPHRINE 40 MCG/ML (10ML) SYRINGE FOR IV PUSH (FOR BLOOD PRESSURE SUPPORT)
PREFILLED_SYRINGE | INTRAVENOUS | Status: AC
  Filled 2019-05-18: qty 10

## 2019-05-18 MED ORDER — FENTANYL CITRATE (PF) 250 MCG/5ML IJ SOLN
INTRAMUSCULAR | Status: AC
  Filled 2019-05-18: qty 5

## 2019-05-18 MED ORDER — VANCOMYCIN HCL 1000 MG IV SOLR: INTRAVENOUS | Status: DC | PRN

## 2019-05-18 MED ORDER — METHOCARBAMOL 500 MG IVPB - SIMPLE MED
500.0000 mg | Freq: Four times a day (QID) | INTRAVENOUS | Status: DC | PRN
  Administered 2019-05-18: 11:00:00 500 mg via INTRAVENOUS
  Filled 2019-05-18: qty 50

## 2019-05-18 MED ORDER — PHENOL 1.4 % MT LIQD: 1.0000 | OROMUCOSAL | Status: DC | PRN

## 2019-05-18 MED ORDER — POLYETHYLENE GLYCOL 3350 17 G PO PACK: 17.0000 g | PACK | Freq: Every day | ORAL | Status: DC | PRN

## 2019-05-18 MED ORDER — BUPIVACAINE LIPOSOME 1.3 % IJ SUSP
INTRAMUSCULAR | Status: DC | PRN
  Administered 2019-05-18: 10 mL via PERINEURAL

## 2019-05-18 MED ORDER — DIPHENHYDRAMINE HCL 12.5 MG/5ML PO ELIX: 12.5000 mg | ORAL_SOLUTION | ORAL | Status: DC | PRN

## 2019-05-18 MED ORDER — 0.9 % SODIUM CHLORIDE (POUR BTL) OPTIME
TOPICAL | Status: DC | PRN
  Administered 2019-05-18: 1000 mL

## 2019-05-18 MED ORDER — EZETIMIBE 10 MG PO TABS
10.0000 mg | ORAL_TABLET | Freq: Every day | ORAL | Status: DC
  Administered 2019-05-19: 10:00:00 10 mg via ORAL
  Filled 2019-05-18: qty 1

## 2019-05-18 MED ORDER — STERILE WATER FOR IRRIGATION IR SOLN
Status: DC | PRN
  Administered 2019-05-18: 2000 mL

## 2019-05-18 MED ORDER — METOCLOPRAMIDE HCL 5 MG PO TABS: 5.0000 mg | ORAL_TABLET | Freq: Three times a day (TID) | ORAL | Status: DC | PRN

## 2019-05-18 MED ORDER — ROCURONIUM BROMIDE 100 MG/10ML IV SOLN
INTRAVENOUS | Status: DC | PRN
  Administered 2019-05-18: 20 mg via INTRAVENOUS
  Administered 2019-05-18: 50 mg via INTRAVENOUS
  Administered 2019-05-18: 10 mg via INTRAVENOUS
  Administered 2019-05-18: 20 mg via INTRAVENOUS

## 2019-05-18 MED ORDER — LACTATED RINGERS IV SOLN
INTRAVENOUS | Status: DC
  Administered 2019-05-18 (×2): via INTRAVENOUS

## 2019-05-18 MED ORDER — ONDANSETRON HCL 4 MG PO TABS: 4.0000 mg | ORAL_TABLET | Freq: Four times a day (QID) | ORAL | Status: DC | PRN

## 2019-05-18 MED ORDER — BISACODYL 5 MG PO TBEC: 5.0000 mg | DELAYED_RELEASE_TABLET | Freq: Every day | ORAL | Status: DC | PRN

## 2019-05-18 MED ORDER — METOCLOPRAMIDE HCL 5 MG/ML IJ SOLN: 5.0000 mg | Freq: Three times a day (TID) | INTRAMUSCULAR | Status: DC | PRN

## 2019-05-18 MED ORDER — VANCOMYCIN HCL IN DEXTROSE 1-5 GM/200ML-% IV SOLN
1000.0000 mg | INTRAVENOUS | Status: AC
  Administered 2019-05-18: 07:00:00 1000 mg via INTRAVENOUS
  Filled 2019-05-18: qty 200

## 2019-05-18 MED ORDER — LIDOCAINE 2% (20 MG/ML) 5 ML SYRINGE
INTRAMUSCULAR | Status: DC | PRN
  Administered 2019-05-18 (×2): 50 mg via INTRAVENOUS

## 2019-05-18 MED ORDER — METOPROLOL SUCCINATE ER 25 MG PO TB24
37.5000 mg | ORAL_TABLET | Freq: Every day | ORAL | Status: DC
  Administered 2019-05-19: 10:00:00 37.5 mg via ORAL
  Filled 2019-05-18: qty 2

## 2019-05-18 MED ORDER — PROPOFOL 10 MG/ML IV BOLUS
INTRAVENOUS | Status: AC
  Filled 2019-05-18: qty 20

## 2019-05-18 MED ORDER — DEXAMETHASONE SODIUM PHOSPHATE 4 MG/ML IJ SOLN
INTRAMUSCULAR | Status: DC | PRN
  Administered 2019-05-18: 10 mg via INTRAVENOUS

## 2019-05-18 MED ORDER — OXYCODONE HCL 5 MG PO TABS
5.0000 mg | ORAL_TABLET | ORAL | Status: DC | PRN
  Administered 2019-05-18 – 2019-05-19 (×2): 10 mg via ORAL
  Filled 2019-05-18 (×3): qty 2

## 2019-05-18 MED ORDER — PROPOFOL 10 MG/ML IV BOLUS
INTRAVENOUS | Status: DC | PRN
  Administered 2019-05-18: 170 mg via INTRAVENOUS

## 2019-05-18 MED ORDER — ONDANSETRON HCL 4 MG/2ML IJ SOLN
INTRAMUSCULAR | Status: DC | PRN
  Administered 2019-05-18: 4 mg via INTRAVENOUS

## 2019-05-18 MED ORDER — PHENYLEPHRINE 40 MCG/ML (10ML) SYRINGE FOR IV PUSH (FOR BLOOD PRESSURE SUPPORT)
PREFILLED_SYRINGE | INTRAVENOUS | Status: DC | PRN
  Administered 2019-05-18: 80 ug via INTRAVENOUS

## 2019-05-18 MED ORDER — ACETAMINOPHEN 325 MG PO TABS: 325.0000 mg | ORAL_TABLET | Freq: Four times a day (QID) | ORAL | Status: DC | PRN

## 2019-05-18 MED ORDER — HYDROMORPHONE HCL 1 MG/ML IJ SOLN
0.5000 mg | INTRAMUSCULAR | Status: DC | PRN
  Administered 2019-05-18: 12:00:00 1 mg via INTRAVENOUS
  Filled 2019-05-18: qty 1

## 2019-05-18 MED ORDER — MIDAZOLAM HCL 5 MG/5ML IJ SOLN
INTRAMUSCULAR | Status: DC | PRN
  Administered 2019-05-18 (×2): 1 mg via INTRAVENOUS

## 2019-05-18 MED ORDER — ONDANSETRON HCL 4 MG/2ML IJ SOLN
INTRAMUSCULAR | Status: AC
  Filled 2019-05-18: qty 2

## 2019-05-18 MED ORDER — DULOXETINE HCL 30 MG PO CPEP
30.0000 mg | ORAL_CAPSULE | Freq: Two times a day (BID) | ORAL | Status: DC
  Administered 2019-05-18 – 2019-05-19 (×2): 30 mg via ORAL
  Filled 2019-05-18 (×2): qty 1

## 2019-05-18 MED ORDER — CEFAZOLIN SODIUM-DEXTROSE 2-4 GM/100ML-% IV SOLN
2.0000 g | INTRAVENOUS | Status: AC
  Administered 2019-05-18: 2 g via INTRAVENOUS
  Filled 2019-05-18: qty 100

## 2019-05-18 MED ORDER — OXYCODONE HCL 5 MG PO TABS
10.0000 mg | ORAL_TABLET | ORAL | Status: DC | PRN
  Administered 2019-05-18: 14:00:00 10 mg via ORAL

## 2019-05-18 MED ORDER — METHOCARBAMOL 500 MG PO TABS
500.0000 mg | ORAL_TABLET | Freq: Four times a day (QID) | ORAL | Status: DC | PRN
  Administered 2019-05-18 – 2019-05-19 (×2): 500 mg via ORAL
  Filled 2019-05-18 (×2): qty 1

## 2019-05-18 MED ORDER — ONDANSETRON HCL 4 MG/2ML IJ SOLN: 4.0000 mg | Freq: Four times a day (QID) | INTRAMUSCULAR | Status: DC | PRN

## 2019-05-18 MED ORDER — MIDAZOLAM HCL 2 MG/2ML IJ SOLN
INTRAMUSCULAR | Status: AC
  Filled 2019-05-18: qty 2

## 2019-05-18 MED ORDER — LIDOCAINE 2% (20 MG/ML) 5 ML SYRINGE
INTRAMUSCULAR | Status: AC
  Filled 2019-05-18: qty 5

## 2019-05-18 MED ORDER — BUPIVACAINE HCL 0.5 % IJ SOLN
INTRAMUSCULAR | Status: DC | PRN
  Administered 2019-05-18: 15 mL

## 2019-05-18 MED ORDER — PROPOFOL 10 MG/ML IV BOLUS
INTRAVENOUS | Status: AC
  Filled 2019-05-18: qty 40

## 2019-05-18 SURGICAL SUPPLY — 68 items
ADH SKN CLS APL DERMABOND .7 (GAUZE/BANDAGES/DRESSINGS) ×1
AID PSTN UNV HD RSTRNT DISP (MISCELLANEOUS) ×1
BAG SPEC THK2 15X12 ZIP CLS (MISCELLANEOUS) ×1
BAG ZIPLOCK 12X15 (MISCELLANEOUS) ×3 IMPLANT
BIT DRILL 2.0X128 (BIT) ×2 IMPLANT
BIT DRILL 2.0X128MM (BIT) ×1
BLADE SAW SGTL 83.5X18.5 (BLADE) ×3 IMPLANT
CEMENT BONE DEPUY (Cement) ×3 IMPLANT
COOLER ICEMAN CLASSIC (MISCELLANEOUS) ×2 IMPLANT
COVER BACK TABLE 60X90IN (DRAPES) ×3 IMPLANT
COVER SURGICAL LIGHT HANDLE (MISCELLANEOUS) ×3 IMPLANT
COVER WAND RF STERILE (DRAPES) IMPLANT
DERMABOND ADVANCED (GAUZE/BANDAGES/DRESSINGS) ×2
DERMABOND ADVANCED .7 DNX12 (GAUZE/BANDAGES/DRESSINGS) ×1 IMPLANT
DRAPE ORTHO SPLIT 77X108 STRL (DRAPES) ×6
DRAPE SHEET LG 3/4 BI-LAMINATE (DRAPES) ×3 IMPLANT
DRAPE SURG 17X11 SM STRL (DRAPES) ×3 IMPLANT
DRAPE SURG ORHT 6 SPLT 77X108 (DRAPES) ×2 IMPLANT
DRAPE U-SHAPE 47X51 STRL (DRAPES) ×3 IMPLANT
DRESSING AQUACEL AG SP 3.5X10 (GAUZE/BANDAGES/DRESSINGS) IMPLANT
DRSG AQUACEL AG ADV 3.5X10 (GAUZE/BANDAGES/DRESSINGS) ×3 IMPLANT
DRSG AQUACEL AG SP 3.5X10 (GAUZE/BANDAGES/DRESSINGS) ×3
DURAPREP 26ML APPLICATOR (WOUND CARE) ×6 IMPLANT
ELECT BLADE TIP CTD 4 INCH (ELECTRODE) ×3 IMPLANT
ELECT REM PT RETURN 15FT ADLT (MISCELLANEOUS) ×3 IMPLANT
FACESHIELD WRAPAROUND (MASK) ×12 IMPLANT
FACESHIELD WRAPAROUND OR TEAM (MASK) ×4 IMPLANT
GLENOID WITH CLEAT MEDIUM (Shoulder) ×2 IMPLANT
GLOVE BIO SURGEON STRL SZ 6 (GLOVE) ×4 IMPLANT
GLOVE BIO SURGEON STRL SZ7.5 (GLOVE) ×3 IMPLANT
GLOVE BIO SURGEON STRL SZ8 (GLOVE) ×3 IMPLANT
GLOVE SS BIOGEL STRL SZ 7 (GLOVE) ×1 IMPLANT
GLOVE SS BIOGEL STRL SZ 7.5 (GLOVE) ×1 IMPLANT
GLOVE SUPERSENSE BIOGEL SZ 7 (GLOVE) ×2
GLOVE SUPERSENSE BIOGEL SZ 7.5 (GLOVE) ×2
GOWN STRL REUS W/TWL LRG LVL3 (GOWN DISPOSABLE) ×6 IMPLANT
HEAD HUM UNIV 15X45 3D (Head) ×2 IMPLANT
KIT BASIN OR (CUSTOM PROCEDURE TRAY) ×3 IMPLANT
KIT TURNOVER KIT A (KITS) IMPLANT
MANIFOLD NEPTUNE II (INSTRUMENTS) ×3 IMPLANT
MARKER SKIN DUAL TIP RULER LAB (MISCELLANEOUS) ×3 IMPLANT
NDL TAPERED W/ NITINOL LOOP (MISCELLANEOUS) ×1 IMPLANT
NEEDLE TAPERED W/ NITINOL LOOP (MISCELLANEOUS) ×3 IMPLANT
NS IRRIG 1000ML POUR BTL (IV SOLUTION) ×3 IMPLANT
PACK SHOULDER (CUSTOM PROCEDURE TRAY) ×3 IMPLANT
PAD COLD SHLDR WRAP-ON (PAD) IMPLANT
PIN SET MODULAR GLENOID SYSTEM (PIN) ×2 IMPLANT
PROTECTOR NERVE ULNAR (MISCELLANEOUS) ×3 IMPLANT
RESTRAINT HEAD UNIVERSAL NS (MISCELLANEOUS) ×3 IMPLANT
SLING ARM FOAM STRAP LRG (SOFTGOODS) ×2 IMPLANT
SLING ARM FOAM STRAP MED (SOFTGOODS) IMPLANT
SMARTMIX MINI TOWER (MISCELLANEOUS)
SPONGE LAP 18X18 RF (DISPOSABLE) ×2 IMPLANT
STEM APEX UNIVERSAL 6MM SHOULD (Stem) ×2 IMPLANT
SUCTION FRAZIER HANDLE 12FR (TUBING) ×2
SUCTION TUBE FRAZIER 12FR DISP (TUBING) ×1 IMPLANT
SUT FIBERWIRE #2 38 T-5 BLUE (SUTURE)
SUT MNCRL AB 3-0 PS2 18 (SUTURE) ×3 IMPLANT
SUT MON AB 2-0 CT1 36 (SUTURE) ×3 IMPLANT
SUT VIC AB 1 CT1 36 (SUTURE) ×9 IMPLANT
SUTURE FIBERWR #2 38 T-5 BLUE (SUTURE) IMPLANT
SUTURE TAPE 1.3 40 TPR END (SUTURE) ×4 IMPLANT
SUTURETAPE 1.3 40 TPR END (SUTURE) ×12
TOWEL OR 17X26 10 PK STRL BLUE (TOWEL DISPOSABLE) ×3 IMPLANT
TOWEL OR NON WOVEN STRL DISP B (DISPOSABLE) ×3 IMPLANT
TOWER SMARTMIX MINI (MISCELLANEOUS) IMPLANT
WATER STERILE IRR 1000ML POUR (IV SOLUTION) ×6 IMPLANT
YANKAUER SUCT BULB TIP 10FT TU (MISCELLANEOUS) ×3 IMPLANT

## 2019-05-18 NOTE — Plan of Care (Signed)
  Problem: Education: Goal: Knowledge of the prescribed therapeutic regimen will improve Outcome: Progressing   Problem: Activity: Goal: Ability to tolerate increased activity will improve Outcome: Progressing   Problem: Pain Management: Goal: Pain level will decrease with appropriate interventions Outcome: Progressing   Problem: Clinical Measurements: Goal: Postoperative complications will be avoided or minimized Outcome: Progressing

## 2019-05-18 NOTE — Transfer of Care (Signed)
Immediate Anesthesia Transfer of Care Note  Patient: William Mason  Procedure(s) Performed: Left Shoulder hardware removal with Anatomic shoulder arthroplasty (Left Shoulder)  Patient Location: PACU  Anesthesia Type:General  Level of Consciousness: awake and alert   Airway & Oxygen Therapy: Patient Spontanous Breathing and Patient connected to face mask oxygen  Post-op Assessment: Report given to RN and Post -op Vital signs reviewed and stable  Post vital signs: Reviewed and stable  Last Vitals:  Vitals Value Taken Time  BP 145/78 05/18/19 1013  Temp    Pulse 74 05/18/19 1014  Resp 16 05/18/19 1014  SpO2 100 % 05/18/19 1014  Vitals shown include unvalidated device data.  Last Pain:  Vitals:   05/18/19 0613  TempSrc: Oral         Complications: No apparent anesthesia complications

## 2019-05-18 NOTE — Anesthesia Procedure Notes (Signed)
Procedure Name: Intubation Date/Time: 05/18/2019 7:48 AM Performed by: Lieutenant Diego, CRNA Pre-anesthesia Checklist: Patient identified, Emergency Drugs available, Suction available and Patient being monitored Patient Re-evaluated:Patient Re-evaluated prior to induction Oxygen Delivery Method: Circle system utilized Preoxygenation: Pre-oxygenation with 100% oxygen Induction Type: IV induction Ventilation: Two handed mask ventilation required Laryngoscope Size: Mac and 3 Grade View: Grade III Tube type: Oral Tube size: 7.5 mm Number of attempts: 1 Airway Equipment and Method: Stylet Placement Confirmation: ETT inserted through vocal cords under direct vision,  positive ETCO2 and breath sounds checked- equal and bilateral Secured at: 22 cm Tube secured with: Tape Dental Injury: Teeth and Oropharynx as per pre-operative assessment  Comments: Loanne Drilling, SRNA intubated patient. AOI

## 2019-05-18 NOTE — Discharge Instructions (Signed)
° °Kevin M. Supple, M.D., F.A.A.O.S. °Orthopaedic Surgery °Specializing in Arthroscopic and Reconstructive °Surgery of the Shoulder °[phone] °[address] - [place], [place] [address] - Fax [phone] ° ° °POST-OP TOTAL SHOULDER REPLACEMENT INSTRUCTIONS ° °1. Call the office at [phone] to schedule your first post-op appointment 10-14 days from the date of your surgery. ° °2. The bandage over your incision is waterproof. You may begin showering with this dressing on. You may leave this dressing on until first follow up appointment within 2 weeks. We prefer you leave this dressing in place until follow up however after 5-7 days if you are having itching or skin irritation and would like to remove it you may do so. Go slow and tug at the borders gently to break the bond the dressing has with the skin. At this point if there is no drainage it is okay to go without a bandage or you may cover it with a light guaze and tape. You can also expect significant bruising around your shoulder that will drift down your arm and into your chest wall. This is very normal and should resolve over several days. ° ° 3. Wear your sling/immobilizer at all times except to perform the exercises below or to occasionally let your arm dangle by your side to stretch your elbow. You also need to sleep in your sling immobilizer until instructed otherwise. It is ok to remove your sling if you are sitting in a controlled environment and allow your arm to rest in a position of comfort by your side or on your lap with pillows to give your neck and skin a break from the sling. You may remove it to allow arm to dangle by side to shower. If you are up walking around and when you go to sleep at night you need to wear it. ° °4. Range of motion to your elbow, wrist, and hand are encouraged 3-5 times daily. Exercise to your hand and fingers helps to reduce swelling you may experience. ° °5. Utilize ice to the shoulder 3-5 times  minimum a day and additionally if you are experiencing pain. ° °6. Prescriptions for a pain medication and a muscle relaxant are provided for you. It is recommended that if you are experiencing pain that you pain medication alone is not controlling, add the muscle relaxant along with the pain medication which can give additional pain relief. The first 1-2 days is generally the most severe of your pain and then should gradually decrease. As your pain lessens it is recommended that you decrease your use of the pain medications to an "as needed basis'" only and to always comply with the recommended dosages of the pain medications. ° °7. Pain medications can produce constipation along with their use. If you experience this, the use of an over the counter stool softener or laxative daily is recommended.  ° °8. For additional questions or concerns, please do not hesitate to call the office. If after hours there is an answering service to forward your concerns to the physician on call. ° °9.Pain control following an exparel block ° °To help control your post-operative pain you received a nerve block  performed with Exparel which is a long acting anesthetic (numbing agent) which can provide pain relief and sensations of numbness (and relief of pain) in the operative shoulder and arm for up to 3 days. Sometimes it provides mixed relief, meaning you may still have numbness in certain areas of the arm but can still   be able to move  parts of that arm, hand, and fingers. We recommend that your prescribed pain medications  be used as needed. We do not feel it is necessary to "pre medicate" and "stay ahead" of pain.  Taking narcotic pain medications when you are not having any pain can lead to unnecessary and potentially dangerous side effects.    10. Use the ice machine as much as possible in the first 5-7 days from surgery, then you can wean its use to as needed. The ice typically needs to be replaced every 6 hours, instead of  ice you can actually freeze water bottles to put in the cooler and then fill water around them to avoid having to purchase ice. You can have spare water bottles freezing to allow you to rotate them once they have melted. Try to have a thin shirt or light cloth or towel under the ice wrap to protect your skin.   11.  We recommend that you avoid any dental work or cleaning in the first 3 months following your joint replacement. This is to help minimize the possibility of infection from the bacteria in your mouth that enters your bloodstream during dental work. We also recommend that you take an antibiotic prior to your dental work for the first year after your shoulder replacement to further help reduce that risk. Please simply contact our office for antibiotics to be sent to your pharmacy prior to dental work.  POST-OP EXERCISES  Pendulum Exercises  Perform pendulum exercises while standing and bending at the waist. Support your uninvolved arm on a table or chair and allow your operated arm to hang freely. Make sure to do these exercises passively - not using you shoulder muscles. These exercises can be performed once your nerve block effects have worn off.  Repeat 20 times. Do 3 sessions per day.

## 2019-05-18 NOTE — H&P (Signed)
William Mason    Chief Complaint: Left shoulder osteoarthritis HPI: The patient is a 58 y.o. male with and stage left shoulder osteoarthritis and progressively increasing pain and functional limitations  Past Medical History:  Diagnosis Date  . Allergy   . Anxiety   . Arthritis    "knees, shoulders, back" (04/27/2017)  . Carotid artery stenosis   . Chronic back pain    "5 ruptured discs in the middle; 1 ruptured disc in the lower" (04/27/2017)  . Coronary artery disease    2 cardiac stents  oct 2018  . Depression   . Diverticulitis   . Exertional heat stroke ~ 01/2017  . Family history of adverse reaction to anesthesia    "mom had emergency appy at age 59; she had severe short term memory loss; never recovered"  . GERD (gastroesophageal reflux disease)   . Glaucoma    laser treated   narrow angle  . History of hiatal hernia   . HLD (hyperlipidemia) 02/01/2019  . Hyperlipidemia   . Hypertension   . OSA on CPAP   . PVC (premature ventricular contraction)   . Sleep apnea     Past Surgical History:  Procedure Laterality Date  . CORONARY ANGIOPLASTY WITH STENT PLACEMENT  04/27/2017   x2   . CORONARY STENT INTERVENTION N/A 04/27/2017   Procedure: CORONARY STENT INTERVENTION;  Surgeon: Nigel Mormon, MD;  Location: [facility];  Service: Cardiovascular;  Laterality: N/A;  . LEFT HEART CATH AND CORONARY ANGIOGRAPHY N/A 04/27/2017   Procedure: LEFT HEART CATH AND CORONARY ANGIOGRAPHY;  Surgeon: Nigel Mormon, MD;  Location: [facility];  Service: Cardiovascular;  Laterality: N/A;  . SHOULDER SURGERY Left ~ 1992   "tendon relocation; cut me open to do it"  . SHOULDER SURGERY Right 08/04/2018  . SURGERY SCROTAL / TESTICULAR Left ~ 1980   "cyst removed"  . TONSILLECTOMY      Family History  Problem Relation Age of Onset  . Breast cancer Maternal Grandmother   . Leukemia Paternal Grandfather   . COPD Father   . Heart disease Father   . Heart disease  Mother   . Hypertension Mother   . Colon cancer Neg Hx   . Esophageal cancer Neg Hx   . Rectal cancer Neg Hx   . Stomach cancer Neg Hx     Social History:  reports that he has never smoked. He quit smokeless tobacco use about 40 years ago.  His smokeless tobacco use included chew. He reports previous alcohol use. He reports that he does not use drugs.   Medications Prior to Admission  Medication Sig Dispense Refill  . aspirin 81 MG chewable tablet Chew 1 tablet (81 mg total) by mouth daily. 90 tablet 6  . cetirizine (ZYRTEC) 10 MG tablet Take 10 mg by mouth daily.     . diazepam (VALIUM) 5 MG tablet Take 5 mg by mouth 2 (two) times daily as needed for muscle spasms.     . DULoxetine (CYMBALTA) 30 MG capsule Take 30 mg by mouth 2 (two) times daily.     Marland Kitchen ezetimibe (ZETIA) 10 MG tablet Take 10 mg by mouth daily.    . fluticasone (FLONASE) 50 MCG/ACT nasal spray Place 1 spray into both nostrils at bedtime.    . metaxalone (SKELAXIN) 800 MG tablet Take 800 mg by mouth daily as needed for muscle spasms.     . methocarbamol (ROBAXIN) 500 MG tablet Take 500 mg by mouth  every 8 (eight) hours as needed for muscle spasms.     . metoprolol succinate (TOPROL-XL) 25 MG 24 hr tablet Take 1.5 tablets (37.5 mg total) by mouth daily. 90 tablet 3  . nitroGLYCERIN (NITROSTAT) 0.4 MG SL tablet Place 1 tablet (0.4 mg total) under the tongue every 5 (five) minutes as needed for chest pain. 90 tablet 3  . oxyCODONE-acetaminophen (PERCOCET/ROXICET) 5-325 MG tablet Take 1 tablet by mouth every 6 (six) hours as needed for severe pain.    . pantoprazole (PROTONIX) 40 MG tablet Take 1 tablet (40 mg total) by mouth daily. 30 tablet 6  . Polyethyl Glycol-Propyl Glycol (SYSTANE ULTRA) 0.4-0.3 % SOLN Place 1-2 drops into both eyes daily as needed (eye irritation).    . rosuvastatin (CRESTOR) 20 MG tablet Take 1 tablet (20 mg total) by mouth at bedtime. 30 tablet 11     Physical Exam: Left shoulder demonstrates painful  and guarded motion as noted at recent office visit.  Overall strength is well-maintained.  Recent x-rays confirm complete loss of joint space with subchondral sclerosis and peripheral osteophyte formation.  There is a retained metallic anchor from a previous stabilization procedure.  Vitals  Temp:  [99.4 F (37.4 C)] 99.4 F (37.4 C) (10/29 [id number]) Pulse Rate:  [70] 70 (10/29 0613) Resp:  [18] 18 (10/29 0613) BP: (143)/(92) 143/92 (10/29 0613) SpO2:  [97 %] 97 % (10/29 [id number])  Assessment/Plan  Impression: Left shoulder osteoarthritis  Plan of Action: Procedure(s): Left Shoulder hardware removal, Anatomic shoulder arthroplasty, possible Reverse Arthroplasty  Chevy Virgo M Koralyn Prestage 05/18/2019, 6:52 AM Contact # [phone]

## 2019-05-18 NOTE — Anesthesia Postprocedure Evaluation (Signed)
Anesthesia Post Note  Patient: William Mason  Procedure(s) Performed: Left Shoulder hardware removal with Anatomic shoulder arthroplasty (Left Shoulder)     Patient location during evaluation: PACU Anesthesia Type: General Level of consciousness: awake and alert and oriented Pain management: pain level controlled Vital Signs Assessment: post-procedure vital signs reviewed and stable Respiratory status: spontaneous breathing, nonlabored ventilation and respiratory function stable Cardiovascular status: blood pressure returned to baseline and stable Postop Assessment: no apparent nausea or vomiting Anesthetic complications: no    Last Vitals:  Vitals:   05/18/19 1100 05/18/19 1115  BP: 139/79   Pulse: (!) 58 73  Resp: 15 12  Temp:    SpO2: 100% 97%    Last Pain:  Vitals:   05/18/19 1115  TempSrc:   PainSc: Asleep                 Nicholis Stepanek A.

## 2019-05-18 NOTE — Op Note (Signed)
05/18/2019  9:59 AM  PATIENT:   William Mason  58 y.o. male  PRE-OPERATIVE DIAGNOSIS:  Left shoulder osteoarthritis with remote history of anterior open stabilization procedure for recurrent shoulder instability and now with retained hardware  POST-OPERATIVE DIAGNOSIS: Same  PROCEDURE:  1.  Left shoulder anatomic total shoulder arthroplasty utilizing a size medium cemented glenoid, size 6 Arthrex stem with a 45/17 concentric humeral head  2.  Removal of retained metallic staple from the left humeral metaphyseal region  SURGEON:  Tiron Suski, Metta Clines. M.D.  ASSISTANTS: Jenetta Loges, PA-C  ANESTHESIA:   General endotracheal and interscalene block with Exparel  EBL: 200 cc  SPECIMEN: None  Drains: None   PATIENT DISPOSITION:  PACU - hemodynamically stable.    PLAN OF CARE: Admit for overnight observation  Brief history:  William Mason is a 58 year old gentleman with a remote history of recurrent left shoulder instability status post a previous open anterior reconstruction who has now progressed to end stage osteoarthritis with retained hardware.  Due to his increasing pain and functional limitations he is brought to the operating this time for planned left total shoulder arthroplasty versus possible reverse arthroplasty depending on intraoperative findings.  Preoperatively had counseled William Mason regarding treatment options as well as the potential risks versus benefits thereof.  Possible surgical complications were reviewed including bleeding, infection, neurovascular injury, persistent pain, loss of motion, anesthetic complication, failure of the implant, and possible need for additional surgery.  We also discussed the rationale for selecting anatomic versus reverse arthroplasty.  He understands, and accepts, and agrees with our planned procedure.  Procedure in detail:  After undergoing routine preop evaluation patient received prophylactic antibiotics and interscalene block with  Exparel was established in the holding area by the anesthesia department.  Placed supine on the operating table and underwent smooth induction of a general endotracheal anesthesia.  Placed into the beachchair position and appropriately padded and protected.  The left shoulder girdle region was sterilely prepped and draped in standard fashion.  Timeout was called.  An anterior deltopectoral approach was made through an approximate 10 cm incision.  This incision was lateral to the previous anterior axillary incision.  Dissection carried deeply and electrocautery was used for hemostasis.  Skin flaps were elevated the deltopectoral interval was then developed from proximal to distal with the vein taken laterally although the vein was quite vestigial with some varicosities and showed changes related to the prior surgery with some decreased caliber and again multiple varicosities.  The upper centimeter of the pectoralis major was tenotomized to improve exposure.  Dissection carried beneath the deltoid and this allowed [place] to identify the retained metallic staple.  This was dissected free and an osteotome was used to carefully mobilized the staple and it was then removed with a vice grip.  We then identified the bicipital groove which was just medial to the retained staple that we had removed.  The bicep was then tenodesed at the upper border of the pectoralis major tendon the proximal segment was then unroofed and excised.  We then divided the rotator cuff along the rotator interval and carefully outlined the insertions of the subscapularis to the superior and inferior aspects of the lesser tuberosity and it is unclear whether there may have been some type of osteotomy procedure or perhaps tenotomy related to the prior stabilization procedure.  There did appear to be a solid insertion of the subscap into the lesser tuberosity and so we proceeded with a lesser tuberosity osteotomy using our  standard technique taken wafer of  bone then tagging and mobilizing the subscap medially.  Capsular attachments were then divided from the anterior and inferior margins of the humeral neck allowing deliver the humeral head through the wound.  There was some deficiency of the most anterosuperior aspect of the rotator cuff which were carefully protected and found that the posterior elements of the rotator cuff were solid with good caliber.  Given these findings we elected to consider the anatomic replacement.  Extra medullary guide was then used to perform our humeral head resection at approximate 20 degrees retroversion matching the native retroversion and then used a rondure to remove the osteophytes from the margin of the humeral neck.  There was some chronic deformity of the humeral head posterosuperiorly related to the previous Hill-Sachs defect.  The humeral canal was then prepared hand reaming and then broaching to a size 6 showing solid fixation.  A size 5 trial with metal cap was then placed over the cut proximal humeral surface and then at this point we exposed the glenoid with combination of Fukuda pitchfork and stick tongue retractors.  A circumferential labral resection was then completed.  Guidepin directed into the center of the glenoid we selected a medium glenoid glenoid was then reamed and then peripheral osteophytes from the inferior margin of the glenoid were removed with a rondure.  Terminal preparation of the glenoid was completed with the central drill and the superior and inferior peg and slot respectively and glenoid was then broached and trial showed excellent fit.  At this point the cement was mixed introduced in the superior and inferior peg and slot respectively and are final glenoid was impacted after we had applied morselized bone graft around the central peg.  We are very pleased with the overall stability of the glenoid.  This point we returned our attention to the proximal humerus where passing sutures were placed  through the humeral metaphysis one medial and to lateral to the lesser tuberosity osteotomy site for use in our repair of the LTO.  Our stem was then threaded with our LTO repair sutures these were then shuttled through the bone tunnels in the proximal humerus and the second size 6 stem was then seated and the proximal locking screws were tightened.  At this time we made a series of trial reductions and ultimately found that the 45 x 17 head gave [place] the best soft tissue balance with good stability good motion.  The final 45 x 17 concentric head was impacted the final reduction was then performed and again very pleased with the overall position stability and soft tissue balance.  At this point the LTO was repaired back to the humeral metaphysis utilizing our previously placed sutures using our standard suture construct with 2 horizontal and 2 crossing sutures across the bone fragment which allowed excellent stability and fixation.  We then repaired the rotator cuff along the rotator interval with a series of figure-of-eight suture tape sutures.  Upon completion I was very pleased with the stability of the repair and excellent motion with easily achieving 45 degrees of external rotation without excessive tension on the LTO.  Wound was then copiously irrigated.  The deltopectoral interval was closed with a series of figure-of-eight and 1 Vicryl sutures.  2-0 Vicryl used with subcu layer and intracuticular 3 Monocryl for the skin followed by Dermabond Aquasol dressing and left arm was then placed in a sling the patient was awakened, extubated, and taken recovery in stable condition.  Jenetta Loges, PA-C was used as an Environmental consultant throughout this case essential for help with positioning of the patient, positioning of the extremity, implantation the prosthesis, wound closure, and intraoperative decision-making.  Metta Clines Vondell Babers MD   Contact # [phone]

## 2019-05-18 NOTE — Anesthesia Procedure Notes (Addendum)
Anesthesia Regional Block: Interscalene brachial plexus block   Pre-Anesthetic Checklist: ,, timeout performed, Correct Patient, Correct Site, Correct Laterality, Correct Procedure, Correct Position, site marked, Risks and benefits discussed,  Surgical consent,  Pre-op evaluation,  At surgeon's request and post-op pain management  Laterality: Left  Prep: chloraprep       Needles:  Injection technique: Single-shot  Needle Type: Echogenic Stimulator Needle     Needle Length: 9cm  Needle Gauge: 21   Needle insertion depth: 6 cm   Additional Needles:   Procedures:,,,, ultrasound used (permanent image in chart),,,,  Narrative:  Start time: 05/18/2019 6:57 AM End time: 05/18/2019 7:02 AM Injection made incrementally with aspirations every 5 mL.  Performed by: Personally  Anesthesiologist: Josephine Igo, MD  Additional Notes: Timeout performed. Patient sedated. Relevant anatomy ID'd using [place]. Incremental 2-65ml injection of LA with frequent aspiration. Patient tolerated procedure well.        Left interscalene Block

## 2019-05-19 ENCOUNTER — Encounter (HOSPITAL_COMMUNITY): Payer: Self-pay | Admitting: Orthopedic Surgery

## 2019-05-19 DIAGNOSIS — M19012 Primary osteoarthritis, left shoulder: Secondary | ICD-10-CM | POA: Diagnosis not present

## 2019-05-19 MED ORDER — OXYCODONE-ACETAMINOPHEN 5-325 MG PO TABS: 1.0000 | ORAL_TABLET | ORAL | 0 refills | Status: AC | PRN

## 2019-05-19 MED ORDER — ONDANSETRON HCL 4 MG PO TABS: 4.0000 mg | ORAL_TABLET | Freq: Three times a day (TID) | ORAL | 0 refills | Status: DC | PRN

## 2019-05-19 MED ORDER — METHOCARBAMOL 500 MG PO TABS: 500.0000 mg | ORAL_TABLET | Freq: Three times a day (TID) | ORAL | 1 refills | Status: DC | PRN

## 2019-05-19 NOTE — Discharge Summary (Signed)
PATIENT ID:      William Mason  MRN:     [id number] DOB/AGE:    11/11/1960 / 58 y.o.     DISCHARGE SUMMARY  ADMISSION DATE:    05/18/2019 DISCHARGE DATE:    ADMISSION DIAGNOSIS: Left shoulder osteoarthritis Past Medical History:  Diagnosis Date  . Allergy   . Anxiety   . Arthritis    "knees, shoulders, back" (04/27/2017)  . Carotid artery stenosis   . Chronic back pain    "5 ruptured discs in the middle; 1 ruptured disc in the lower" (04/27/2017)  . Coronary artery disease    2 cardiac stents  oct 2018  . Depression   . Diverticulitis   . Exertional heat stroke ~ 01/2017  . Family history of adverse reaction to anesthesia    "mom had emergency appy at age 70; she had severe short term memory loss; never recovered"  . GERD (gastroesophageal reflux disease)   . Glaucoma    laser treated   narrow angle  . History of hiatal hernia   . HLD (hyperlipidemia) 02/01/2019  . Hyperlipidemia   . Hypertension   . OSA on CPAP   . PVC (premature ventricular contraction)   . Sleep apnea     DISCHARGE DIAGNOSIS:   Active Problems:   Status post total shoulder arthroplasty, left   PROCEDURE: Procedure(s): Left Shoulder hardware removal with Anatomic shoulder arthroplasty on 05/18/2019  CONSULTS:    HISTORY:  See H&P in chart.  HOSPITAL COURSE:  Kayode Kornfeld is a 58 y.o. admitted on 05/18/2019 with a diagnosis of Left shoulder osteoarthritis.  They were brought to the operating room on 05/18/2019 and underwent Procedure(s): Left Shoulder hardware removal with Anatomic shoulder arthroplasty.    They were given perioperative antibiotics:  Anti-infectives (From admission, onward)   Start     Dose/Rate Route Frequency Ordered Stop   05/18/19 0600  ceFAZolin (ANCEF) IVPB 2g/100 mL premix     2 g 200 mL/hr over 30 Minutes Intravenous On call to O.R. 05/18/19 0532 05/18/19 0808   05/18/19 0600  vancomycin (VANCOCIN) IVPB 1000 mg/200 mL premix     1,000 mg 200 mL/hr over 60 Minutes  Intravenous 60 min pre-op 05/18/19 0532 05/18/19 0738    .  Patient underwent the above named procedure and tolerated it well. The following day they were hemodynamically stable and pain was controlled on oral analgesics. They were neurovascularly intact to the operative extremity. OT was ordered and worked with patient per protocol. They were medically and orthopaedically stable for discharge on .    DIAGNOSTIC STUDIES:  RECENT RADIOGRAPHIC STUDIES :  No results found.  RECENT VITAL SIGNS:   Patient Vitals for the past 24 hrs:  BP Temp Temp src Pulse Resp SpO2 Height Weight  05/19/19 0525 122/71 97.6 F (36.4 C) Oral 71 14 98 % - -  05/19/19 0136 127/68 97.6 F (36.4 C) Oral 72 18 97 % - -  05/18/19 2009 117/67 98.4 F (36.9 C) Oral 78 14 96 % - -  05/18/19 1432 (!) 141/65 98.3 F (36.8 C) - 66 16 92 % - -  05/18/19 1333 139/73 98 F (36.7 C) - 68 16 97 % - -  05/18/19 1253 126/65 97.6 F (36.4 C) - 66 16 97 % - -  05/18/19 1130 124/75 97.6 F (36.4 C) Oral 74 16 96 % 5' 10.98" (1.803 m) 98.8 kg  05/18/19 1115 125/82 98.7 F (37.1 C) - 73 12 97 % - -  05/18/19 1100 139/79 - - (!) 58 15 100 % - -  05/18/19 1045 137/79 - - 71 14 98 % - -  05/18/19 1030 139/75 - - 68 12 100 % - -  05/18/19 1015 (!) 145/78 98.4 F (36.9 C) - 74 16 100 % - -  .  RECENT EKG RESULTS:    Orders placed or performed in visit on 02/07/19  . EKG    DISCHARGE INSTRUCTIONS:    DISCHARGE MEDICATIONS:   Allergies as of 05/19/2019      Reactions   Vicodin [hydrocodone-acetaminophen] Nausea Only      Medication List    STOP taking these medications   diazepam 5 MG tablet Commonly known as: VALIUM   Skelaxin 800 MG tablet Generic drug: metaxalone     TAKE these medications   aspirin 81 MG chewable tablet Chew 1 tablet (81 mg total) by mouth daily.   cetirizine 10 MG tablet Commonly known as: ZYRTEC Take 10 mg by mouth daily.   DULoxetine 30 MG capsule Commonly known as:  CYMBALTA Take 30 mg by mouth 2 (two) times daily.   ezetimibe 10 MG tablet Commonly known as: ZETIA Take 10 mg by mouth daily.   fluticasone 50 MCG/ACT nasal spray Commonly known as: FLONASE Place 1 spray into both nostrils at bedtime.   methocarbamol 500 MG tablet Commonly known as: Robaxin Take 1 tablet (500 mg total) by mouth 3 (three) times daily as needed for muscle spasms. What changed: when to take this   metoprolol succinate 25 MG 24 hr tablet Commonly known as: TOPROL-XL Take 1.5 tablets (37.5 mg total) by mouth daily.   nitroGLYCERIN 0.4 MG SL tablet Commonly known as: NITROSTAT Place 1 tablet (0.4 mg total) under the tongue every 5 (five) minutes as needed for chest pain.   ondansetron 4 MG tablet Commonly known as: Zofran Take 1 tablet (4 mg total) by mouth every 8 (eight) hours as needed for nausea or vomiting.   oxyCODONE-acetaminophen 5-325 MG tablet Commonly known as: Percocet Take 1 tablet by mouth every 4 (four) hours as needed (max 6 q). What changed:   when to take this  reasons to take this   pantoprazole 40 MG tablet Commonly known as: PROTONIX Take 1 tablet (40 mg total) by mouth daily.   rosuvastatin 20 MG tablet Commonly known as: Crestor Take 1 tablet (20 mg total) by mouth at bedtime.   Systane Ultra 0.4-0.3 % Soln Generic drug: Polyethyl Glycol-Propyl Glycol Place 1-2 drops into both eyes daily as needed (eye irritation).       FOLLOW UP VISIT:   Follow-up Information    Justice Britain, MD.   Specialty: Orthopedic Surgery Why: call to be seen in 10-14 days Contact information: [address] [place] [address] [phone]           DISCHARGE TO: Home   DISCHARGE CONDITION:  Thereasa Parkin Ifeanyichukwu Wickham for Dr. Justice Britain 05/19/2019, 8:26 AM

## 2019-05-19 NOTE — Evaluation (Signed)
Occupational Therapy Evaluation Patient Details Name: William Mason MRN: [id number] DOB: September 11, 1960 Today's Date: 05/19/2019    History of Present Illness s/p L TSA, h/o multiple shoulder sxs   Clinical Impression   This 58 year old man was admitted for the above sx.  At baseline, he is independent. Pt reports that he recently had R shoulder sx and therapy stopped when COVID hit.  He reports that R elbow is hurting today.  All education was completed. He will follow up with Dr supple for further rehab needs    Follow Up Recommendations  Follow surgeon's recommendation for DC plan and follow-up therapies;Supervision/Assistance - 24 hour    Equipment Recommendations  None recommended by OT    Recommendations for Other Services       Precautions / Restrictions Precautions Precautions: Shoulder Type of Shoulder Precautions: may come out of sling in controlled environment.  May use arm passively during adls within the following parameters:  20 ER, 45 ABD, and 60 FF.  May perform gentle pendulums Shoulder Interventions: Shoulder sling/immobilizer Precaution Booklet Issued: Yes (comment) Restrictions Weight Bearing Restrictions: Yes LUE Weight Bearing: Non weight bearing      Mobility Bed Mobility               General bed mobility comments: supervision  Transfers                 General transfer comment: min guard to supervision, improved with time    Balance                                           ADL either performed or assessed with clinical judgement   ADL Overall ADL's : Needs assistance/impaired Eating/Feeding: Set up   Grooming: Set up   Upper Body Bathing: Moderate assistance   Lower Body Bathing: Maximal assistance   Upper Body Dressing : Moderate assistance;Maximal assistance   Lower Body Dressing: Maximal assistance   Toilet Transfer: Min guard             General ADL Comments: performed ADL, walked around unit  and educated on exercises. Pt did not perform pendulums--he is familiar with them.  Educated to make sure they are done passively.  Pt initially a little unsteady when walking without LOB. This improved as he walked more.  This was pt's first time out of bed since sx     Vision         Perception     Praxis      Pertinent Vitals/Pain Pain Assessment: Faces Faces Pain Scale: Hurts a little bit;  Pain Location: LUE and R elbow Pain Descriptors / Indicators: Numbness;Tingling Pain Intervention(s): Limited activity within patient's tolerance;Monitored during session;Repositioned     Hand Dominance Right   Extremity/Trunk Assessment Upper Extremity Assessment Upper Extremity Assessment: LUE deficits/detail(immobilized, can partially move fingers)           Communication Communication Communication: No difficulties   Cognition Arousal/Alertness: Awake/alert Behavior During Therapy: WFL for tasks assessed/performed Overall Cognitive Status: Within Functional Limits for tasks assessed                                     General Comments       Exercises     Shoulder Instructions      Home  Living Family/patient expects to be discharged to:: Private residence Living Arrangements: Spouse/significant other                 Bathroom Shower/Tub: Occupational psychologist: Handicapped height                Prior Functioning/Environment Level of Independence: Independent                 OT Problem List:        OT Treatment/Interventions:      OT Goals(Current goals can be found in the care plan section) Acute Rehab OT Goals OT Goal Formulation: All assessment and education complete, DC therapy  OT Frequency:     Barriers to D/C:            Co-evaluation              AM-PAC OT "6 Clicks" Daily Activity     Outcome Measure Help from another person eating meals?: A Little Help from another person taking care of  personal grooming?: A Little Help from another person toileting, which includes using toliet, bedpan, or urinal?: A Little Help from another person bathing (including washing, rinsing, drying)?: A Lot Help from another person to put on and taking off regular upper body clothing?: A Lot Help from another person to put on and taking off regular lower body clothing?: A Lot 6 Click Score: 15   End of Session    Activity Tolerance: Patient tolerated treatment well Patient left: in chair;with call bell/phone within reach  OT Visit Diagnosis: Muscle weakness (generalized) (M62.81);Pain Pain - Right/Left: Left Pain - part of body: Shoulder                Time: [id number] OT Time Calculation (min): 43 min Charges:  OT General Charges $OT Visit: 1 Visit OT Evaluation $OT Eval Low Complexity: 1 Low OT Treatments $Self Care/Home Management : 8-22 mins $Therapeutic Activity: 8-22 mins  William Mason, OTR/L Acute Rehabilitation Services [phone] [facility] pager [phone] office 05/19/2019  William Mason 05/19/2019, 9:57 AM

## 2019-05-19 NOTE — Progress Notes (Signed)
Pt d/c to home. PIV removed. Reviewed AVS with pt and pt confirmed understanding with teachback method. Pt escorted off of unit to car and care of wife.

## 2019-06-05 ENCOUNTER — Other Ambulatory Visit: Payer: Self-pay | Admitting: Cardiology

## 2019-06-05 DIAGNOSIS — R9439 Abnormal result of other cardiovascular function study: Secondary | ICD-10-CM

## 2019-06-07 ENCOUNTER — Ambulatory Visit (INDEPENDENT_AMBULATORY_CARE_PROVIDER_SITE_OTHER): Payer: Managed Care, Other (non HMO) | Admitting: Neurology

## 2019-06-07 ENCOUNTER — Other Ambulatory Visit: Payer: Self-pay

## 2019-06-07 ENCOUNTER — Encounter: Payer: Self-pay | Admitting: Neurology

## 2019-06-07 ENCOUNTER — Encounter

## 2019-06-07 VITALS — BP 117/69 | HR 66 | Temp 97.4°F | Ht 71.0 in | Wt 211.0 lb

## 2019-06-07 DIAGNOSIS — G4733 Obstructive sleep apnea (adult) (pediatric): Secondary | ICD-10-CM

## 2019-06-07 DIAGNOSIS — Z9989 Dependence on other enabling machines and devices: Secondary | ICD-10-CM

## 2019-06-07 DIAGNOSIS — G4719 Other hypersomnia: Secondary | ICD-10-CM

## 2019-06-07 DIAGNOSIS — G4731 Primary central sleep apnea: Secondary | ICD-10-CM

## 2019-06-07 DIAGNOSIS — F1190 Opioid use, unspecified, uncomplicated: Secondary | ICD-10-CM

## 2019-06-07 DIAGNOSIS — I25118 Atherosclerotic heart disease of native coronary artery with other forms of angina pectoris: Secondary | ICD-10-CM | POA: Diagnosis not present

## 2019-06-07 DIAGNOSIS — G4737 Central sleep apnea in conditions classified elsewhere: Secondary | ICD-10-CM

## 2019-06-07 DIAGNOSIS — R9439 Abnormal result of other cardiovascular function study: Secondary | ICD-10-CM | POA: Diagnosis not present

## 2019-06-07 NOTE — Progress Notes (Signed)
SLEEP MEDICINE CLINIC   Provider:  Larey Seat, MD   Primary Care Physician:  Jani Gravel, MD   Referring Provider: Jani Gravel, MD   HPI:  Roberto Romanoski is a 58 y.o. male patient, here for a Rv.  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 He tested negaive for COVID. Still wearing a sling as he did in January during our last visit, this time left arm !  He continues to be very compliant with his CPAP use and use the machine 29 out of 30 days with the last day being 16 November 2 days ago.  His average user time is 5 hours and 45 minutes, compliance by hours is 87%.  His AutoSet has a pressure window between 5 cmH2O and 12 cmH2O.  He also has a 3 cm expiratory pressure relief setting.  His residual AHI is only 2.6 which is very low and his 95th percentile pressure is 9.6.  There needs to be no adjustment made with his current settings he does have some air leakage but this is also explained by facial hair. PS:  He used the machine in hospital for 2 nights, and he reports he has still breakthrough apnea when in supine. His shoulders have been making it difficult to sleep laterally. I can adjust and raise the upper pressure window for this special time. He also has limited dexterity and cannot always switch the machine off, often just removed the mask. This leads to additional erroneous counts of apnea and air leak.     PATIENT IS now here for a revisit- 08-17-2018, he is presenting today with a sling around his arm, he is using this to support the right elbow.  He had shoulder surgery on January 16 of this year.  He continues to use his CPAP very compliantly 30 out of 30 days past but with the onset of the shoulder surgery he was up more often at night and has not been using it 4 hours consecutive every night.  However downloads from November and October confirmed that he was 97% compliant at that time.   He uses an AutoSet between 5  and 12 cmH2O with 3 cm EPR, his residual AHI is 4.2 and his average user time for this last month was 5 hours 8 minutes.  He has not slept well since his surgery due to post surgical pain-in the past 2 weeks since surgery he often has only slept between 2 and 4 hours and that is his fragmentation.  Fatigue: Is at 27 points, Epworth sleepiness score endorsed at 11 points again this is an unusual situation and may not reflect his normal sleepiness. He fel asleep in my waiting room.      02-10-2018, he is reporting excessive daytime sleepiness while using his old CPAP compliantly- We no longer have the software- last download was 5 years ago, according to patient., [facility].   Mr. Finigan was seen October 2018  here as in a referral  from Dr. Maudie Mercury for a sleep apnea re- evaluation.  Mr. Kerstein was last seen in October 2018 when he underwent a HST sleep study to confirm that he still had apnea and of what kind.  The sleep study returned positive AHI of 13.2/h and a CPAP order had been issued, but the patient learned that also he had met his out-of-pocket cost and deductible the insurance company did not want to cover a  new CPAP.  They actually asked the patient to return to the machine for 1 year which is these days a very common request, but would have added cost to the patient. He is now in need of a new CPSAP, uses compliantly his old machine. It's not downloadable. He is more fatigued.  Epworth 16/ 24. Had recenlty a heat stroke. See last years HST below. CD    NAME:  Ayub Kirsh                                                             DOB: 1961/03/05 MEDICAL RECORD NUMBER [id number]                                         DOS: 05/19/2017 REFERRING PHYSICIAN: Jani Gravel, M.D.  STUDY PERFORMED: Home Sleep Study HISTORY:   58 year old male patient of Dr. Noel Journey, who tested positive for OSA 2011.  He was placed on CPAP at that time. Pt has not been followed up with since.  Pt is in need of new  machine and equipment. BMI 31.2. Epworth Sleepiness Score endorsed at 15/24 points, FSS 20 points,         STUDY RESULTS: Total Recording Time:  8 hours, 37 minutes Total Apnea/Hypopnea Index (AHI):   13.2 /hour and RDI was 15.6 /hr. Average Oxygen Saturation SpO2 at 91%; Lowest Oxygen Saturation 80 %  Total Time in Oxygen Saturation Below 89%: 66 minutes= 13 % Average Heart Rate: 71 bpm (60 - 113 bpm)  IMPRESSION: Mild and mainly obstructive sleep apnea with prolonged desaturation time- this constellation warrants CPAP therapy.  RECOMMENDATION: Auto-titrating CPAP with a window of 5-12 cm water pressure, with mask of choice, heated humidity. I certify that I have reviewed the raw data recording prior to the issuance of this report in accordance with the standards of the American Academy of Sleep Medicine (AASM). Larey Seat, M.D.     05-21-2017    Mr. Shad is a 58 year old right-handed Caucasian patient of Dr. Maudie Mercury. He remembers a sleep study taking place about 7 years ago at [facility] on [address] in the basement of Dr. Julianne Rice office. He was then placed on CPAP and has used it ever since. He still uses his first machine with the same settings that were prescribed in 2011. In the meantime he has sometimes lost weight and sometimes gained weight, he may have been as heavy as 220 pounds at the time of his sleep study. He also remembers that changes in his body mass index seemed to not correlate with changes in his apnea frequency or his need for CPAP. Dr. Maudie Mercury initiated a consultation with Dr. Vinetta Bergamo, an otolaryngologist at [place]. Dr. Vinetta Bergamo was fascinated with the epiglottal anatomy of the patient. However no specific treatment could be initiated or resulted. Dr. Vinetta Bergamo discouraged at the time a UPPP.  Mr. Wilden is here today because he hasn't had follow-up for his CPAP needs, does not know if he has a certain degree of apnea or if he is perhaps even apnea free.  He  reportedly has used his CPAP compliantly ever since it was issued and when he doesn't use  CPAP (during an unscheduled nap for example), he feels that his sleep is not is refreshing and restorative.  Mr. Stauffer is most recent encounter with Dr. Maudie Mercury was related to a syncope. It is presumed that he had a heat stroke. Extensive laboratory testing showed elevated transaminases, elevated monocytes, elevated hematocrit. I do not think that he was dehydrated as his BUN was 14.2. Glucose was normal. There was no evidence of a silent myocardial infarction a CT of the head without contrast was negative, carotid duplex was normal, cardiac stress test by treadmill was abnormal. His  echocardiograms is pending .  Sleep habits are as follows: Last hour before bedtime : feeds the cats, and watches TV in the living room, goes to bed at about 11 Pm, his wife is already in bed by 9 PM.The patient has at least one bathroom break usually in the early morning hours( 3-4 AM) and can go back to sleep after that. He is using CPAP every night. He averages at least 7 hours of nocturnal sleep. He does not report any sleep choking, palpitations, diaphoresis at night. Over the last year he has developed very vivid sometimes bizarre dreams, lucid dreams.. These include encounters with now deceased family members etc.and  he has not enacted dreams. He gets up at 6.30.   Sleep medical history and family sleep history: single child, both parents snored. Mother has dementia. Tonsillectomy at age 28-5 , and nasal septoplasty at age 56 and broke nose again in high school .  Social history: he works outdoors, Biomedical scientist. married with 5 cats. Non smoker - non drinker, caffeine : 1 cup a day ( coffee) no iced tea, nor soda.   Review of Systems: Out of a complete 14 system review, the patient complains of only the following symptoms, and all other reviewed systems are negative.  How likely are you to doze in the following situations: 0 = not  likely, 1 = slight chance, 2 = moderate chance, 3 = high chance  Sitting and Reading? 3 Watching Television? 3 Sitting inactive in a public place (theater or meeting)?1 As a passenger in a car for an hour without a break?1  Lying down in the afternoon when circumstances permit?2 Sitting and talking to someone?0 Sitting quietly after lunch without alcohol? 1 In a car, while stopped for a few minutes in traffic?0   Total = Epworth score down to 11 from pre CPAP 16/24 , Fatigue severity score 27- up from  20 ( post surgical pain, sleep deprivation)  , depression score 1/15 .  Nocturia - once nightly.    Social History   Socioeconomic History   Marital status: Married    Spouse name: Not on file   Number of children: 0   Years of education: Not on file   Highest education level: Not on file  Occupational History   Not on file  Social Needs   Financial resource strain: Not on file   Food insecurity    Worry: Not on file    Inability: Not on file   Transportation needs    Medical: Not on file    Non-medical: Not on file  Tobacco Use   Smoking status: Never Smoker   Smokeless tobacco: Former User    Types: Chew  Substance and Sexual Activity   Alcohol use: Not Currently    Comment: 04/27/2017 "1-2 drinks/year; if that"   Drug use: No   Sexual activity: Not Currently  Lifestyle   Physical activity  Days per week: Not on file    Minutes per session: Not on file   Stress: Not on file  Relationships   Social connections    Talks on phone: Not on file    Gets together: Not on file    Attends religious service: Not on file    Active member of club or organization: Not on file    Attends meetings of clubs or organizations: Not on file    Relationship status: Not on file   Intimate partner violence    Fear of current or ex partner: Not on file    Emotionally abused: Not on file    Physically abused: Not on file    Forced sexual activity: Not on file    Other Topics Concern   Not on file  Social History Narrative   Not on file    Family History  Problem Relation Age of Onset   Breast cancer Maternal Grandmother    Leukemia Paternal Grandfather    COPD Father    Heart disease Father    Heart disease Mother    Hypertension Mother    Colon cancer Neg Hx    Esophageal cancer Neg Hx    Rectal cancer Neg Hx    Stomach cancer Neg Hx     Past Medical History:  Diagnosis Date   Allergy    Anxiety    Arthritis    "knees, shoulders, back" (04/27/2017)   Carotid artery stenosis    Chronic back pain    "5 ruptured discs in the middle; 1 ruptured disc in the lower" (04/27/2017)   Coronary artery disease    2 cardiac stents  oct 2018   Depression    Diverticulitis    Exertional heat stroke ~ 01/2017   Family history of adverse reaction to anesthesia    "mom had emergency appy at age 79; she had severe short term memory loss; never recovered"   GERD (gastroesophageal reflux disease)    Glaucoma    laser treated   narrow angle   History of hiatal hernia    HLD (hyperlipidemia) 02/01/2019   Hyperlipidemia    Hypertension    OSA on CPAP    PVC (premature ventricular contraction)    Sleep apnea     Past Surgical History:  Procedure Laterality Date   CORONARY ANGIOPLASTY WITH STENT PLACEMENT  04/27/2017   x2    CORONARY STENT INTERVENTION N/A 04/27/2017   Procedure: CORONARY STENT INTERVENTION;  Surgeon: Nigel Mormon, MD;  Location: [facility];  Service: Cardiovascular;  Laterality: N/A;   LEFT HEART CATH AND CORONARY ANGIOGRAPHY N/A 04/27/2017   Procedure: LEFT HEART CATH AND CORONARY ANGIOGRAPHY;  Surgeon: Nigel Mormon, MD;  Location: [facility];  Service: Cardiovascular;  Laterality: N/A;   SHOULDER SURGERY Left ~ 1992   "tendon relocation; cut me open to do it"   SHOULDER SURGERY Right 08/04/2018   SURGERY SCROTAL / TESTICULAR Left ~ 1980   "cyst removed"    TONSILLECTOMY     TOTAL SHOULDER ARTHROPLASTY Left 05/18/2019   Procedure: Left Shoulder hardware removal with Anatomic shoulder arthroplasty;  Surgeon: Justice Britain, MD;  Location: [facility];  Service: Orthopedics;  Laterality: Left;  181mn    Current Outpatient Medications  Medication Sig Dispense Refill   aspirin 81 MG chewable tablet Chew 1 tablet (81 mg total) by mouth daily. 90 tablet 6   cetirizine (ZYRTEC) 10 MG tablet Take 10 mg by mouth daily.  DULoxetine (CYMBALTA) 30 MG capsule Take 30 mg by mouth 2 (two) times daily.      ezetimibe (ZETIA) 10 MG tablet Take 10 mg by mouth daily.     fluticasone (FLONASE) 50 MCG/ACT nasal spray Place 1 spray into both nostrils at bedtime.     metoprolol succinate (TOPROL-XL) 25 MG 24 hr tablet TAKE 1.5 TABLETS (37.5 MG TOTAL) BY MOUTH DAILY. 135 tablet 2   oxyCODONE-acetaminophen (PERCOCET) 5-325 MG tablet Take 1 tablet by mouth every 4 (four) hours as needed (max 6 q). 30 tablet 0   pantoprazole (PROTONIX) 40 MG tablet Take 1 tablet (40 mg total) by mouth daily. 30 tablet 6   Polyethyl Glycol-Propyl Glycol (SYSTANE ULTRA) 0.4-0.3 % SOLN Place 1-2 drops into both eyes daily as needed (eye irritation).     nitroGLYCERIN (NITROSTAT) 0.4 MG SL tablet Place 1 tablet (0.4 mg total) under the tongue every 5 (five) minutes as needed for chest pain. 90 tablet 3   rosuvastatin (CRESTOR) 20 MG tablet Take 1 tablet (20 mg total) by mouth at bedtime. 30 tablet 11   No current facility-administered medications for this visit.     Allergies as of 06/07/2019 - Review Complete 06/07/2019  Allergen Reaction Noted   Vicodin [hydrocodone-acetaminophen] Nausea Only 10/26/2011    Vitals: Last Weight:  Wt Readings from Last 1 Encounters:  06/07/19 211 lb (95.7 kg)   BMI:    Last Height:   Ht Readings from Last 1 Encounters:  06/07/19 _0  (1.803 m)    Physical exam:  General: The patient is awake, alert and appears not in acute  distress. The patient is well groomed. Head: Normocephalic, atraumatic. Neck is supple. Mallampati , 1- uvula lifts completely  neck circumference: 17. Nasal airflow patent , TMJ click is not  evident . Retrognathia is seen.  Used to wear braces top only.  Cardiovascular:  Regular rate and rhythm , without  murmurs or carotid bruit, and without distended neck veins. Respiratory: Lungs are clear to auscultation. Skin:  Without evidence of edema, or rash Trunk: BMI is 31.66 The patient's posture is erect   Neurologic exam: Attention span & concentration ability appears normal. Speech is fluent,  without  dysarthria, dysphonia or aphasia. Mood and affect are appropriate.  Cranial nerves:  Facial sensation intact to fine touch. Facial motor strength is symmetric and tongue and uvula move midline. Shoulder shrug was symmetrical.  Motor exam:  Normal tone, muscle bulk and symmetric strength in all extremities. He has shoulder pain and crepitus in both shoulders, now in a sling. Sensory:  Fine touch, pinprick and vibration were intact  in all extremities. Proprioception deferred. Numbness between big toe and neighbor on the left foot. S1?  Coordination:  Deferred.  Gait and station: Patient walks without assistive device - Strength within normal limits.Stance is stable and normal.     Assessment:  After physical and neurologic examination, review of laboratory studies,  Personal review of imaging studies, reports of other /same  Imaging studies, results of polysomnography and / or neurophysiology testing and pre-existing records as far as provided in visit., my assessment is:   1) since I have no access to the original sleep study and cannot interrogate a 60 year old ResMed CPAP anymore - software is no longer available. I only option is to repeat a sleep study see at what level of apnea the patient currently is affected if he still has apnea, and then work with an auto titrate him -  he is covered by  [employer].   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 His OSA needs to be treated to prevent heart disease progression.   The patient was advised of the nature of the diagnosed disorder , the treatment options and the  risks for general health and wellness arising from not treating the condition.   I spent more than 25 minutes of face to face time with the patient. Greater than 50% of time was spent in counseling and coordination of care. We have discussed the diagnosis and differential and I answered the patient's questions.    He works well with new CPAP autotitration machine- order was based on HST from 2018 !! No repeat study was performed.   Rv with NP in 12 month.     Larey Seat, MD 86/[id number]/8469, 6:29 PM  Certified in Neurology by ABPN Certified in [place] by [facility] [address] [place],  [address]           SLEEP MEDICINE CLINIC   Provider:  Larey Seat, MD   Primary Care Physician:  Jani Gravel, MD   Referring Provider: Jani Gravel, MD   HPI:  Traevion Poehler is a 58 y.o. male patient, now here for a revisit- 08-17-2018, he is presenting today with a sling around his arm, he is using this to support the right elbow.  He had shoulder surgery on January 16 of this year.  He continues to use his CPAP very compliantly 30 out of 30 days past but with the onset of the shoulder surgery he was up more often at night and has not been using it 4 hours consecutive every night.  However downloads from November and October confirmed that he was 97% compliant at that time.   He uses an AutoSet between 5 and 12 cmH2O with 3 cm EPR, his residual AHI is 4.2 and his average user time for this last month was 5 hours 8 minutes.  He has not slept well since his surgery due to post surgical pain-in the past 2 weeks since surgery he often has only slept between 2 and 4  hours and that is his fragmentation.  Fatigue: Is at 27 points, Epworth sleepiness score endorsed at 11 points again this is an unusual situation and may not reflect his normal sleepiness. He fel asleep in my waiting room.      02-10-2018, he is reporting excessive daytime sleepiness while using his old CPAP compliantly- We no longer have the software- last download was 5 years ago, according to patient., [facility].   Mr. Oshields was seen October 2018  here as in a referral  from Dr. Maudie Mercury for a sleep apnea re- evaluation.  Mr. Rivero was last seen in October 2018 when he underwent a HST sleep study to confirm that he still had apnea and of what kind.  The sleep study returned positive AHI of 13.2/h and a CPAP order had been issued, but the patient learned that also he had met his out-of-pocket cost and deductible the insurance company did not want to cover a new CPAP.  They actually asked the patient to return to the machine for 1 year which is these days a very common request, but would have added cost to the patient. He is now in need of a new CPSAP, uses compliantly his old machine. It's not downloadable.  He is more fatigued.  Epworth 16/ 24. Had recenlty a heat stroke. See last years HST below. CD    NAME:  Merik Mignano                                                             DOB: May 17, 1961 MEDICAL RECORD NUMBER [id number]                                         DOS: 05/19/2017 REFERRING PHYSICIAN: Jani Gravel, M.D.  STUDY PERFORMED: Home Sleep Study HISTORY:   58 year old male patient of Dr. Noel Journey, who tested positive for OSA 2011.  He was placed on CPAP at that time. Pt has not been followed up with since.  Pt is in need of new machine and equipment. BMI 31.2. Epworth Sleepiness Score endorsed at 15/24 points, FSS 20 points,         STUDY RESULTS: Total Recording Time:  8 hours, 37 minutes Total Apnea/Hypopnea Index (AHI):   13.2 /hour and RDI was 15.6 /hr. Average Oxygen  Saturation SpO2 at 91%; Lowest Oxygen Saturation 80 %  Total Time in Oxygen Saturation Below 89%: 66 minutes= 13 % Average Heart Rate: 71 bpm (60 - 113 bpm)  IMPRESSION: Mild and mainly obstructive sleep apnea with prolonged desaturation time- this constellation warrants CPAP therapy.  RECOMMENDATION: Auto-titrating CPAP with a window of 5-12 cm water pressure, with mask of choice, heated humidity. I certify that I have reviewed the raw data recording prior to the issuance of this report in accordance with the standards of the American Academy of Sleep Medicine (AASM). Larey Seat, M.D.     05-21-2017    Mr. Koeppen is a 58 year old right-handed Caucasian patient of Dr. Maudie Mercury. He remembers a sleep study taking place about 7 years ago at [facility] on [address] in the basement of Dr. Julianne Rice office. He was then placed on CPAP and has used it ever since. He still uses his first machine with the same settings that were prescribed in 2011. In the meantime he has sometimes lost weight and sometimes gained weight, he may have been as heavy as 220 pounds at the time of his sleep study. He also remembers that changes in his body mass index seemed to not correlate with changes in his apnea frequency or his need for CPAP. Dr. Maudie Mercury initiated a consultation with Dr. Vinetta Bergamo, an otolaryngologist at [place]. Dr. Vinetta Bergamo was fascinated with the epiglottal anatomy of the patient. However no specific treatment could be initiated or resulted. Dr. Vinetta Bergamo discouraged at the time a UPPP.  Mr. Ager is here today because he hasn't had follow-up for his CPAP needs, does not know if he has a certain degree of apnea or if he is perhaps even apnea free.  He reportedly has used his CPAP compliantly ever since it was issued and when he doesn't use CPAP (during an unscheduled nap for example), he feels that his sleep is not is refreshing and restorative.  Mr. Ewings is most recent encounter with Dr. Maudie Mercury was related  to a syncope. It is presumed that he had a heat stroke. Extensive laboratory testing showed elevated transaminases, elevated  monocytes, elevated hematocrit. I do not think that he was dehydrated as his BUN was 14.2. Glucose was normal. There was no evidence of a silent myocardial infarction a CT of the head without contrast was negative, carotid duplex was normal, cardiac stress test by treadmill was abnormal. His  echocardiograms is pending .  Sleep habits are as follows: Last hour before bedtime : feeds the cats, and watches TV in the living room, goes to bed at about 11 Pm, his wife is already in bed by 9 PM.The patient has at least one bathroom break usually in the early morning hours( 3-4 AM) and can go back to sleep after that. He is using CPAP every night. He averages at least 7 hours of nocturnal sleep. He does not report any sleep choking, palpitations, diaphoresis at night. Over the last year he has developed very vivid sometimes bizarre dreams, lucid dreams.. These include encounters with now deceased family members etc.and  he has not enacted dreams. He gets up at 6.30.   Sleep medical history and family sleep history: single child, both parents snored. Mother has dementia. Tonsillectomy at age 19-5 , and nasal septoplasty at age 71 and broke nose again in high school .  Social history: he works outdoors, Biomedical scientist. married with 5 cats. Non smoker - non drinker, caffeine : 1 cup a day ( coffee) no iced tea, nor soda.   Review of Systems: Out of a complete 14 system review, the patient complains of only the following symptoms, and all other reviewed systems are negative.  How likely are you to doze in the following situations: 0 = not likely, 1 = slight chance, 2 = moderate chance, 3 = high chance  Sitting and Reading? 3 Watching Television? 3 Sitting inactive in a public place (theater or meeting)?1 As a passenger in a car for an hour without a break?1  Lying down in the afternoon  when circumstances permit?2 Sitting and talking to someone?0 Sitting quietly after lunch without alcohol? 1 In a car, while stopped for a few minutes in traffic?0   Total = Epworth score down to 11 from pre CPAP 16/24 , Fatigue severity score 27- up from  20 ( post surgical pain, sleep deprivation)  , depression score 1/15 .  Nocturia - once nightly.    Social History   Socioeconomic History   Marital status: Married    Spouse name: Not on file   Number of children: 0   Years of education: Not on file   Highest education level: Not on file  Occupational History   Not on file  Social Needs   Financial resource strain: Not on file   Food insecurity    Worry: Not on file    Inability: Not on file   Transportation needs    Medical: Not on file    Non-medical: Not on file  Tobacco Use   Smoking status: Never Smoker   Smokeless tobacco: Former User    Types: Chew  Substance and Sexual Activity   Alcohol use: Not Currently    Comment: 04/27/2017 "1-2 drinks/year; if that"   Drug use: No   Sexual activity: Not Currently  Lifestyle   Physical activity    Days per week: Not on file    Minutes per session: Not on file   Stress: Not on file  Relationships   Social connections    Talks on phone: Not on file    Gets together: Not on file  Attends religious service: Not on file    Active member of club or organization: Not on file    Attends meetings of clubs or organizations: Not on file    Relationship status: Not on file   Intimate partner violence    Fear of current or ex partner: Not on file    Emotionally abused: Not on file    Physically abused: Not on file    Forced sexual activity: Not on file  Other Topics Concern   Not on file  Social History Narrative   Not on file    Family History  Problem Relation Age of Onset   Breast cancer Maternal Grandmother    Leukemia Paternal Grandfather    COPD Father    Heart disease Father     Heart disease Mother    Hypertension Mother    Colon cancer Neg Hx    Esophageal cancer Neg Hx    Rectal cancer Neg Hx    Stomach cancer Neg Hx     Past Medical History:  Diagnosis Date   Allergy    Anxiety    Arthritis    "knees, shoulders, back" (04/27/2017)   Carotid artery stenosis    Chronic back pain    "5 ruptured discs in the middle; 1 ruptured disc in the lower" (04/27/2017)   Coronary artery disease    2 cardiac stents  oct 2018   Depression    Diverticulitis    Exertional heat stroke ~ 01/2017   Family history of adverse reaction to anesthesia    "mom had emergency appy at age 26; she had severe short term memory loss; never recovered"   GERD (gastroesophageal reflux disease)    Glaucoma    laser treated   narrow angle   History of hiatal hernia    HLD (hyperlipidemia) 02/01/2019   Hyperlipidemia    Hypertension    OSA on CPAP    PVC (premature ventricular contraction)    Sleep apnea     Past Surgical History:  Procedure Laterality Date   CORONARY ANGIOPLASTY WITH STENT PLACEMENT  04/27/2017   x2    CORONARY STENT INTERVENTION N/A 04/27/2017   Procedure: CORONARY STENT INTERVENTION;  Surgeon: Nigel Mormon, MD;  Location: [facility];  Service: Cardiovascular;  Laterality: N/A;   LEFT HEART CATH AND CORONARY ANGIOGRAPHY N/A 04/27/2017   Procedure: LEFT HEART CATH AND CORONARY ANGIOGRAPHY;  Surgeon: Nigel Mormon, MD;  Location: [facility];  Service: Cardiovascular;  Laterality: N/A;   SHOULDER SURGERY Left ~ 1992   "tendon relocation; cut me open to do it"   SHOULDER SURGERY Right 08/04/2018   SURGERY SCROTAL / TESTICULAR Left ~ 1980   "cyst removed"   TONSILLECTOMY     TOTAL SHOULDER ARTHROPLASTY Left 05/18/2019   Procedure: Left Shoulder hardware removal with Anatomic shoulder arthroplasty;  Surgeon: Justice Britain, MD;  Location: [facility];  Service: Orthopedics;  Laterality: Left;  145mn     Current Outpatient Medications  Medication Sig Dispense Refill   aspirin 81 MG chewable tablet Chew 1 tablet (81 mg total) by mouth daily. 90 tablet 6   cetirizine (ZYRTEC) 10 MG tablet Take 10 mg by mouth daily.      DULoxetine (CYMBALTA) 30 MG capsule Take 30 mg by mouth 2 (two) times daily.      ezetimibe (ZETIA) 10 MG tablet Take 10 mg by mouth daily.     fluticasone (FLONASE) 50 MCG/ACT nasal spray Place 1 spray  into both nostrils at bedtime.     metoprolol succinate (TOPROL-XL) 25 MG 24 hr tablet TAKE 1.5 TABLETS (37.5 MG TOTAL) BY MOUTH DAILY. 135 tablet 2   oxyCODONE-acetaminophen (PERCOCET) 5-325 MG tablet Take 1 tablet by mouth every 4 (four) hours as needed (max 6 q). 30 tablet 0   pantoprazole (PROTONIX) 40 MG tablet Take 1 tablet (40 mg total) by mouth daily. 30 tablet 6   Polyethyl Glycol-Propyl Glycol (SYSTANE ULTRA) 0.4-0.3 % SOLN Place 1-2 drops into both eyes daily as needed (eye irritation).     nitroGLYCERIN (NITROSTAT) 0.4 MG SL tablet Place 1 tablet (0.4 mg total) under the tongue every 5 (five) minutes as needed for chest pain. 90 tablet 3   rosuvastatin (CRESTOR) 20 MG tablet Take 1 tablet (20 mg total) by mouth at bedtime. 30 tablet 11   No current facility-administered medications for this visit.     Allergies as of 06/07/2019 - Review Complete 06/07/2019  Allergen Reaction Noted   Vicodin [hydrocodone-acetaminophen] Nausea Only 10/26/2011    Vitals: Last Weight:  Wt Readings from Last 1 Encounters:  06/07/19 211 lb (95.7 kg)   BMI:    Last Height:   Ht Readings from Last 1 Encounters:  06/07/19 _0  (1.803 m)    Physical exam:  General: The patient is awake, alert and appears not in acute distress. The patient is well groomed. Head: Normocephalic, atraumatic. Neck is supple. Mallampati , 1- uvula lifts completely  neck circumference: 17. Nasal airflow patent , TMJ click is not  evident . Retrognathia is seen.  Used to wear braces top  only.  Cardiovascular:  Regular rate and rhythm , without  murmurs or carotid bruit, and without distended neck veins. Respiratory: Lungs are clear to auscultation. Skin:  Without evidence of edema, or rash Trunk: BMI is 31.66 The patient's posture is erect   Neurologic exam: Attention span & concentration ability appears normal. Speech is fluent,  without  dysarthria, dysphonia or aphasia. Mood and affect are appropriate.  Cranial nerves:  Facial sensation intact to fine touch. Facial motor strength is symmetric and tongue and uvula move midline. Shoulder shrug was symmetrical.  Motor exam:  Normal tone, muscle bulk and symmetric strength in all extremities. He has shoulder pain and crepitus in both shoulders, now in a sling. Sensory:  Fine touch, pinprick and vibration were intact  in all extremities. Proprioception deferred. Numbness between big toe and neighbor on the left foot. S1?  Coordination:  Deferred.  Gait and station: Patient walks without assistive device - Strength within normal limits.Stance is stable and normal.     Assessment:  After physical and neurologic examination, review of laboratory studies,  Personal review of imaging studies, reports of other /same  Imaging studies, results of polysomnography and / or neurophysiology testing and pre-existing records as far as provided in visit., my assessment is:   PS:  He used the machine in hospital for 2 nights, and he reports he has still breakthrough apnea when in supine. His shoulders have been making it difficult to sleep laterally. I can adjust and raise the upper pressure window for this special time. He also has limited dexterity and cannot always switch the machine off, often just removed the mask. This leads to additional erroneous counts of apnea and air leak.   1) uses compliantly his new CPAP.   2) CAD - 85% and 65% stenosis in the art circumflex-  2 medicated stents placed on 04-27-17 by Dr Virgina Jock.  His OSA needs  to be treated to prevent heart disease progression.   3) shoulder surgery for the seventh time ?? Cant sleep on the side may need a higher pressure on CPAP>   The patient was advised of the nature of the diagnosed disorder , the treatment options and the  risks for general health and wellness arising from not treating the condition.   I spent more than 15 minutes of face to face time with the patient. Greater than 50% of time was spent in counseling and coordination of care. We have discussed the diagnosis and differential and I answered the patient's questions.     Rv with NP in 12 month.     Larey Seat, MD 69/45/0388, 8:28 PM  Certified in Neurology by ABPN Certified in [place] by [facility] [address] [place], [place] [address]

## 2019-07-21 HISTORY — PX: SHOULDER SURGERY: SHX246

## 2019-08-17 ENCOUNTER — Other Ambulatory Visit: Payer: Self-pay

## 2019-08-17 ENCOUNTER — Ambulatory Visit (INDEPENDENT_AMBULATORY_CARE_PROVIDER_SITE_OTHER): Payer: Managed Care, Other (non HMO)

## 2019-08-17 DIAGNOSIS — I2510 Atherosclerotic heart disease of native coronary artery without angina pectoris: Secondary | ICD-10-CM

## 2019-08-17 DIAGNOSIS — I6522 Occlusion and stenosis of left carotid artery: Secondary | ICD-10-CM | POA: Diagnosis not present

## 2019-08-24 ENCOUNTER — Other Ambulatory Visit: Payer: Self-pay | Admitting: Cardiology

## 2019-08-24 DIAGNOSIS — I6522 Occlusion and stenosis of left carotid artery: Secondary | ICD-10-CM

## 2019-08-24 NOTE — Progress Notes (Signed)
Subjective:   William Mason, male    DOB: November 13, 1960, 59 y.o.   MRN: [id number]    Chief complaint:  Coronary artery disease   HPI  59 y/o Caucasian male with CAD s/p LCx/OM PCI 04/2017, mod asympytomatic lt carotid stenosis, hypertension, hyperlipidemia.  Patient two episodes of retrosternal chest pain last week. One episode woke him up from sleep, lasted for about 15 min. He took 3 baby Aspirin. Another episode occurred the day after while he was driving, lasting for 5 min.  He has been under a lot of stress lately. He underwent left shoulder surgery a few months ago, but has had a lot of mobility issues thereafter. He is going to need redo surgery in the near future.    Current Outpatient Medications on File Prior to Visit  Medication Sig Dispense Refill  . aspirin 81 MG chewable tablet Chew 1 tablet (81 mg total) by mouth daily. 90 tablet 6  . cetirizine (ZYRTEC) 10 MG tablet Take 10 mg by mouth daily.     . DULoxetine (CYMBALTA) 20 MG capsule Take 20 mg by mouth daily.    Marland Kitchen ezetimibe (ZETIA) 10 MG tablet Take 10 mg by mouth daily.    . fluticasone (FLONASE) 50 MCG/ACT nasal spray Place 1 spray into both nostrils at bedtime.    . metoprolol succinate (TOPROL-XL) 25 MG 24 hr tablet TAKE 1.5 TABLETS (37.5 MG TOTAL) BY MOUTH DAILY. 135 tablet 2  . oxyCODONE-acetaminophen (PERCOCET) 5-325 MG tablet Take 1 tablet by mouth every 4 (four) hours as needed (max 6 q). 30 tablet 0  . pantoprazole (PROTONIX) 40 MG tablet Take 1 tablet (40 mg total) by mouth daily. 30 tablet 6  . Polyethyl Glycol-Propyl Glycol (SYSTANE ULTRA) 0.4-0.3 % SOLN Place 1-2 drops into both eyes daily as needed (eye irritation).    . nitroGLYCERIN (NITROSTAT) 0.4 MG SL tablet Place 1 tablet (0.4 mg total) under the tongue every 5 (five) minutes as needed for chest pain. 90 tablet 3  . rosuvastatin (CRESTOR) 20 MG tablet Take 1 tablet (20 mg total) by mouth at bedtime. 30 tablet 11   No current  facility-administered medications on file prior to visit.    Cardiovascular studies:  EKG 08/25/2019: Sinus  Rhythm  Right bundle branch block.  Left atrial enlargement.   Carotid artery duplex 07/08/7587:  Peak systolic velocities in the right bifurcation, internal, external and  common carotid arteries are within normal limits. Minimal plaque right  ICA.  Stenosis in the left internal carotid artery (16-49%), upper limit of  Dopplers with ICA/CCA ratio of 1.9. Stenosis in the left external carotid  artery (<50%).  Antegrade right vertebral artery flow. Antegrade left vertebral artery  flow.  Follow up in one year is appropriate if clinically indicated.  Coronary angiography 04/27/2017: Single vessel severe coronary arter disease OM1 tandem 90% & 60% stenoses with resting Pd/Pa 0.79. Successful percutaneous coronary intervention     PTCA and overlapping stents placement OM1 Synergy DES 3.5 X 12 mm & 3.0 X 12 mm distally  Echocardiogram 04/01/2017: Left ventricle cavity is normal in size. Normal global wall motion. Normal diastolic filling pattern. Calculated EF 67%. Left atrial cavity is normal in size. 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 No evidence of pulmonary hypertension.   Recent labs: 08/02/2019: Chol 148, TG 245, HDL 40, LDL 64.   01/30/2019: Glucose 107. BUN/Cr 13/1.03. eGFR 79. Na/K 143/4.5. Rest of the CMP normal.  Chol 141,  TG 172, HDL 43, LDL 64.   Review of Systems  Cardiovascular: Negative for chest pain, dyspnea on exertion, leg swelling, palpitations and syncope.    Vitals:   08/25/19 0838  BP: 134/76  Pulse: 68  Resp: 16  Temp: (!) 97.2 F (36.2 C)  SpO2: 97%     Objective:    Physical Exam  Constitutional: He appears well-developed and well-nourished.  Neck: No JVD present.  Cardiovascular: Normal rate, regular rhythm, normal heart sounds and intact distal pulses.  No murmur  heard. Pulmonary/Chest: Effort normal and breath sounds normal. He has no wheezes. He has no rales.  Musculoskeletal:        General: No edema.  Nursing note and vitals reviewed.       Assessment & Recommendations:   59 y/o Caucasian male with CAD s/p LCx/OM PCI 04/2017, mod asympytomatic lt carotid stenosis, hypertension, hyperlipidemia.  CAD: Recent symptoms concerning for unstable angina.  I long discussion with the patient regarding medical versus medical plus invasive management.  Timing of this is crucial given his anticipated upcoming shoulder surgery.  If he does have severe obstructive CAD, his cardiac risk would be higher when he undergo shoulder surgery.  Coronary angiography will be the only definitive way to evaluate obstructive CAD, and potentially treated at the same time. With his upcoming shoulder surgery in mind, I will only perform intervention if severe stenosis found.  With resolute Onyx stent, hopefully, we could get by 1 month of dual antiplatelet therapy and proceed with shoulder surgery thereafter with relatively low risk of stent thrombosis.  Continue aspirin, crestor + zetia, metoprolol succinate. Added Imdur 30 mg daily. Low cardiac risk for shoulder surgeries/ epidural injection.  Hyperlipidemia: Significant improvement on crestor + zetia. Triglycerides elevated.  If he has obstructive CAD, I will add Vascepa 2 g twice daily.  Mod lt ICA stenosis: Asymptomatic. Continue aggressive medical management. Repeat carotid duplex in 6 months.   Nigel Mormon, MD [facility] [place] Pager: [phone] Office: [phone] If no answer Cell [phone]

## 2019-08-25 ENCOUNTER — Other Ambulatory Visit (HOSPITAL_COMMUNITY)
Admission: RE | Admit: 2019-08-25 | Discharge: 2019-08-25 | Disposition: A | Discharge status: Home / Self Care | Payer: Managed Care, Other (non HMO) | Source: Ambulatory Visit | Attending: Cardiology | Admitting: Cardiology

## 2019-08-25 ENCOUNTER — Encounter: Payer: Self-pay | Admitting: Cardiology

## 2019-08-25 ENCOUNTER — Other Ambulatory Visit: Payer: Self-pay

## 2019-08-25 ENCOUNTER — Ambulatory Visit (INDEPENDENT_AMBULATORY_CARE_PROVIDER_SITE_OTHER): Payer: Managed Care, Other (non HMO) | Admitting: Cardiology

## 2019-08-25 VITALS — BP 134/76 | HR 68 | Temp 97.2°F | Resp 16 | Ht 71.0 in | Wt 213.8 lb

## 2019-08-25 DIAGNOSIS — Z01812 Encounter for preprocedural laboratory examination: Secondary | ICD-10-CM | POA: Diagnosis present

## 2019-08-25 DIAGNOSIS — I25110 Atherosclerotic heart disease of native coronary artery with unstable angina pectoris: Secondary | ICD-10-CM

## 2019-08-25 DIAGNOSIS — I10 Essential (primary) hypertension: Secondary | ICD-10-CM | POA: Diagnosis not present

## 2019-08-25 DIAGNOSIS — Z20822 Contact with and (suspected) exposure to covid-19: Secondary | ICD-10-CM | POA: Insufficient documentation

## 2019-08-25 DIAGNOSIS — E782 Mixed hyperlipidemia: Secondary | ICD-10-CM | POA: Diagnosis not present

## 2019-08-25 DIAGNOSIS — I6522 Occlusion and stenosis of left carotid artery: Secondary | ICD-10-CM

## 2019-08-25 DIAGNOSIS — I2510 Atherosclerotic heart disease of native coronary artery without angina pectoris: Secondary | ICD-10-CM

## 2019-08-25 DIAGNOSIS — I200 Unstable angina: Secondary | ICD-10-CM

## 2019-08-25 LAB — SARS CORONAVIRUS 2 (TAT 6-24 HRS): SARS Coronavirus 2: NEGATIVE

## 2019-08-25 MED ORDER — ISOSORBIDE MONONITRATE ER 30 MG PO TB24: 30.0000 mg | ORAL_TABLET | Freq: Every day | ORAL | 3 refills | Status: DC

## 2019-08-26 LAB — BASIC METABOLIC PANEL
BUN/Creatinine Ratio: 13 (ref 10–24)
BUN: 16 mg/dL (ref 8–27)
CO2: 25 mmol/L (ref 20–29)
Calcium: 9.2 mg/dL (ref 8.6–10.2)
Chloride: 101 mmol/L (ref 96–106)
Creatinine, Ser: 1.2 mg/dL (ref 0.76–1.27)
GFR calc Af Amer: 76 mL/min/{1.73_m2} (ref >59)
GFR calc non Af Amer: 65 mL/min/{1.73_m2} (ref >59)
Glucose: 100 mg/dL — ABNORMAL HIGH (ref 65–99)
Potassium: 4.9 mmol/L (ref 3.5–5.2)
Sodium: 141 mmol/L (ref 134–144)

## 2019-08-26 LAB — CBC
Hematocrit: 47.4 % (ref 37.5–51.0)
Hemoglobin: 16.1 g/dL (ref 13.0–17.7)
MCH: 31.2 pg (ref 26.6–33.0)
MCHC: 34 g/dL (ref 31.5–35.7)
MCV: 92 fL (ref 79–97)
Platelets: 239 10*3/uL (ref 150–450)
RBC: 5.16 x10E6/uL (ref 4.14–5.80)
RDW: 12.6 % (ref 11.6–15.4)
WBC: 8.5 10*3/uL (ref 3.4–10.8)

## 2019-08-27 DIAGNOSIS — I200 Unstable angina: Secondary | ICD-10-CM | POA: Diagnosis present

## 2019-08-29 ENCOUNTER — Other Ambulatory Visit: Payer: Self-pay

## 2019-08-29 ENCOUNTER — Ambulatory Visit (HOSPITAL_COMMUNITY)
Admission: RE | Disposition: A | Discharge status: Home / Self Care | Payer: Self-pay | Source: Home / Self Care | Attending: Cardiology

## 2019-08-29 ENCOUNTER — Other Ambulatory Visit: Payer: Self-pay | Admitting: Cardiology

## 2019-08-29 ENCOUNTER — Ambulatory Visit (HOSPITAL_COMMUNITY)
Admission: RE | Admit: 2019-08-29 | Discharge: 2019-08-29 | Disposition: A | Discharge status: Home / Self Care | Payer: Managed Care, Other (non HMO) | Attending: Cardiology | Admitting: Cardiology

## 2019-08-29 DIAGNOSIS — E781 Pure hyperglyceridemia: Secondary | ICD-10-CM | POA: Diagnosis not present

## 2019-08-29 DIAGNOSIS — Z955 Presence of coronary angioplasty implant and graft: Secondary | ICD-10-CM | POA: Diagnosis not present

## 2019-08-29 DIAGNOSIS — I25110 Atherosclerotic heart disease of native coronary artery with unstable angina pectoris: Secondary | ICD-10-CM | POA: Diagnosis not present

## 2019-08-29 DIAGNOSIS — I200 Unstable angina: Secondary | ICD-10-CM

## 2019-08-29 DIAGNOSIS — E785 Hyperlipidemia, unspecified: Secondary | ICD-10-CM | POA: Diagnosis not present

## 2019-08-29 DIAGNOSIS — Z7982 Long term (current) use of aspirin: Secondary | ICD-10-CM | POA: Insufficient documentation

## 2019-08-29 DIAGNOSIS — I6522 Occlusion and stenosis of left carotid artery: Secondary | ICD-10-CM | POA: Diagnosis not present

## 2019-08-29 DIAGNOSIS — Z79899 Other long term (current) drug therapy: Secondary | ICD-10-CM | POA: Diagnosis not present

## 2019-08-29 DIAGNOSIS — K219 Gastro-esophageal reflux disease without esophagitis: Secondary | ICD-10-CM

## 2019-08-29 HISTORY — PX: LEFT HEART CATH AND CORONARY ANGIOGRAPHY: CATH118249

## 2019-08-29 SURGERY — LEFT HEART CATH AND CORONARY ANGIOGRAPHY
Anesthesia: LOCAL

## 2019-08-29 MED ORDER — SODIUM CHLORIDE 0.9 % IV SOLN: 250.0000 mL | INTRAVENOUS | Status: DC | PRN

## 2019-08-29 MED ORDER — HEPARIN SODIUM (PORCINE) 1000 UNIT/ML IJ SOLN
INTRAMUSCULAR | Status: AC
  Filled 2019-08-29: qty 1

## 2019-08-29 MED ORDER — VERAPAMIL HCL 2.5 MG/ML IV SOLN
INTRAVENOUS | Status: AC
  Filled 2019-08-29: qty 2

## 2019-08-29 MED ORDER — LIDOCAINE HCL (PF) 1 % IJ SOLN
INTRAMUSCULAR | Status: AC
  Filled 2019-08-29: qty 30

## 2019-08-29 MED ORDER — HEPARIN SODIUM (PORCINE) 1000 UNIT/ML IJ SOLN
INTRAMUSCULAR | Status: DC | PRN
  Administered 2019-08-29: 5000 [IU] via INTRAVENOUS

## 2019-08-29 MED ORDER — SODIUM CHLORIDE 0.9% FLUSH: 3.0000 mL | INTRAVENOUS | Status: DC | PRN

## 2019-08-29 MED ORDER — HEPARIN (PORCINE) IN NACL 1000-0.9 UT/500ML-% IV SOLN
INTRAVENOUS | Status: AC
  Filled 2019-08-29: qty 500

## 2019-08-29 MED ORDER — SODIUM CHLORIDE 0.9% FLUSH: 3.0000 mL | Freq: Two times a day (BID) | INTRAVENOUS | Status: DC

## 2019-08-29 MED ORDER — HEPARIN (PORCINE) IN NACL 1000-0.9 UT/500ML-% IV SOLN
INTRAVENOUS | Status: DC | PRN
  Administered 2019-08-29 (×2): 500 mL

## 2019-08-29 MED ORDER — ASPIRIN 81 MG PO CHEW: 81.0000 mg | CHEWABLE_TABLET | ORAL | Status: DC

## 2019-08-29 MED ORDER — HYDRALAZINE HCL 20 MG/ML IJ SOLN: 10.0000 mg | INTRAMUSCULAR | Status: DC | PRN

## 2019-08-29 MED ORDER — RABEPRAZOLE SODIUM 20 MG PO TBEC: 20.0000 mg | DELAYED_RELEASE_TABLET | Freq: Every day | ORAL | 3 refills | Status: DC

## 2019-08-29 MED ORDER — SODIUM CHLORIDE 0.9 % WEIGHT BASED INFUSION: 1.0000 mL/kg/h | INTRAVENOUS | Status: DC

## 2019-08-29 MED ORDER — MIDAZOLAM HCL 2 MG/2ML IJ SOLN
INTRAMUSCULAR | Status: DC | PRN
  Administered 2019-08-29: 1 mg via INTRAVENOUS

## 2019-08-29 MED ORDER — ONDANSETRON HCL 4 MG/2ML IJ SOLN: 4.0000 mg | Freq: Four times a day (QID) | INTRAMUSCULAR | Status: DC | PRN

## 2019-08-29 MED ORDER — MIDAZOLAM HCL 2 MG/2ML IJ SOLN
INTRAMUSCULAR | Status: AC
  Filled 2019-08-29: qty 2

## 2019-08-29 MED ORDER — FENTANYL CITRATE (PF) 100 MCG/2ML IJ SOLN
INTRAMUSCULAR | Status: DC | PRN
  Administered 2019-08-29: 25 ug via INTRAVENOUS

## 2019-08-29 MED ORDER — SODIUM CHLORIDE 0.9 % IV SOLN: INTRAVENOUS | Status: DC

## 2019-08-29 MED ORDER — IOHEXOL 350 MG/ML SOLN
INTRAVENOUS | Status: DC | PRN
  Administered 2019-08-29: 35 mL via INTRA_ARTERIAL

## 2019-08-29 MED ORDER — ACETAMINOPHEN 325 MG PO TABS: 650.0000 mg | ORAL_TABLET | ORAL | Status: DC | PRN

## 2019-08-29 MED ORDER — VERAPAMIL HCL 2.5 MG/ML IV SOLN
INTRAVENOUS | Status: DC | PRN
  Administered 2019-08-29: 10 mL via INTRA_ARTERIAL

## 2019-08-29 MED ORDER — SODIUM CHLORIDE 0.9 % WEIGHT BASED INFUSION
3.0000 mL/kg/h | INTRAVENOUS | Status: AC
  Administered 2019-08-29: 13:00:00 3 mL/kg/h via INTRAVENOUS

## 2019-08-29 MED ORDER — FENTANYL CITRATE (PF) 100 MCG/2ML IJ SOLN
INTRAMUSCULAR | Status: AC
  Filled 2019-08-29: qty 2

## 2019-08-29 MED ORDER — LABETALOL HCL 5 MG/ML IV SOLN: 10.0000 mg | INTRAVENOUS | Status: DC | PRN

## 2019-08-29 SURGICAL SUPPLY — 10 items
CATH 5FR JL3.5 JR4 ANG PIG MP (CATHETERS) ×1 IMPLANT
DEVICE RAD COMP TR BAND LRG (VASCULAR PRODUCTS) ×1 IMPLANT
GLIDESHEATH SLEND A-KIT 6F 22G (SHEATH) ×1 IMPLANT
GUIDEWIRE INQWIRE 1.5J.035X260 (WIRE) IMPLANT
INQWIRE 1.5J .035X260CM (WIRE) ×2
KIT HEART LEFT (KITS) ×2 IMPLANT
PACK CARDIAC CATHETERIZATION (CUSTOM PROCEDURE TRAY) ×2 IMPLANT
SYR MEDRAD MARK 7 150ML (SYRINGE) ×2 IMPLANT
TRANSDUCER W/STOPCOCK (MISCELLANEOUS) ×2 IMPLANT
TUBING CIL FLEX 10 FLL-RA (TUBING) ×2 IMPLANT

## 2019-08-29 NOTE — Discharge Instructions (Signed)
Radial Site Care  This sheet gives you information about how to care for yourself after your procedure. Your health care provider may also give you more specific instructions. If you have problems or questions, contact your health care provider. What can I expect after the procedure? After the procedure, it is common to have:  Bruising and tenderness at the catheter insertion area. Follow these instructions at home: Medicines  Take over-the-counter and prescription medicines only as told by your health care provider. Insertion site care  Follow instructions from your health care provider about how to take care of your insertion site. Make sure you: ? Wash your hands with soap and water before you change your bandage (dressing). If soap and water are not available, use hand sanitizer. ? Change your dressing as told by your health care provider. ? Leave stitches (sutures), skin glue, or adhesive strips in place. These skin closures may need to stay in place for 2 weeks or longer. If adhesive strip edges start to loosen and curl up, you may trim the loose edges. Do not remove adhesive strips completely unless your health care provider tells you to do that.  Check your insertion site every day for signs of infection. Check for: ? Redness, swelling, or pain. ? Fluid or blood. ? Pus or a bad smell. ? Warmth.  Do not take baths, swim, or use a hot tub until your health care provider approves.  You may shower 24-48 hours after the procedure, or as directed by your health care provider. ? Remove the dressing and gently wash the site with plain soap and water. ? Pat the area dry with a clean towel. ? Do not rub the site. That could cause bleeding.  Do not apply powder or lotion to the site. Activity   For 24 hours after the procedure, or as directed by your health care provider: ? Do not flex or bend the affected arm. ? Do not push or pull heavy objects with the affected arm. ? Do not  drive yourself home from the hospital or clinic. You may drive 24 hours after the procedure unless your health care provider tells you not to. ? Do not operate machinery or power tools.  Do not lift anything that is heavier than 10 lb (4.5 kg), or the limit that you are told, until your health care provider says that it is safe.  Ask your health care provider when it is okay to: ? Return to work or school. ? Resume usual physical activities or sports. ? Resume sexual activity. General instructions  If the catheter site starts to bleed, raise your arm and put firm pressure on the site. If the bleeding does not stop, get help right away. This is a medical emergency.  If you went home on the same day as your procedure, a responsible adult should be with you for the first 24 hours after you arrive home.  Keep all follow-up visits as told by your health care provider. This is important. Contact a health care provider if:  You have a fever.  You have redness, swelling, or yellow drainage around your insertion site. Get help right away if:  You have unusual pain at the radial site.  The catheter insertion area swells very fast.  The insertion area is bleeding, and the bleeding does not stop when you hold steady pressure on the area.  Your arm or hand becomes pale, cool, tingly, or numb. These symptoms may represent a serious problem   that is an emergency. Do not wait to see if the symptoms will go away. Get medical help right away. Call your local emergency services (911 in the [place] Do not drive yourself to the hospital. Summary  After the procedure, it is common to have bruising and tenderness at the site.  Follow instructions from your health care provider about how to take care of your radial site wound. Check the wound every day for signs of infection.  Do not lift anything that is heavier than 10 lb (4.5 kg), or the limit that you are told, until your health care provider says  that it is safe. This information is not intended to replace advice given to you by your health care provider. Make sure you discuss any questions you have with your health care provider. Document Revised: 08/11/2017 Document Reviewed: 08/11/2017 Elsevier Patient Education  2020 [employer]  

## 2019-08-29 NOTE — Interval H&P Note (Signed)
History and Physical Interval Note:  08/29/2019 2:11 PM  William Mason  has presented today for surgery, with the diagnosis of Positive stress test.  The various methods of treatment have been discussed with the patient and family. After consideration of risks, benefits and other options for treatment, the patient has consented to  Procedure(s): LEFT HEART CATH AND CORONARY ANGIOGRAPHY (N/A) as a surgical intervention.  The patient's history has been reviewed, patient examined, no change in status, stable for surgery.  I have reviewed the patient's chart and labs.  Questions were answered to the patient's satisfaction.    2016 Appropriate Use Criteria for Coronary Revascularization in Patients With Acute Coronary Syndrome NSTEMI/UA Intermediate Risk (TIMI Score 3-4) NSTEMI/Unstable angina, stabilized patient at Intermediate Risk (TIMI Score 3-4) Link Here: http://dodson-rose.net/ Indication:  Revascularization by PCI or CABG of 1 or more arteries in a patient with NSTEMI or unstable angina with Stabilization after presentation Intermediate risk for clinical events  A (7) Indication: 16; Score 7    [place]

## 2019-08-29 NOTE — H&P (Signed)
OV note 08/25/19 copied for documentation     Subjective:   William Mason, male    DOB: April 15, 1961, 59 y.o.   MRN: [id number]    Chief complaint:  Coronary artery disease   HPI  59 y/o Caucasian male with CAD s/p LCx/OM PCI 04/2017, mod asympytomatic lt carotid stenosis, hypertension, hyperlipidemia.  Patient two episodes of retrosternal chest pain last week. One episode woke him up from sleep, lasted for about 15 min. He took 3 baby Aspirin. Another episode occurred the day after while he was driving, lasting for 5 min.  He has been under a lot of stress lately. He underwent left shoulder surgery a few months ago, but has had a lot of mobility issues thereafter. He is going to need redo surgery in the near future.    No current facility-administered medications on file prior to encounter.   Current Outpatient Medications on File Prior to Encounter  Medication Sig Dispense Refill  . aspirin 81 MG chewable tablet Chew 1 tablet (81 mg total) by mouth daily. 90 tablet 6  . cetirizine (ZYRTEC) 10 MG tablet Take 10 mg by mouth daily.     . diazepam (VALIUM) 5 MG tablet Take 5 mg by mouth every 6 (six) hours as needed (lower back spasms).    . DULoxetine (CYMBALTA) 20 MG capsule Take 20 mg by mouth at bedtime.     Marland Kitchen ezetimibe (ZETIA) 10 MG tablet Take 10 mg by mouth daily.    . fluticasone (FLONASE) 50 MCG/ACT nasal spray Place 1 spray into both nostrils 2 (two) times daily as needed (allergies/congestion.).     Marland Kitchen metoprolol succinate (TOPROL-XL) 25 MG 24 hr tablet TAKE 1.5 TABLETS (37.5 MG TOTAL) BY MOUTH DAILY. 135 tablet 2  . nitroGLYCERIN (NITROSTAT) 0.4 MG SL tablet Place 1 tablet (0.4 mg total) under the tongue every 5 (five) minutes as needed for chest pain. 90 tablet 3  . oxyCODONE-acetaminophen (PERCOCET) 5-325 MG tablet Take 1 tablet by mouth every 4 (four) hours as needed (max 6 q). 30 tablet 0  . pantoprazole (PROTONIX) 40 MG tablet Take 1 tablet (40 mg total) by mouth daily. 30  tablet 6  . Polyethyl Glycol-Propyl Glycol (SYSTANE ULTRA) 0.4-0.3 % SOLN Place 1-2 drops into both eyes 3 (three) times daily as needed (eye irritation).     . rosuvastatin (CRESTOR) 20 MG tablet Take 1 tablet (20 mg total) by mouth at bedtime. 30 tablet 11  . isosorbide mononitrate (IMDUR) 30 MG 24 hr tablet Take 1 tablet (30 mg total) by mouth daily. 30 tablet 3    Cardiovascular studies:  EKG 08/25/2019: Sinus  Rhythm  Right bundle branch block.  Left atrial enlargement.   Carotid artery duplex 15/17/6160:  Peak systolic velocities in the right bifurcation, internal, external and  common carotid arteries are within normal limits. Minimal plaque right  ICA.  Stenosis in the left internal carotid artery (16-49%), upper limit of  Dopplers with ICA/CCA ratio of 1.9. Stenosis in the left external carotid  artery (<50%).  Antegrade right vertebral artery flow. Antegrade left vertebral artery  flow.  Follow up in one year is appropriate if clinically indicated.  Coronary angiography 04/27/2017: Single vessel severe coronary arter disease OM1 tandem 90% & 60% stenoses with resting Pd/Pa 0.79. Successful percutaneous coronary intervention     PTCA and overlapping stents placement OM1 Synergy DES 3.5 X 12 mm & 3.0 X 12 mm distally  Echocardiogram 04/01/2017: Left ventricle cavity is normal in size. Normal  global wall motion. Normal diastolic filling pattern. Calculated EF 67%. Left atrial cavity is normal in size. 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 No evidence of pulmonary hypertension.   Recent labs: 08/02/2019: Chol 148, TG 245, HDL 40, LDL 64.   01/30/2019: Glucose 107. BUN/Cr 13/1.03. eGFR 79. Na/K 143/4.5. Rest of the CMP normal.  Chol 141, TG 172, HDL 43, LDL 64.   Review of Systems  Cardiovascular: Negative for chest pain, dyspnea on exertion, leg swelling, palpitations and syncope.    Vitals:   08/29/19  1222  BP: 112/73  Pulse: 73  Resp: (!) 99  SpO2: 98%     Objective:    Physical Exam  Constitutional: He appears well-developed and well-nourished.  Neck: No JVD present.  Cardiovascular: Normal rate, regular rhythm, normal heart sounds and intact distal pulses.  No murmur heard. Pulmonary/Chest: Effort normal and breath sounds normal. He has no wheezes. He has no rales.  Musculoskeletal:        General: No edema.  Nursing note and vitals reviewed.       Assessment & Recommendations:   59 y/o Caucasian male with CAD s/p LCx/OM PCI 04/2017, mod asympytomatic lt carotid stenosis, hypertension, hyperlipidemia.  CAD: Recent symptoms concerning for unstable angina.  I long discussion with the patient regarding medical versus medical plus invasive management.  Timing of this is crucial given his anticipated upcoming shoulder surgery.  If he does have severe obstructive CAD, his cardiac risk would be higher when he undergo shoulder surgery.  Coronary angiography will be the only definitive way to evaluate obstructive CAD, and potentially treated at the same time. With his upcoming shoulder surgery in mind, I will only perform intervention if severe stenosis found.  With resolute Onyx stent, hopefully, we could get by 1 month of dual antiplatelet therapy and proceed with shoulder surgery thereafter with relatively low risk of stent thrombosis.  Continue aspirin, crestor + zetia, metoprolol succinate. Added Imdur 30 mg daily. Low cardiac risk for shoulder surgeries/ epidural injection.  Hyperlipidemia: Significant improvement on crestor + zetia. Triglycerides elevated.  If he has obstructive CAD, I will add Vascepa 2 g twice daily.  Mod lt ICA stenosis: Asymptomatic. Continue aggressive medical management. Repeat carotid duplex in 6 months.   Nigel Mormon, MD [facility] Pager: [phone] Office: [phone] If no answer Cell [phone]

## 2019-08-30 ENCOUNTER — Other Ambulatory Visit: Payer: Self-pay | Admitting: Cardiology

## 2019-08-30 DIAGNOSIS — R9439 Abnormal result of other cardiovascular function study: Secondary | ICD-10-CM

## 2019-08-30 MED FILL — Lidocaine HCl Local Preservative Free (PF) Inj 1%: INTRAMUSCULAR | Qty: 30 | Status: AC

## 2019-08-30 NOTE — Telephone Encounter (Signed)
refill 

## 2019-09-08 ENCOUNTER — Telehealth: Payer: Managed Care, Other (non HMO) | Admitting: Cardiology

## 2019-09-08 DIAGNOSIS — E782 Mixed hyperlipidemia: Secondary | ICD-10-CM

## 2019-09-08 DIAGNOSIS — I10 Essential (primary) hypertension: Secondary | ICD-10-CM

## 2019-09-08 DIAGNOSIS — I2510 Atherosclerotic heart disease of native coronary artery without angina pectoris: Secondary | ICD-10-CM

## 2019-09-08 DIAGNOSIS — I6522 Occlusion and stenosis of left carotid artery: Secondary | ICD-10-CM

## 2019-09-08 NOTE — Progress Notes (Signed)
  Subjective:   William Mason, male    DOB: 02/22/1961, 59 y.o.   MRN: [id number]    Chief complaint:  Coronary artery disease   HPI  59 y/o Caucasian male with CAD s/p LCx/OM PCI 04/2017, mod asympytomatic lt carotid stenosis, hypertension, hyperlipidemia.  Given patient's symptoms concerning for unstable angina, he underwent coronary angiogram a week ago, which showed patent OM stents and otherwise unchanged mild to moderate coronary artery disease.  Patient has not had any significant testing results in the near described time remains his shoulder pain, for which he is hoping to undergo surgery in the next 1-2 months.  He has not had any pain or swelling at his right wrist.  Current Outpatient Medications on File Prior to Visit  Medication Sig Dispense Refill  . aspirin 81 MG chewable tablet Chew 1 tablet (81 mg total) by mouth daily. 90 tablet 6  . cetirizine (ZYRTEC) 10 MG tablet Take 10 mg by mouth daily.     . diazepam (VALIUM) 5 MG tablet Take 5 mg by mouth every 6 (six) hours as needed (lower back spasms).    . DULoxetine (CYMBALTA) 20 MG capsule Take 20 mg by mouth at bedtime.     . ezetimibe (ZETIA) 10 MG tablet Take 10 mg by mouth daily.    . fluticasone (FLONASE) 50 MCG/ACT nasal spray Place 1 spray into both nostrils 2 (two) times daily as needed (allergies/congestion.).     . isosorbide mononitrate (IMDUR) 30 MG 24 hr tablet Take 1 tablet (30 mg total) by mouth daily. 30 tablet 3  . metoprolol succinate (TOPROL-XL) 25 MG 24 hr tablet TAKE 1.5 TABLETS (37.5 MG TOTAL) BY MOUTH DAILY. 150 tablet 3  . nitroGLYCERIN (NITROSTAT) 0.4 MG SL tablet Place 1 tablet (0.4 mg total) under the tongue every 5 (five) minutes as needed for chest pain. 90 tablet 3  . oxyCODONE-acetaminophen (PERCOCET) 5-325 MG tablet Take 1 tablet by mouth every 4 (four) hours as needed (max 6 q). 30 tablet 0  . pantoprazole (PROTONIX) 40 MG tablet Take 1 tablet (40 mg total) by mouth daily. 30 tablet 6  .  Polyethyl Glycol-Propyl Glycol (SYSTANE ULTRA) 0.4-0.3 % SOLN Place 1-2 drops into both eyes 3 (three) times daily as needed (eye irritation).     . RABEprazole (ACIPHEX) 20 MG tablet Take 1 tablet (20 mg total) by mouth daily. 90 tablet 3  . rosuvastatin (CRESTOR) 20 MG tablet Take 1 tablet (20 mg total) by mouth at bedtime. 30 tablet 11   No current facility-administered medications on file prior to visit.    Cardiovascular studies:  Coronary angiography 08/29/2019: LM: Normal LAD: Prox and mid 20% focal stenoses LCx: Patent large OM2 stents. OM1 Synergy DES 3.0 X 12 mm & 3.5 X 12 mm. Rest normal        Pinched ostium of a small lateral branch at the stent site, with TIMI III flow. Unchanged compared to post PCI        angiogram in 2018.  RCA: Focal eccentric 40% stenosis. Unchanged compared to previous angiogram in 2018.  Normal LVEF and LVEDP   EKG 08/25/2019: Sinus  Rhythm  Right bundle branch block.  Left atrial enlargement.   Carotid artery duplex 08/16/2019:  Peak systolic velocities in the right bifurcation, internal, external and  common carotid arteries are within normal limits. Minimal plaque right  ICA.  Stenosis in the left internal carotid artery (16-49%), upper limit of  Dopplers with ICA/CCA ratio of   1.9. Stenosis in the left external carotid  artery (<50%).  Antegrade right vertebral artery flow. Antegrade left vertebral artery  flow.  Follow up in one year is appropriate if clinically indicated.  Echocardiogram 04/01/2017: Left ventricle cavity is normal in size. Normal global wall motion. Normal diastolic filling pattern. Calculated EF 67%. Left atrial cavity is normal in size. 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 No evidence of pulmonary hypertension.   Recent labs: 08/02/2019: Chol 148, TG 245, HDL 40, LDL 64.   01/30/2019: Glucose 107. BUN/Cr 13/1.03. eGFR 79. Na/K 143/4.5. Rest of the CMP  normal.  Chol 141, TG 172, HDL 43, LDL 64.   Review of Systems  Cardiovascular: Negative for chest pain, dyspnea on exertion, leg swelling, palpitations and syncope.   Vitals not available.  Objective:    Physical Exam  Not performed.  Telephone visit.     Assessment & Recommendations:   59 y/o Caucasian male with CAD s/p LCx/OM PCI 04/2017, mod asympytomatic lt carotid stenosis, hypertension, hyperlipidemia.  CAD: Coronary angiogram 2/9/201 shows patent OM stents and moderate disease in RCA and mild disease in LAD, unchanged from prior angiogram in 2018. Low cardiac risk for shoulder surgery. Recent symptoms concerning for unstable angina.  Continue aspirin, crestor + zetia, metoprolol succinate. Okay to stop Imdur.  Hyperlipidemia: Significant improvement on crestor + zetia. TG remains elevated. Will repeat lipid panel in 3 months. If TG remains elevated, will add Vascepa 2 g twice daily.  Mod lt ICA stenosis: Asymptomatic. Continue aggressive medical management. Repeat carotid duplex in 6 months.  Follow-up in June 2021.  Nigel Mormon, MD [facility] Pager: [phone] Office: [phone] If no answer Cell [phone]

## 2019-09-18 ENCOUNTER — Other Ambulatory Visit: Payer: Self-pay

## 2019-09-25 ENCOUNTER — Ambulatory Visit: Payer: Self-pay | Admitting: Cardiology

## 2019-10-17 ENCOUNTER — Telehealth: Payer: Self-pay

## 2019-10-17 NOTE — Telephone Encounter (Signed)
FYI :   VM 106 : Patient called and left a VM stating that he had a fall recently and landed on the edge of a log on the left side of his chest. He stated that he is in a lot of pain and his PCP office wanted him to at least inform us of what happened. He does not believe the pain is anything heart related, but he called as he was told to do so.

## 2019-10-18 NOTE — Telephone Encounter (Signed)
Tried calling the patient twice. No answer.

## 2019-10-19 NOTE — Telephone Encounter (Signed)
Tried calling patient again. NA, LMAM

## 2019-11-16 ENCOUNTER — Other Ambulatory Visit: Payer: Self-pay | Admitting: Cardiology

## 2019-11-16 DIAGNOSIS — I200 Unstable angina: Secondary | ICD-10-CM

## 2020-01-05 ENCOUNTER — Ambulatory Visit: Payer: Managed Care, Other (non HMO) | Admitting: Cardiology

## 2020-01-05 NOTE — Progress Notes (Deleted)
Subjective:   William Mason, male    DOB: 1960-10-24, 59 y.o.   MRN: [id number]    Chief complaint:  Coronary artery disease   HPI  59 y/o Caucasian male with CAD s/p LCx/OM PCI 04/2017, mod asympytomatic lt carotid stenosis, hypertension, hyperlipidemia.  Given patient's symptoms concerning for unstable angina, he underwent coronary angiogram on 08/29/2019, which showed patent OM stents and otherwise unchanged mild to moderate coronary artery disease.  ***Patient has not had any significant testing results in the near described time remains his shoulder pain, for which he is hoping to undergo surgery in the next 1-2 months.  He has not had any pain or swelling at his right wrist.  Current Outpatient Medications on File Prior to Visit  Medication Sig Dispense Refill  . aspirin 81 MG chewable tablet Chew 1 tablet (81 mg total) by mouth daily. 90 tablet 6  . cetirizine (ZYRTEC) 10 MG tablet Take 10 mg by mouth daily.     . diazepam (VALIUM) 5 MG tablet Take 5 mg by mouth every 6 (six) hours as needed (lower back spasms).    . DULoxetine (CYMBALTA) 20 MG capsule Take 20 mg by mouth at bedtime.     Marland Kitchen ezetimibe (ZETIA) 10 MG tablet Take 10 mg by mouth daily.    . fluticasone (FLONASE) 50 MCG/ACT nasal spray Place 1 spray into both nostrils 2 (two) times daily as needed (allergies/congestion.).     Marland Kitchen isosorbide mononitrate (IMDUR) 30 MG 24 hr tablet TAKE 1 TABLET BY MOUTH EVERY DAY 90 tablet 0  . metoprolol succinate (TOPROL-XL) 25 MG 24 hr tablet TAKE 1.5 TABLETS (37.5 MG TOTAL) BY MOUTH DAILY. 150 tablet 3  . nitroGLYCERIN (NITROSTAT) 0.4 MG SL tablet Place 1 tablet (0.4 mg total) under the tongue every 5 (five) minutes as needed for chest pain. 90 tablet 3  . oxyCODONE-acetaminophen (PERCOCET) 5-325 MG tablet Take 1 tablet by mouth every 4 (four) hours as needed (max 6 q). 30 tablet 0  . pantoprazole (PROTONIX) 40 MG tablet Take 1 tablet (40 mg total) by mouth daily. 30 tablet 6  .  Polyethyl Glycol-Propyl Glycol (SYSTANE ULTRA) 0.4-0.3 % SOLN Place 1-2 drops into both eyes 3 (three) times daily as needed (eye irritation).     . RABEprazole (ACIPHEX) 20 MG tablet Take 1 tablet (20 mg total) by mouth daily. 90 tablet 3  . rosuvastatin (CRESTOR) 20 MG tablet Take 1 tablet (20 mg total) by mouth at bedtime. 30 tablet 11   No current facility-administered medications on file prior to visit.    Cardiovascular studies:  Coronary angiography 08/29/2019: LM: Normal LAD: Prox and mid 20% focal stenoses LCx: Patent large OM2 stents. OM1 Synergy DES 3.0 X 12 mm & 3.5 X 12 mm. Rest normal        Pinched ostium of a small lateral branch at the stent site, with TIMI III flow. Unchanged compared to post PCI        angiogram in 2018.  RCA: Focal eccentric 40% stenosis. Unchanged compared to previous angiogram in 2018.  Normal LVEF and LVEDP   EKG 08/25/2019: Sinus  Rhythm  Right bundle branch block.  Left atrial enlargement.   Carotid artery duplex 36/46/8032:  Peak systolic velocities in the right bifurcation, internal, external and  common carotid arteries are within normal limits. Minimal plaque right  ICA.  Stenosis in the left internal carotid artery (16-49%), upper limit of  Dopplers with ICA/CCA ratio of 1.9. Stenosis in  the left external carotid  artery (<50%).  Antegrade right vertebral artery flow. Antegrade left vertebral artery  flow.  Follow up in one year is appropriate if clinically indicated.  Echocardiogram 04/01/2017: Left ventricle cavity is normal in size. Normal global wall motion. Normal diastolic filling pattern. Calculated EF 67%. Left atrial cavity is normal in size. 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 No evidence of pulmonary hypertension.   Recent labs: 08/02/2019: Chol 148, TG 245, HDL 40, LDL 64.   01/30/2019: Glucose 107. BUN/Cr 13/1.03. eGFR 79. Na/K 143/4.5. Rest of the CMP  normal.  Chol 141, TG 172, HDL 43, LDL 64.   Review of Systems  Cardiovascular: Negative for chest pain, dyspnea on exertion, leg swelling, palpitations and syncope.   *** There were no vitals filed for this visit.   Objective:    Physical Exam Vitals and nursing note reviewed.  Constitutional:      General: He is not in acute distress. Neck:     Vascular: No JVD.  Cardiovascular:     Rate and Rhythm: Normal rate and regular rhythm.     Pulses: Intact distal pulses.     Heart sounds: Normal heart sounds. No murmur heard.   Pulmonary:     Effort: Pulmonary effort is normal.     Breath sounds: Normal breath sounds. No wheezing or rales.         Assessment & Recommendations:   59 y/o Caucasian male with CAD s/p LCx/OM PCI 04/2017, mod asympytomatic lt carotid stenosis, hypertension, hyperlipidemia.  *** CAD: Coronary angiogram 2/9/201 shows patent OM stents and moderate disease in RCA and mild disease in LAD, unchanged from prior angiogram in 2018. Continue aspirin, crestor + zetia, metoprolol succinate. ***  Hyperlipidemia: Significant improvement on crestor + zetia. TG remains elevated. Will repeat lipid panel in 3 months. If TG remains elevated, will add Vascepa 2 g twice daily.  Mod lt ICA stenosis: Asymptomatic. Continue aggressive medical management. Repeat carotid duplex in 6 months.  Follow-up in June 2021.  Nigel Mormon, MD [facility] Pager: [phone] Office: [phone] If no answer Cell [phone]

## 2020-01-06 DIAGNOSIS — Z91199 Patient's noncompliance with other medical treatment and regimen due to unspecified reason: Secondary | ICD-10-CM | POA: Insufficient documentation

## 2020-01-25 ENCOUNTER — Other Ambulatory Visit: Payer: Managed Care, Other (non HMO)

## 2020-01-31 ENCOUNTER — Other Ambulatory Visit: Payer: Self-pay | Admitting: Cardiology

## 2020-01-31 ENCOUNTER — Other Ambulatory Visit: Payer: Self-pay

## 2020-01-31 ENCOUNTER — Ambulatory Visit: Payer: Managed Care, Other (non HMO)

## 2020-01-31 DIAGNOSIS — I6522 Occlusion and stenosis of left carotid artery: Secondary | ICD-10-CM

## 2020-02-01 ENCOUNTER — Ambulatory Visit: Payer: Managed Care, Other (non HMO) | Admitting: Cardiology

## 2020-02-04 ENCOUNTER — Other Ambulatory Visit: Payer: Self-pay | Admitting: Cardiology

## 2020-02-04 DIAGNOSIS — I6522 Occlusion and stenosis of left carotid artery: Secondary | ICD-10-CM

## 2020-03-06 ENCOUNTER — Other Ambulatory Visit: Payer: Self-pay | Admitting: Orthopedic Surgery

## 2020-03-06 DIAGNOSIS — M19012 Primary osteoarthritis, left shoulder: Secondary | ICD-10-CM

## 2020-03-19 ENCOUNTER — Other Ambulatory Visit: Payer: Managed Care, Other (non HMO)

## 2020-03-22 ENCOUNTER — Other Ambulatory Visit: Payer: Self-pay

## 2020-03-22 ENCOUNTER — Ambulatory Visit
Admission: RE | Admit: 2020-03-22 | Discharge: 2020-03-22 | Disposition: A | Discharge status: Home / Self Care | Payer: Managed Care, Other (non HMO) | Source: Ambulatory Visit | Attending: Orthopedic Surgery | Admitting: Orthopedic Surgery

## 2020-03-22 DIAGNOSIS — M19012 Primary osteoarthritis, left shoulder: Secondary | ICD-10-CM

## 2020-05-03 NOTE — Progress Notes (Signed)
Your procedure is scheduled on Thursday, October 21st.  Report to [facility] Main Entrance "A" at 5:30 A.M., and check in at the Admitting office.  Call this number if you have problems the morning of surgery:  [phone]  Call [phone] if you have any questions prior to your surgery date Monday-Friday 8am-4pm   Remember:  Do not eat after midnight the night before your surgery  You may drink clear liquids until 4:30 A.M. the morning of your surgery.   Clear liquids allowed are: Water, Non-Citrus Juices (without pulp), Carbonated Beverages, Clear Tea, Black Coffee Only, and Gatorade    Take these medicines the morning of surgery with A SIP OF WATER  cetirizine (ZYRTEC) ezetimibe (ZETIA)  isosorbide mononitrate (IMDUR)  metoprolol succinate (TOPROL-XL)  pantoprazole (PROTONIX)  RABEprazole (ACIPHEX)   If needed: diazepam (VALIUM), fluticasone (FLONASE)/nasal spray, nitroGLYCERIN (NITROSTAT), oxyCODONE-acetaminophen (PERCOCET), Polyethyl Glycol-Propyl Glycol (SYSTANE ULTRA)/eye drops  As of today, STOP taking any Aspirin (unless otherwise instructed by your surgeon) Aleve, Naproxen, Ibuprofen, Motrin, Advil, Goody's, BC's, all herbal medications, fish oil, and all vitamins.                     Do not wear jewelry.            Do not wear lotions, powders, colognes, or deodorant.            Men may shave face and neck.            Do not bring valuables to the hospital.            [facility] is not responsible for any belongings or valuables.  Do NOT Smoke (Tobacco/Vaping) or drink Alcohol 24 hours prior to your procedure If you use a CPAP at night, you may bring all equipment for your overnight stay.   Contacts, glasses, dentures or bridgework may not be worn into surgery.      For patients admitted to the hospital, discharge time will be determined by your treatment team.   Patients discharged the day of surgery will not be allowed to drive home, and someone needs to stay  with them for 24 hours.  Special instructions:   [facility]- Preparing For Surgery  Before surgery, you can play an important role. Because skin is not sterile, your skin needs to be as free of germs as possible. You can reduce the number of germs on your skin by washing with CHG (chlorahexidine gluconate) Soap before surgery.  CHG is an antiseptic cleaner which kills germs and bonds with the skin to continue killing germs even after washing.    Oral Hygiene is also important to reduce your risk of infection.  Remember - BRUSH YOUR TEETH THE MORNING OF SURGERY WITH YOUR REGULAR TOOTHPASTE  Please do not use if you have an allergy to CHG or antibacterial soaps. If your skin becomes reddened/irritated stop using the CHG.  Do not shave (including legs and underarms) for at least 48 hours prior to first CHG shower. It is OK to shave your face.  Please follow these instructions carefully.   1. Shower the NIGHT BEFORE SURGERY and the MORNING OF SURGERY with CHG Soap.   2. If you chose to wash your hair, wash your hair first as usual with your normal shampoo.  3. After you shampoo, rinse your hair and body thoroughly to remove the shampoo.  4. Use CHG as you would any other liquid soap. You can apply CHG directly to the  skin and wash gently with a scrungie or a clean washcloth.   5. Apply the CHG Soap to your body ONLY FROM THE NECK DOWN.  Do not use on open wounds or open sores. Avoid contact with your eyes, ears, mouth and genitals (private parts). Wash Face and genitals (private parts)  with your normal soap.   6. Wash thoroughly, paying special attention to the area where your surgery will be performed.  7. Thoroughly rinse your body with warm water from the neck down.  8. DO NOT shower/wash with your normal soap after using and rinsing off the CHG Soap.  9. Pat yourself dry with a CLEAN TOWEL.  10. Wear CLEAN PAJAMAS to bed the night before surgery  11. Place CLEAN SHEETS on your bed  the night of your first shower and DO NOT SLEEP WITH PETS.  Day of Surgery: Wear Clean/Comfortable clothing the morning of surgery Do not apply any deodorants/lotions.   Remember to brush your teeth WITH YOUR REGULAR TOOTHPASTE.   Please read over the following fact sheets that you were given.

## 2020-05-06 ENCOUNTER — Encounter (HOSPITAL_COMMUNITY)
Admission: RE | Admit: 2020-05-06 | Discharge: 2020-05-06 | Disposition: A | Discharge status: Home / Self Care | Payer: Managed Care, Other (non HMO) | Source: Ambulatory Visit | Attending: Orthopedic Surgery | Admitting: Orthopedic Surgery

## 2020-05-06 ENCOUNTER — Encounter (HOSPITAL_COMMUNITY): Payer: Self-pay

## 2020-05-06 ENCOUNTER — Other Ambulatory Visit (HOSPITAL_COMMUNITY)
Admission: RE | Admit: 2020-05-06 | Discharge: 2020-05-06 | Disposition: A | Discharge status: Home / Self Care | Payer: Managed Care, Other (non HMO) | Source: Ambulatory Visit | Attending: Orthopedic Surgery | Admitting: Orthopedic Surgery

## 2020-05-06 ENCOUNTER — Other Ambulatory Visit: Payer: Self-pay

## 2020-05-06 DIAGNOSIS — Z955 Presence of coronary angioplasty implant and graft: Secondary | ICD-10-CM | POA: Insufficient documentation

## 2020-05-06 DIAGNOSIS — G4733 Obstructive sleep apnea (adult) (pediatric): Secondary | ICD-10-CM | POA: Insufficient documentation

## 2020-05-06 DIAGNOSIS — I10 Essential (primary) hypertension: Secondary | ICD-10-CM | POA: Insufficient documentation

## 2020-05-06 DIAGNOSIS — I6529 Occlusion and stenosis of unspecified carotid artery: Secondary | ICD-10-CM | POA: Diagnosis not present

## 2020-05-06 DIAGNOSIS — Z20822 Contact with and (suspected) exposure to covid-19: Secondary | ICD-10-CM | POA: Insufficient documentation

## 2020-05-06 DIAGNOSIS — E785 Hyperlipidemia, unspecified: Secondary | ICD-10-CM | POA: Insufficient documentation

## 2020-05-06 DIAGNOSIS — I2510 Atherosclerotic heart disease of native coronary artery without angina pectoris: Secondary | ICD-10-CM | POA: Diagnosis not present

## 2020-05-06 DIAGNOSIS — Z79899 Other long term (current) drug therapy: Secondary | ICD-10-CM | POA: Insufficient documentation

## 2020-05-06 DIAGNOSIS — Z9989 Dependence on other enabling machines and devices: Secondary | ICD-10-CM | POA: Insufficient documentation

## 2020-05-06 DIAGNOSIS — Z01812 Encounter for preprocedural laboratory examination: Secondary | ICD-10-CM | POA: Insufficient documentation

## 2020-05-06 LAB — BASIC METABOLIC PANEL
Anion gap: 10 (ref 5–15)
BUN: 12 mg/dL (ref 8–23)
CO2: 25 mmol/L (ref 22–32)
Calcium: 9 mg/dL (ref 8.9–10.3)
Chloride: 106 mmol/L (ref 98–111)
Creatinine, Ser: 1.02 mg/dL (ref 0.61–1.24)
GFR, Estimated: >60 mL/min (ref >60)
Glucose, Bld: 131 mg/dL — ABNORMAL HIGH (ref 70–99)
Potassium: 3.8 mmol/L (ref 3.5–5.1)
Sodium: 141 mmol/L (ref 135–145)

## 2020-05-06 LAB — TYPE AND SCREEN
ABO/RH(D): O POS
Antibody Screen: NEGATIVE

## 2020-05-06 LAB — CBC
HCT: 47.2 % (ref 39.0–52.0)
Hemoglobin: 15.2 g/dL (ref 13.0–17.0)
MCH: 29.9 pg (ref 26.0–34.0)
MCHC: 32.2 g/dL (ref 30.0–36.0)
MCV: 92.7 fL (ref 80.0–100.0)
Platelets: 185 10*3/uL (ref 150–400)
RBC: 5.09 MIL/uL (ref 4.22–5.81)
RDW: 12.9 % (ref 11.5–15.5)
WBC: 5.7 10*3/uL (ref 4.0–10.5)
nRBC: 0 % (ref 0.0–0.2)

## 2020-05-06 LAB — SURGICAL PCR SCREEN
MRSA, PCR: POSITIVE — AB
Staphylococcus aureus: POSITIVE — AB

## 2020-05-06 LAB — SARS CORONAVIRUS 2 (TAT 6-24 HRS): SARS Coronavirus 2: NEGATIVE

## 2020-05-06 NOTE — Progress Notes (Addendum)
PCP - Arlys John Cardiologist - [place], M   Chest x-ray - not needed EKG - 08/25/19 Stress Test - 2011 ECHO - 2011 Cardiac Cath - 08/29/19   ERAS Protcol - yes, Clears until 0430 am PRE-SURGERY Ensure or G2- Ensure Given  COVID TEST- 05/06/20 pending  LD Aspirin 05/06/20, patient is to stop aspirin  Anesthesia review: yes, previous cardiac testing, Dr Stann Mainland office contacted for aspirin instructions Left Message for Sherrie   Patient denies shortness of breath, fever, cough and chest pain at PAT appointment   All instructions explained to the patient, with a verbal understanding of the material. Patient agrees to go over the instructions while at home for a better understanding. Patient also instructed to self quarantine after being tested for COVID-19. The opportunity to ask questions was provided.

## 2020-05-06 NOTE — Progress Notes (Addendum)
Your procedure is scheduled on Thursday, October 21st.  Report to [facility] Main Entrance "A" at 5:30 A.M., and check in at the Admitting office.  Call this number if you have problems the morning of surgery:  [phone]  Call [phone] if you have any questions prior to your surgery date Monday-Friday 8am-4pm   Remember:  Do not eat after midnight the night before your surgery  You may drink clear liquids until 4:30 A.M. the morning of your surgery.   Clear liquids allowed are: Water, Non-Citrus Juices (without pulp), Carbonated Beverages, Clear Tea, Black Coffee Only, and Gatorade   Enhanced Recovery after Surgery for Orthopedics Enhanced Recovery after Surgery is a protocol used to improve the stress on your body and your recovery after surgery.  Patient Instructions  . The night before surgery:  o No food after midnight. ONLY clear liquids after midnight  .  Marland Kitchen The day of surgery (if you do NOT have diabetes):  o Drink ONE (1) Pre-Surgery Clear Ensure by __0430 am___ am the morning of surgery   o This drink was given to you during your hospital  pre-op appointment visit. o Nothing else to drink after completing the  Pre-Surgery Clear Ensure.         If you have questions, please contact your surgeon's office.     Take these medicines the morning of surgery with A SIP OF WATER  cetirizine (ZYRTEC) ezetimibe (ZETIA)  isosorbide mononitrate (IMDUR)  metoprolol succinate (TOPROL-XL)  pantoprazole (PROTONIX)  RABEprazole (ACIPHEX)  diazepam (VALIUM) If needed fluticasone (FLONASE) if needed oxyCODONE-acetaminophen (PERCOCET) if needed Eye drops  Follow your surgeon's instructions on when to stop Aspirin.  If no instructions were given by your surgeon then you will need to call the office to get those instructions.    As of today, STOP taking any Aspirin (unless otherwise instructed by your surgeon) Aleve, Naproxen, Ibuprofen, Motrin, Advil, Goody's, BC's, all  herbal medications, fish oil, and all vitamins.                     Do not wear jewelry.            Do not wear lotions, powders, colognes, or deodorant.            Men may shave face and neck.            Do not bring valuables to the hospital.            [facility] is not responsible for any belongings or valuables.  Do NOT Smoke (Tobacco/Vaping) or drink Alcohol 24 hours prior to your procedure If you use a CPAP at night, you may bring all equipment for your overnight stay.   Contacts, glasses, dentures or bridgework may not be worn into surgery.      For patients admitted to the hospital, discharge time will be determined by your treatment team.   Patients discharged the day of surgery will not be allowed to drive home, and someone needs to stay with them for 24 hours.  Special instructions:    [place]- Preparing for Total Shoulder Arthroplasty  Before surgery, you can play an important role. Because skin is not sterile, your skin needs to be as free of germs as possible. You can reduce the number of germs on your skin by using the following products.   Benzoyl Peroxide Gel  o Reduces the number of germs present on the skin  o Applied twice a  day to shoulder area starting two days before surgery   Chlorhexidine Gluconate (CHG) Soap (instructions listed above on how to wash with CHG Soap)  o An antiseptic cleaner that kills germs and bonds with the skin to continue killing germs even after washing  o Used for showering the night before surgery and morning of surgery   ==================================================================  Please follow these instructions carefully:  BENZOYL PEROXIDE 5% GEL  Please do not use if you have an allergy to benzoyl peroxide. If your skin becomes reddened/irritated stop using the benzoyl peroxide.  Starting two days before surgery, apply as follows:  1. Apply benzoyl peroxide in the morning and at night. Apply after taking  a shower. If you are not taking a shower clean entire shoulder front, back, and side along with the armpit with a clean wet washcloth.  2. Place a quarter-sized dollop on your SHOULDER and rub in thoroughly, making sure to cover the front, back, and side of your shoulder, along with the armpit.   2 Days prior to Surgery First Dose on ___10/19__________ Morning Second Dose on ____10/19__________ Night  Day Before Surgery First Dose on ____10/20__________ Morning Night before surgery wash (entire body except face and private areas) with CHG Soap THEN Second Dose on ____10/20________ Night   Morning of Surgery  wash BODY AGAIN with CHG Soap   4. Do NOT apply benzoyl peroxide gel on the day of surgery - Preparing For Surgery  Before surgery, you can play an important role. Because skin is not sterile, your skin needs to be as free of germs as possible. You can reduce the number of germs on your skin by washing with CHG (chlorahexidine gluconate) Soap before surgery.  CHG is an antiseptic cleaner which kills germs and bonds with the skin to continue killing germs even after washing.    Oral Hygiene is also important to reduce your risk of infection.  Remember - BRUSH YOUR TEETH THE MORNING OF SURGERY WITH YOUR REGULAR TOOTHPASTE  Please do not use if you have an allergy to CHG or antibacterial soaps. If your skin becomes reddened/irritated stop using the CHG.  Do not shave (including legs and underarms) for at least 48 hours prior to first CHG shower. It is OK to shave your face.  Please follow these instructions carefully.   1. Shower the NIGHT BEFORE SURGERY and the MORNING OF SURGERY with CHG Soap.   2. If you chose to wash your hair, wash your hair first as usual with your normal shampoo.  3. After you shampoo, rinse your hair and body thoroughly to remove the shampoo.  4. Use CHG as you would any other liquid soap. You can apply CHG directly to the skin and wash gently  with a scrungie or a clean washcloth.   5. Apply the CHG Soap to your body ONLY FROM THE NECK DOWN.  Do not use on open wounds or open sores. Avoid contact with your eyes, ears, mouth and genitals (private parts). Wash Face and genitals (private parts)  with your normal soap.   6. Wash thoroughly, paying special attention to the area where your surgery will be performed.  7. Thoroughly rinse your body with warm water from the neck down.  8. DO NOT shower/wash with your normal soap after using and rinsing off the CHG Soap.  9. Pat yourself dry with a CLEAN TOWEL.  10. Wear CLEAN PAJAMAS to bed the night before surgery  11. Place CLEAN SHEETS on your  bed the night of your first shower and DO NOT SLEEP WITH PETS.  Day of Surgery: Wear Clean/Comfortable clothing the morning of surgery Do not apply any deodorants/lotions.   Remember to brush your teeth WITH YOUR REGULAR TOOTHPASTE.   Please read over the following fact sheets that you were given.

## 2020-05-07 NOTE — Anesthesia Preprocedure Evaluation (Addendum)
Anesthesia Evaluation  Patient identified by MRN, date of birth, ID band Patient awake    Reviewed: Allergy & Precautions, NPO status , Patient's Chart, lab work & pertinent test results  Airway Mallampati: II  TM Distance: >3 FB Neck ROM: Full    Dental no notable dental hx.    Pulmonary sleep apnea and Continuous Positive Airway Pressure Ventilation ,    Pulmonary exam normal breath sounds clear to auscultation       Cardiovascular hypertension, + CAD   Rhythm:Regular Rate:Normal  Carotid stenosis   Neuro/Psych PSYCHIATRIC DISORDERS Anxiety Depression    GI/Hepatic Neg liver ROS, hiatal hernia, GERD  ,  Endo/Other  obesity  Renal/GU negative Renal ROS     Musculoskeletal  (+) Arthritis ,   Abdominal (+) + obese,   Peds negative pediatric ROS (+)  Hematology negative hematology ROS (+)   Anesthesia Other Findings Chronic pain  Reproductive/Obstetrics negative OB ROS                                                            Anesthesia Evaluation  Patient identified by MRN, date of birth, ID band Patient awake    Reviewed: Allergy & Precautions, NPO status , Patient's Chart, lab work & pertinent test results, reviewed documented beta blocker date and time   History of Anesthesia Complications (+) Family history of anesthesia reaction  Airway Mallampati: II  TM Distance: >3 FB     Dental no notable dental hx. (+) Teeth Intact, Caps, Dental Advisory Given   Pulmonary sleep apnea and Continuous Positive Airway Pressure Ventilation ,    Pulmonary exam normal breath sounds clear to auscultation       Cardiovascular hypertension, + angina with exertion + CAD, + Cardiac Stents and + Peripheral Vascular Disease  Normal cardiovascular exam Rhythm:Regular Rate:Normal  Left Carotid stenosis LCx/OM PCI  04/27/2017 Cardiac cath Single vessel severe coronary arter  disease OM1 tandem 90% & 60% stenoses with resting Pd/Pa 0.79 Successful percutaneous coronary intervention      PTCA and overlapping stents placement OM1     Synergy DES 3.5 X 12 mm & 3.0 X 12 mm distally  EKG NSR, RBBB pattern, RVH  Carotid doppler 03/25/7892 RICA <81%  LICA 01-75%   Neuro/Psych PSYCHIATRIC DISORDERS Anxiety Depression negative neurological ROS     GI/Hepatic hiatal hernia, GERD  Medicated and Controlled,  Endo/Other  diabetes, Well Controlled, Type 2Hyperlipidemia  Renal/GU   negative genitourinary   Musculoskeletal  (+) Arthritis , Osteoarthritis,  Left shoulder arthritis   Abdominal   Peds  Hematology negative hematology ROS (+)   Anesthesia Other Findings   Reproductive/Obstetrics                          Anesthesia Physical Anesthesia Plan  ASA: III  Anesthesia Plan: General   Post-op Pain Management:  Regional for Post-op pain   Induction: Intravenous  PONV Risk Score and Plan: 3 and Midazolam, Ondansetron, Treatment may vary due to age or medical condition and Dexamethasone  Airway Management Planned: Oral ETT  Additional Equipment:   Intra-op Plan:   Post-operative Plan: Extubation in OR  Informed Consent: I have reviewed the patients History and Physical, chart, labs and discussed the procedure including the risks, benefits  and alternatives for the proposed anesthesia with the patient or authorized representative who has indicated his/her understanding and acceptance.     Dental advisory given  Plan Discussed with: CRNA and Surgeon  Anesthesia Plan Comments: (See APP note by Durel Salts, FNP)      Anesthesia Quick Evaluation  Anesthesia Physical Anesthesia Plan  ASA: III  Anesthesia Plan: General and Regional   Post-op Pain Management: GA combined w/ Regional for post-op pain   Induction: Intravenous  PONV Risk Score and Plan: 2 and Midazolam, Dexamethasone and Ondansetron  Airway  Management Planned: Oral ETT  Additional Equipment:   Intra-op Plan:   Post-operative Plan: Extubation in OR  Informed Consent: I have reviewed the patients History and Physical, chart, labs and discussed the procedure including the risks, benefits and alternatives for the proposed anesthesia with the patient or authorized representative who has indicated his/her understanding and acceptance.     Dental advisory given  Plan Discussed with: CRNA and Anesthesiologist  Anesthesia Plan Comments: (Interscalene block with exparel. GETA    PAT note by Karoline Caldwell, PA-C: Follows with cardiology for hx of CAD s/p LCx/OM PCI 04/2017, mod asympytomatic lt carotid stenosis, hypertension, hyperlipidemia. last seen by Dr. Virgina Jock 09/08/2019.  He was doing well at that time and was cleared for upcoming shoulder surgery (which ended up being delayed at that time).  Per note, "Coronary angiogram 2/9/201 shows patent OM stents and moderate disease in RCA and mild disease in LAD, unchanged from prior angiogram in 2018. Low cardiac risk for shoulder surgery."  He subsequently underwent follow-up carotid duplex 01/31/2020 which was stable, recommended recheck in 1 year.    Dr. Virgina Jock again cleared the patient for surgery on 03/08/2020.  Copy of clearance on chart.  OSA on CPAP.  Preop labs reviewed, unremarkable.  EKG 08/25/2019: Sinus  Rhythm.  Rate 61. Right bundle branch block. Left atrial enlargement.   Carotid artery duplex 01/31/2020:  Minimal stenosis in the right internal carotid artery (minimal).  Stenosis in the left internal carotid artery (16-49%). Stenosis in the  left external carotid artery (<50%).  Antegrade right vertebral artery flow. Antegrade left vertebral artery  flow.  Follow up in one year is appropriate if clinically indicated. No  significant change from 01/26/2019.  Left heart cath 08/29/2019: The left ventricular systolic function is normal. The left ventricular  ejection fraction is greater than 65% by visual estimate. LV end diastolic pressure is normal.   LM: Normal LAD: Prox and mid 20% focal stenoses LCx: Patent large OM2 stents. Rest normal        Pinched ostium of a small lateral branch at the stent site, with TIMI III flow. Unchanged compared to post PCI        angiogram in 2018.  RCA: Focal eccentric 40% stenosis. Unchanged compared to previous angiogram in 2018.  Normal LVEF and LVEDP  Continue medical management for CAD. I do not think his episode of chest pain at night was related to an acute coronary event. Low cardiac risk for upcoming shoulder surgery.  )     Anesthesia Quick Evaluation

## 2020-05-07 NOTE — Progress Notes (Addendum)
Anesthesia Chart Review:  Follows with cardiology for hx of CAD s/p LCx/OM PCI 04/2017, mod asympytomatic lt carotid stenosis, hypertension, hyperlipidemia. last seen by Dr. Virgina Jock 09/08/2019.  He was doing well at that time and was cleared for upcoming shoulder surgery (which ended up being delayed at that time).  Per note, "Coronary angiogram 2/9/201 shows patent OM stents and moderate disease in RCA and mild disease in LAD, unchanged from prior angiogram in 2018. Low cardiac risk for shoulder surgery."  He subsequently underwent follow-up carotid duplex 01/31/2020 which was stable, recommended recheck in 1 year.    Dr. Virgina Jock again cleared the patient for surgery on 03/08/2020.  Copy of clearance on chart.  OSA on CPAP.  Preop labs reviewed, unremarkable.  EKG 08/25/2019: Sinus  Rhythm.  Rate 61. Right bundle branch block. Left atrial enlargement.   Carotid artery duplex 01/31/2020:  Minimal stenosis in the right internal carotid artery (minimal).  Stenosis in the left internal carotid artery (16-49%). Stenosis in the  left external carotid artery (<50%).  Antegrade right vertebral artery flow. Antegrade left vertebral artery  flow.  Follow up in one year is appropriate if clinically indicated. No  significant change from 01/26/2019.  Left heart cath 08/29/2019:  The left ventricular systolic function is normal.  The left ventricular ejection fraction is greater than 65% by visual estimate.  LV end diastolic pressure is normal.   LM: Normal LAD: Prox and mid 20% focal stenoses LCx: Patent large OM2 stents. Rest normal        Pinched ostium of a small lateral branch at the stent site, with TIMI III flow. Unchanged compared to post PCI        angiogram in 2018.  RCA: Focal eccentric 40% stenosis. Unchanged compared to previous angiogram in 2018.  Normal LVEF and LVEDP  Continue medical management for CAD. I do not think his episode of chest pain at night was related to an  acute coronary event. Low cardiac risk for upcoming shoulder surgery.   Wynonia Musty [facility] Short Stay Center/Anesthesiology Phone [phone] 05/07/2020 3:31 PM

## 2020-05-08 ENCOUNTER — Encounter (HOSPITAL_COMMUNITY): Payer: Self-pay | Admitting: Orthopedic Surgery

## 2020-05-09 ENCOUNTER — Observation Stay (HOSPITAL_COMMUNITY): Payer: Managed Care, Other (non HMO)

## 2020-05-09 ENCOUNTER — Other Ambulatory Visit: Payer: Self-pay

## 2020-05-09 ENCOUNTER — Ambulatory Visit (HOSPITAL_COMMUNITY): Payer: Managed Care, Other (non HMO) | Admitting: Vascular Surgery

## 2020-05-09 ENCOUNTER — Ambulatory Visit (HOSPITAL_COMMUNITY): Payer: Managed Care, Other (non HMO) | Admitting: Certified Registered"

## 2020-05-09 ENCOUNTER — Observation Stay (HOSPITAL_COMMUNITY)
Admission: RE | Admit: 2020-05-09 | Discharge: 2020-05-10 | Disposition: A | Discharge status: Home / Self Care | Payer: Managed Care, Other (non HMO) | Source: Ambulatory Visit | Attending: Orthopedic Surgery | Admitting: Orthopedic Surgery

## 2020-05-09 ENCOUNTER — Encounter (HOSPITAL_COMMUNITY): Payer: Self-pay | Admitting: Orthopedic Surgery

## 2020-05-09 ENCOUNTER — Encounter (HOSPITAL_COMMUNITY)
Admission: RE | Disposition: A | Discharge status: Home / Self Care | Payer: Self-pay | Source: Ambulatory Visit | Attending: Orthopedic Surgery

## 2020-05-09 DIAGNOSIS — T84098A Other mechanical complication of other internal joint prosthesis, initial encounter: Secondary | ICD-10-CM | POA: Diagnosis present

## 2020-05-09 DIAGNOSIS — M25512 Pain in left shoulder: Secondary | ICD-10-CM | POA: Diagnosis not present

## 2020-05-09 DIAGNOSIS — I2510 Atherosclerotic heart disease of native coronary artery without angina pectoris: Secondary | ICD-10-CM | POA: Diagnosis not present

## 2020-05-09 DIAGNOSIS — Z79899 Other long term (current) drug therapy: Secondary | ICD-10-CM | POA: Insufficient documentation

## 2020-05-09 DIAGNOSIS — Y838 Other surgical procedures as the cause of abnormal reaction of the patient, or of later complication, without mention of misadventure at the time of the procedure: Secondary | ICD-10-CM | POA: Diagnosis not present

## 2020-05-09 DIAGNOSIS — G8929 Other chronic pain: Secondary | ICD-10-CM | POA: Insufficient documentation

## 2020-05-09 DIAGNOSIS — Z96612 Presence of left artificial shoulder joint: Secondary | ICD-10-CM

## 2020-05-09 DIAGNOSIS — M25519 Pain in unspecified shoulder: Secondary | ICD-10-CM | POA: Diagnosis present

## 2020-05-09 DIAGNOSIS — Z955 Presence of coronary angioplasty implant and graft: Secondary | ICD-10-CM | POA: Diagnosis not present

## 2020-05-09 DIAGNOSIS — I10 Essential (primary) hypertension: Secondary | ICD-10-CM | POA: Insufficient documentation

## 2020-05-09 HISTORY — PX: REVISION TOTAL SHOULDER TO REVERSE TOTAL SHOULDER: SHX6313

## 2020-05-09 LAB — ABO/RH: ABO/RH(D): O POS

## 2020-05-09 SURGERY — REVISION, REVERSE TOTAL ARTHROPLASTY, SHOULDER
Anesthesia: Regional | Site: Shoulder | Laterality: Left

## 2020-05-09 MED ORDER — VANCOMYCIN HCL IN DEXTROSE 1-5 GM/200ML-% IV SOLN
1000.0000 mg | Freq: Once | INTRAVENOUS | Status: AC
  Administered 2020-05-09: 1500 mg via INTRAVENOUS

## 2020-05-09 MED ORDER — ASPIRIN 81 MG PO CHEW
81.0000 mg | CHEWABLE_TABLET | Freq: Every day | ORAL | Status: DC
  Administered 2020-05-10: 81 mg via ORAL
  Filled 2020-05-09 (×2): qty 1

## 2020-05-09 MED ORDER — PROPOFOL 10 MG/ML IV BOLUS
INTRAVENOUS | Status: AC
  Filled 2020-05-09: qty 40

## 2020-05-09 MED ORDER — BUPIVACAINE LIPOSOME 1.3 % IJ SUSP
INTRAMUSCULAR | Status: DC | PRN
  Administered 2020-05-09: 10 mL

## 2020-05-09 MED ORDER — OXYCODONE HCL 5 MG PO TABS
10.0000 mg | ORAL_TABLET | ORAL | Status: DC | PRN
  Administered 2020-05-09: 15 mg via ORAL
  Filled 2020-05-09: qty 3

## 2020-05-09 MED ORDER — CHLORHEXIDINE GLUCONATE 0.12 % MT SOLN: 15.0000 mL | Freq: Once | OROMUCOSAL | Status: AC

## 2020-05-09 MED ORDER — DOCUSATE SODIUM 100 MG PO CAPS
100.0000 mg | ORAL_CAPSULE | Freq: Two times a day (BID) | ORAL | Status: DC
  Administered 2020-05-09 – 2020-05-10 (×3): 100 mg via ORAL
  Filled 2020-05-09 (×3): qty 1

## 2020-05-09 MED ORDER — HYDROMORPHONE HCL 1 MG/ML IJ SOLN: 0.5000 mg | INTRAMUSCULAR | Status: DC | PRN

## 2020-05-09 MED ORDER — FENTANYL CITRATE (PF) 100 MCG/2ML IJ SOLN
INTRAMUSCULAR | Status: DC | PRN
  Administered 2020-05-09 (×2): 50 ug via INTRAVENOUS

## 2020-05-09 MED ORDER — VANCOMYCIN HCL IN DEXTROSE 1-5 GM/200ML-% IV SOLN: 1000.0000 mg | Freq: Once | INTRAVENOUS | Status: DC

## 2020-05-09 MED ORDER — ONDANSETRON HCL 4 MG/2ML IJ SOLN: 4.0000 mg | Freq: Four times a day (QID) | INTRAMUSCULAR | Status: DC | PRN

## 2020-05-09 MED ORDER — VANCOMYCIN HCL 1 G IV SOLR
INTRAVENOUS | Status: DC | PRN
  Administered 2020-05-09: 1000 mg via TOPICAL

## 2020-05-09 MED ORDER — ACETAMINOPHEN 500 MG PO TABS: 1000.0000 mg | ORAL_TABLET | Freq: Once | ORAL | Status: AC

## 2020-05-09 MED ORDER — ACETAMINOPHEN 325 MG PO TABS: 325.0000 mg | ORAL_TABLET | Freq: Four times a day (QID) | ORAL | Status: DC | PRN

## 2020-05-09 MED ORDER — ACETAMINOPHEN 500 MG PO TABS
1000.0000 mg | ORAL_TABLET | Freq: Four times a day (QID) | ORAL | Status: AC
  Administered 2020-05-09 – 2020-05-10 (×3): 1000 mg via ORAL
  Filled 2020-05-09 (×3): qty 2

## 2020-05-09 MED ORDER — DEXAMETHASONE SODIUM PHOSPHATE 10 MG/ML IJ SOLN
INTRAMUSCULAR | Status: DC | PRN
  Administered 2020-05-09: 5 mg via INTRAVENOUS

## 2020-05-09 MED ORDER — MIDAZOLAM HCL 5 MG/5ML IJ SOLN
INTRAMUSCULAR | Status: DC | PRN
  Administered 2020-05-09: 2 mg via INTRAVENOUS

## 2020-05-09 MED ORDER — EPINEPHRINE PF 1 MG/ML IJ SOLN
INTRAMUSCULAR | Status: AC
  Filled 2020-05-09: qty 1

## 2020-05-09 MED ORDER — LORATADINE 10 MG PO TABS
10.0000 mg | ORAL_TABLET | Freq: Every day | ORAL | Status: DC
  Administered 2020-05-10: 10 mg via ORAL
  Filled 2020-05-09 (×2): qty 1

## 2020-05-09 MED ORDER — SUCCINYLCHOLINE CHLORIDE 200 MG/10ML IV SOSY
PREFILLED_SYRINGE | INTRAVENOUS | Status: DC | PRN
  Administered 2020-05-09: 100 mg via INTRAVENOUS

## 2020-05-09 MED ORDER — FLUTICASONE PROPIONATE 50 MCG/ACT NA SUSP
1.0000 | Freq: Every day | NASAL | Status: DC
  Filled 2020-05-09: qty 16

## 2020-05-09 MED ORDER — VANCOMYCIN HCL IN DEXTROSE 1-5 GM/200ML-% IV SOLN
INTRAVENOUS | Status: AC
  Filled 2020-05-09: qty 200

## 2020-05-09 MED ORDER — PROPOFOL 10 MG/ML IV BOLUS
INTRAVENOUS | Status: DC | PRN
  Administered 2020-05-09: 150 mg via INTRAVENOUS

## 2020-05-09 MED ORDER — PHENOL 1.4 % MT LIQD: 1.0000 | OROMUCOSAL | Status: DC | PRN

## 2020-05-09 MED ORDER — METOCLOPRAMIDE HCL 5 MG/ML IJ SOLN: 5.0000 mg | Freq: Three times a day (TID) | INTRAMUSCULAR | Status: DC | PRN

## 2020-05-09 MED ORDER — ONDANSETRON HCL 4 MG PO TABS: 4.0000 mg | ORAL_TABLET | Freq: Four times a day (QID) | ORAL | Status: DC | PRN

## 2020-05-09 MED ORDER — CEFAZOLIN SODIUM-DEXTROSE 1-4 GM/50ML-% IV SOLN
1.0000 g | Freq: Four times a day (QID) | INTRAVENOUS | Status: AC
  Administered 2020-05-09 – 2020-05-10 (×3): 1 g via INTRAVENOUS
  Filled 2020-05-09 (×3): qty 50

## 2020-05-09 MED ORDER — AMISULPRIDE (ANTIEMETIC) 5 MG/2ML IV SOLN: 10.0000 mg | Freq: Once | INTRAVENOUS | Status: DC | PRN

## 2020-05-09 MED ORDER — CHLORHEXIDINE GLUCONATE 0.12 % MT SOLN
OROMUCOSAL | Status: AC
  Administered 2020-05-09: 15 mL via OROMUCOSAL
  Filled 2020-05-09: qty 15

## 2020-05-09 MED ORDER — LIDOCAINE 2% (20 MG/ML) 5 ML SYRINGE
INTRAMUSCULAR | Status: DC | PRN
  Administered 2020-05-09: 20 mg via INTRAVENOUS

## 2020-05-09 MED ORDER — MENTHOL 3 MG MT LOZG: 1.0000 | LOZENGE | OROMUCOSAL | Status: DC | PRN

## 2020-05-09 MED ORDER — EZETIMIBE 10 MG PO TABS
10.0000 mg | ORAL_TABLET | Freq: Every day | ORAL | Status: DC
  Administered 2020-05-10: 10 mg via ORAL
  Filled 2020-05-09 (×2): qty 1

## 2020-05-09 MED ORDER — ORAL CARE MOUTH RINSE: 15.0000 mL | Freq: Once | OROMUCOSAL | Status: AC

## 2020-05-09 MED ORDER — OXYCODONE HCL 5 MG PO TABS
5.0000 mg | ORAL_TABLET | ORAL | Status: DC | PRN
  Administered 2020-05-09: 5 mg via ORAL
  Filled 2020-05-09: qty 1

## 2020-05-09 MED ORDER — MIDAZOLAM HCL 2 MG/2ML IJ SOLN
INTRAMUSCULAR | Status: AC
  Filled 2020-05-09: qty 2

## 2020-05-09 MED ORDER — LACTATED RINGERS IV SOLN: INTRAVENOUS | Status: DC

## 2020-05-09 MED ORDER — NITROGLYCERIN 0.4 MG SL SUBL: 0.4000 mg | SUBLINGUAL_TABLET | SUBLINGUAL | Status: DC | PRN

## 2020-05-09 MED ORDER — VANCOMYCIN HCL 1000 MG IV SOLR: INTRAVENOUS | Status: DC | PRN

## 2020-05-09 MED ORDER — PHENYLEPHRINE HCL-NACL 10-0.9 MG/250ML-% IV SOLN
INTRAVENOUS | Status: AC
  Filled 2020-05-09: qty 750

## 2020-05-09 MED ORDER — ACETAMINOPHEN 500 MG PO TABS
ORAL_TABLET | ORAL | Status: AC
  Administered 2020-05-09: 1000 mg via ORAL
  Filled 2020-05-09: qty 2

## 2020-05-09 MED ORDER — BUPIVACAINE HCL (PF) 0.25 % IJ SOLN
INTRAMUSCULAR | Status: AC
  Filled 2020-05-09: qty 30

## 2020-05-09 MED ORDER — ONDANSETRON HCL 4 MG/2ML IJ SOLN
INTRAMUSCULAR | Status: DC | PRN
  Administered 2020-05-09: 4 mg via INTRAVENOUS

## 2020-05-09 MED ORDER — METOPROLOL SUCCINATE ER 25 MG PO TB24
37.5000 mg | ORAL_TABLET | Freq: Every day | ORAL | Status: DC
  Administered 2020-05-10: 37.5 mg via ORAL
  Filled 2020-05-09 (×2): qty 2

## 2020-05-09 MED ORDER — FENTANYL CITRATE (PF) 250 MCG/5ML IJ SOLN
INTRAMUSCULAR | Status: AC
  Filled 2020-05-09: qty 5

## 2020-05-09 MED ORDER — FENTANYL CITRATE (PF) 100 MCG/2ML IJ SOLN
25.0000 ug | INTRAMUSCULAR | Status: DC | PRN
  Administered 2020-05-09: 25 ug via INTRAVENOUS
  Administered 2020-05-09: 50 ug via INTRAVENOUS
  Administered 2020-05-09: 25 ug via INTRAVENOUS

## 2020-05-09 MED ORDER — FENTANYL CITRATE (PF) 100 MCG/2ML IJ SOLN
INTRAMUSCULAR | Status: AC
  Filled 2020-05-09: qty 2

## 2020-05-09 MED ORDER — PHENYLEPHRINE HCL-NACL 10-0.9 MG/250ML-% IV SOLN
INTRAVENOUS | Status: DC | PRN
  Administered 2020-05-09: 30 ug/min via INTRAVENOUS

## 2020-05-09 MED ORDER — ROSUVASTATIN CALCIUM 20 MG PO TABS
20.0000 mg | ORAL_TABLET | Freq: Every day | ORAL | Status: DC
  Administered 2020-05-09: 20 mg via ORAL
  Filled 2020-05-09: qty 1

## 2020-05-09 MED ORDER — VANCOMYCIN HCL 1000 MG IV SOLR
INTRAVENOUS | Status: AC
  Filled 2020-05-09: qty 1000

## 2020-05-09 MED ORDER — 0.9 % SODIUM CHLORIDE (POUR BTL) OPTIME
TOPICAL | Status: DC | PRN
  Administered 2020-05-09 (×2): 1000 mL

## 2020-05-09 MED ORDER — METAXALONE 800 MG PO TABS
800.0000 mg | ORAL_TABLET | Freq: Three times a day (TID) | ORAL | Status: DC | PRN
  Filled 2020-05-09: qty 1

## 2020-05-09 MED ORDER — PROMETHAZINE HCL 25 MG/ML IJ SOLN: 6.2500 mg | INTRAMUSCULAR | Status: DC | PRN

## 2020-05-09 MED ORDER — DULOXETINE HCL 20 MG PO CPEP
20.0000 mg | ORAL_CAPSULE | Freq: Two times a day (BID) | ORAL | Status: DC
  Administered 2020-05-09 – 2020-05-10 (×2): 20 mg via ORAL
  Filled 2020-05-09 (×3): qty 1

## 2020-05-09 MED ORDER — BUPIVACAINE HCL (PF) 0.5 % IJ SOLN
INTRAMUSCULAR | Status: DC | PRN
  Administered 2020-05-09: 20 mL

## 2020-05-09 MED ORDER — DIAZEPAM 5 MG PO TABS: 5.0000 mg | ORAL_TABLET | Freq: Four times a day (QID) | ORAL | Status: DC | PRN

## 2020-05-09 MED ORDER — NAPROXEN 250 MG PO TABS
250.0000 mg | ORAL_TABLET | Freq: Two times a day (BID) | ORAL | Status: DC
  Administered 2020-05-09 – 2020-05-10 (×2): 250 mg via ORAL
  Filled 2020-05-09 (×2): qty 1

## 2020-05-09 MED ORDER — PANTOPRAZOLE SODIUM 40 MG PO TBEC: 40.0000 mg | DELAYED_RELEASE_TABLET | Freq: Every day | ORAL | Status: DC

## 2020-05-09 MED ORDER — PANTOPRAZOLE SODIUM 40 MG PO TBEC
40.0000 mg | DELAYED_RELEASE_TABLET | Freq: Every day | ORAL | Status: DC
  Administered 2020-05-10: 40 mg via ORAL
  Filled 2020-05-09: qty 1

## 2020-05-09 MED ORDER — CEFAZOLIN SODIUM-DEXTROSE 2-4 GM/100ML-% IV SOLN
2.0000 g | INTRAVENOUS | Status: AC
  Administered 2020-05-09: 2 g via INTRAVENOUS
  Filled 2020-05-09: qty 100

## 2020-05-09 MED ORDER — METOCLOPRAMIDE HCL 5 MG PO TABS: 5.0000 mg | ORAL_TABLET | Freq: Three times a day (TID) | ORAL | Status: DC | PRN

## 2020-05-09 SURGICAL SUPPLY — 72 items
AID PSTN UNV HD RSTRNT DISP (MISCELLANEOUS) ×1
BASEPLATE AUG FULL 24X10X2 LAT (Plate) ×2 IMPLANT
BIT DRILL 5/64X5 DISP (BIT) ×3 IMPLANT
BLADE SAG 18X100X1.27 (BLADE) ×3 IMPLANT
CLOSURE WOUND 1/2 X4 (GAUZE/BANDAGES/DRESSINGS) ×1
COVER SURGICAL LIGHT HANDLE (MISCELLANEOUS) ×3 IMPLANT
COVER WAND RF STERILE (DRAPES) ×3 IMPLANT
CUP SUT UNIV REVERS 39 NEU (Shoulder) ×2 IMPLANT
DRAPE IMP U-DRAPE 54X76 (DRAPES) ×6 IMPLANT
DRAPE INCISE IOBAN 66X45 STRL (DRAPES) ×3 IMPLANT
DRAPE ORTHO SPLIT 77X108 STRL (DRAPES) ×6
DRAPE SURG 17X23 STRL (DRAPES) ×3 IMPLANT
DRAPE SURG ORHT 6 SPLT 77X108 (DRAPES) ×2 IMPLANT
DRAPE U-SHAPE 47X51 STRL (DRAPES) ×3 IMPLANT
DRSG AQUACEL AG ADV 3.5X10 (GAUZE/BANDAGES/DRESSINGS) ×3 IMPLANT
DRSG MEPILEX BORDER 4X8 (GAUZE/BANDAGES/DRESSINGS) ×2 IMPLANT
DURAPREP 26ML APPLICATOR (WOUND CARE) ×3 IMPLANT
ELECT BLADE 4.0 EZ CLEAN MEGAD (MISCELLANEOUS) ×3
ELECT REM PT RETURN 9FT ADLT (ELECTROSURGICAL) ×3
ELECTRODE BLDE 4.0 EZ CLN MEGD (MISCELLANEOUS) ×1 IMPLANT
ELECTRODE REM PT RTRN 9FT ADLT (ELECTROSURGICAL) ×1 IMPLANT
GLENOSPHERE 39+4 LAT/24 UNI RV (Joint) ×2 IMPLANT
GLOVE BIO SURGEON STRL SZ7.5 (GLOVE) ×6 IMPLANT
GLOVE BIOGEL PI IND STRL 8 (GLOVE) ×2 IMPLANT
GLOVE BIOGEL PI INDICATOR 8 (GLOVE) ×4
GOWN STRL REUS W/ TWL LRG LVL3 (GOWN DISPOSABLE) ×1 IMPLANT
GOWN STRL REUS W/ TWL XL LVL3 (GOWN DISPOSABLE) ×2 IMPLANT
GOWN STRL REUS W/TWL LRG LVL3 (GOWN DISPOSABLE) ×3
GOWN STRL REUS W/TWL XL LVL3 (GOWN DISPOSABLE) ×6
INSERT HUMERAL MED 39/ +3 (Shoulder) IMPLANT
INSERT MEDIUM HUMERAL 39/ +3 (Shoulder) ×3 IMPLANT
KIT BASIN OR (CUSTOM PROCEDURE TRAY) ×3 IMPLANT
KIT TURNOVER KIT B (KITS) ×3 IMPLANT
MANIFOLD NEPTUNE II (INSTRUMENTS) ×3 IMPLANT
MOD POST FOR MGS BSPLATE 30 (Miscellaneous) ×3 IMPLANT
NDL 1/2 CIR MAYO (NEEDLE) ×1 IMPLANT
NDL HYPO 25GX1X1/2 BEV (NEEDLE) ×1 IMPLANT
NEEDLE 1/2 CIR MAYO (NEEDLE) ×3 IMPLANT
NEEDLE HYPO 25GX1X1/2 BEV (NEEDLE) ×3 IMPLANT
NS IRRIG 1000ML POUR BTL (IV SOLUTION) ×3 IMPLANT
PACK SHOULDER (CUSTOM PROCEDURE TRAY) ×3 IMPLANT
PAD ARMBOARD 7.5X6 YLW CONV (MISCELLANEOUS) ×6 IMPLANT
PIN SET MODULAR GLENOID SYSTEM (PIN) ×2 IMPLANT
POST MODLR FOR MGS BSPLATE 30 (Miscellaneous) IMPLANT
REAMER ANGLED HEAD SMALL (DRILL) ×2 IMPLANT
RESTRAINT HEAD UNIVERSAL NS (MISCELLANEOUS) ×3 IMPLANT
SCREW PERI LOCK 5.5X16 (Screw) ×2 IMPLANT
SCREW PERI LOCK 5.5X24 (Screw) ×2 IMPLANT
SCREW PERIPHERAL 5.5X28 LOCK (Screw) ×2 IMPLANT
SCREW PERIPHERAL NL 4.5X36 (Screw) ×2 IMPLANT
SLING ARM IMMOBILIZER LRG (SOFTGOODS) IMPLANT
SLING ARM IMMOBILIZER MED (SOFTGOODS) IMPLANT
SPONGE LAP 18X18 RF (DISPOSABLE) IMPLANT
SPONGE LAP 4X18 RFD (DISPOSABLE) ×3 IMPLANT
STEM HUMERAL UNIVER REV SIZE 7 (Stem) ×2 IMPLANT
STRIP CLOSURE SKIN 1/2X4 (GAUZE/BANDAGES/DRESSINGS) ×2 IMPLANT
SUCTION FRAZIER HANDLE 10FR (MISCELLANEOUS) ×3
SUCTION TUBE FRAZIER 10FR DISP (MISCELLANEOUS) ×1 IMPLANT
SUT FIBERWIRE #2 38 T-5 BLUE (SUTURE) ×3
SUT MNCRL AB 3-0 PS2 27 (SUTURE) ×2 IMPLANT
SUT MNCRL AB 4-0 PS2 18 (SUTURE) ×3 IMPLANT
SUT VIC AB 1 CT1 27 (SUTURE)
SUT VIC AB 1 CT1 27XBRD ANBCTR (SUTURE) IMPLANT
SUT VIC AB 2-0 CT1 27 (SUTURE) ×3
SUT VIC AB 2-0 CT1 TAPERPNT 27 (SUTURE) ×1 IMPLANT
SUTURE FIBERWR #2 38 T-5 BLUE (SUTURE) ×1 IMPLANT
SYR CONTROL 10ML LL (SYRINGE) ×3 IMPLANT
TOWEL GREEN STERILE (TOWEL DISPOSABLE) ×3 IMPLANT
TOWEL GREEN STERILE FF (TOWEL DISPOSABLE) ×3 IMPLANT
TOWER CARTRIDGE SMART MIX (DISPOSABLE) IMPLANT
WATER STERILE IRR 1000ML POUR (IV SOLUTION) ×3 IMPLANT
YANKAUER SUCT BULB TIP NO VENT (SUCTIONS) ×3 IMPLANT

## 2020-05-09 NOTE — Anesthesia Procedure Notes (Signed)
Anesthesia Regional Block: Interscalene brachial plexus block   Pre-Anesthetic Checklist: ,, timeout performed, Correct Patient, Correct Site, Correct Laterality, Correct Procedure, Correct Position, site marked, Risks and benefits discussed,  Surgical consent,  Pre-op evaluation,  At surgeon's request and post-op pain management  Laterality: Left  Prep: chloraprep       Needles:  Injection technique: Single-shot  Needle Type: Echogenic Stimulator Needle     Needle Length: 10cm  Needle Gauge: 20     Additional Needles:   Procedures:,,,, ultrasound used (permanent image in chart),,,,  Narrative:  Start time: 05/09/2020 6:55 AM End time: 05/09/2020 7:00 AM Injection made incrementally with aspirations every 5 mL.  Performed by: Personally  Anesthesiologist: Lidia Collum, MD  Additional Notes: Standard monitors applied. Skin prepped. Good needle visualization with ultrasound. Injection made in 5cc increments with no resistance to injection. Patient tolerated the procedure well.

## 2020-05-09 NOTE — Plan of Care (Signed)
  Problem: Activity: Goal: Risk for activity intolerance will decrease Outcome: Progressing   Problem: Pain Managment: Goal: General experience of comfort will improve Outcome: Progressing   Problem: Safety: Goal: Ability to remain free from injury will improve Outcome: Progressing   

## 2020-05-09 NOTE — Transfer of Care (Signed)
Immediate Anesthesia Transfer of Care Note  Patient: Rasheen Bells  Procedure(s) Performed: LEFT SHOULDER CONVERSION OF TOTAL SHOULDER TO REVERSE ARTHROPLASTY (Left Shoulder)  Patient Location: PACU  Anesthesia Type:GA combined with regional for post-op pain  Level of Consciousness: awake, alert , oriented and patient cooperative  Airway & Oxygen Therapy: Patient Spontanous Breathing and Patient connected to face mask oxygen  Post-op Assessment: Report given to RN and Post -op Vital signs reviewed and stable  Post vital signs: Reviewed and stable  Last Vitals:  Vitals Value Taken Time  BP 138/66 05/09/20 1040  Temp    Pulse 69 05/09/20 1040  Resp 17 05/09/20 1040  SpO2 100 % 05/09/20 1040  Vitals shown include unvalidated device data.  Last Pain:  Vitals:   05/09/20 0627  TempSrc:   PainSc: 6       Patients Stated Pain Goal: 3 (03/40/35 2481)  Complications: No complications documented.

## 2020-05-09 NOTE — Op Note (Signed)
05/09/2020  10:08 AM  PATIENT:  William Mason    PRE-OPERATIVE DIAGNOSIS:  LEFT SHOULDER FAILED ARTHROPLASTY  POST-OPERATIVE DIAGNOSIS:  Same  PROCEDURE:  Revision of left total shoulder all components to a  Reverse arthroplasty  SURGEON:  Nicholes Stairs, MD  ASSISTANT: Jonelle Sidle, PA-C  Attestation: PA Mcclung was utilized throughout the procedure for positioning the patient, making the approach to the shoulder, explantation of failed device as well as implantation of new hardware.  He was also utilized for closure and transported to PACU.  ANESTHESIA:   General  ESTIMATED BLOOD LOSS: 200 cc  PREOPERATIVE INDICATIONS:  William Mason is a  59 y.o. male with a diagnosis of LEFT SHOULDER FAILED ARTHROPLASTY who failed conservative measures and elected for surgical management.    The risks benefits and alternatives were discussed with the patient preoperatively including but not limited to the risks of infection, bleeding, nerve injury, cardiopulmonary complications, the need for revision surgery, dislocation, brachial plexus palsy, incomplete relief of pain, among others, and the patient was willing to proceed.  OPERATIVE IMPLANTS:  Explanted Arthrex anatomic shoulder arthroplasty components x3 no breakage of components  Implantation reverse arthroplasty Arthrex Size 7 stem 10 degree superior augment baseplate 24+2 mm 43+1 mm glenosphere Standard humeral tray with a +3 polyethylene  OPERATIVE FINDINGS:  There was no gross contamination or concern for infection.  There was expected postoperative changes of the subscapularis but the lesser tuberosity osteotomy had healed nicely as had the subscapularis.  The supraspinatus was completely torn allowing for superior and anterior escape.  The humeral head was in a high riding position and wearing on the acromium.  Otherwise, the humeral stem was well fixed.  The glenoid polyethylene was also well-positioned and well fixed with no  loosening or eccentric wear.  Again no signs of infection were noted but we did send cultures x4, to deep into superficial.  OPERATIVE PROCEDURE: The patient was brought to the operating room and placed in the supine position. General anesthesia was administered. IV antibiotics were given. A Foley was placed. Time out was performed. The upper extremity was prepped and draped in usual sterile fashion. The patient was in a beachchair position. Deltopectoral approach was carried out. The subscapularis was released off of the bone.   Next we moved ahead with the explantation of previous total shoulder arthroplasty.  We began by removing the humeral head with a straight osteotome.  Again there were no signs of infection.  On the way in we obtain 2 superficial cultures.  Then we obtained a humeral bone culture as well as glenoid bone culture.  Once the humeral head was removed we then used a flexible osteotome to perform releases around the humeral stem 360 degrees.  Once we felt this was adequately released we then applied a back slap impactor on the proximal stem.  We were able to remove the stem with very minimal bone loss.  We then at this juncture made a revision cut of the humeral neck.  We felt this was necessary due to the high riding humeral head position as well as conversion to a reverse arthroplasty.  We resected an additional 3 mm.  Next we placed deep retractors and approach the glenoid.  The glenoid polyethylene was well-positioned with no signs of eccentric wear or loosening.  A curved osteotome was used to access the anterior lip of the glenoid polyethylene.  We were able to free this up.  We were unable to dislodge the pegs  en bloc.  We then used a 2 mm drill to resect the embedded polyethylene post.  At this time we were able to freshen up the glenoid reaming with a flat reamer.  Again there were no signs of deep space infection.  We then began implantation of the reverse  arthroplasty.  Due to the superior wear pattern of the glenoid on the coronal plane we elected to use a superiorly placed 10 degree full augment.  A central guidepin was placed into the glenoid vault.  This pin was placed bicortical and measured 30 mm.  We then reamed over this drill pin for the 10 degrees superior augment.  We then impacted the augment after preparing the glenoid face with the 30 mm post for the superior augment.  We then placed 1 inferior nonlocking screw that had excellent compression and bite.  Finally we placed 3 additional peripheral locking screws.  A peripheral reamer was used to remove any inferior debris.  We then impacted the 39+4 mm glenosphere.  This had excellent bite and the setscrew was applied as well.  Next we turned our attention to the humerus.  Deep retractors were removed and the humerus was positioned for reaming and broaching.  We reamed sequentially up to a size 7.  We then used the broaching system to a size 7 as well.  This had excellent fit and bite.  We then trialed and were satisfied with the size of the stem.  The final implant was placed with a size 7 stem.  We then used a +3 humeral tray liner with a standard humeral tray with +3 being on the polyethylene.  This provided excellent stability and range of motion with no impingement and no gapping.  I then impacted the real prosthesis into place, as well as the real humeral tray, and reduced the shoulder. The shoulder had excellent motion, and was stable, and I irrigated the wounds copiously.   We then repaired the subscapularis back to the stem utilizing the suture cup holes.  We did this with 2 separate suture tapes.  I then irrigated the shoulder copiously once more, repaired the deltopectoral interval with #2 FiberWire followed by subcutaneous Vicryl, then monocryl for the skin,  with Steri-Strips and sterile gauze for the skin. The patient was awakened and returned back in stable and satisfactory  condition. There no complications and they tolerated the procedure well.  All counts were correct x2.  Disposition: William Mason will be in his sling for comfort for the first 2 weeks.  You may remove the sling for active range of motion.  No lifting with the left arm.  He will be admitted for overnight observation pain control and routine a.m. labs.  He will be in his Aquacel dressing until follow-up.  He will see me in 2 weeks.

## 2020-05-09 NOTE — Brief Op Note (Signed)
05/09/2020  10:00 AM  PATIENT:  William Mason  59 y.o. male  PRE-OPERATIVE DIAGNOSIS:  LEFT SHOULDER FAILED ARTHROPLASTY  POST-OPERATIVE DIAGNOSIS:  LEFT SHOULDER FAILED ARTHROPLASTY  PROCEDURE:  Procedure(s): LEFT SHOULDER CONVERSION OF TOTAL SHOULDER TO REVERSE ARTHROPLASTY (Left) Revision of total shoulder all components  SURGEON:  Surgeon(s) and Role:    * Stann Mainland, Elly Modena, MD - Primary  PHYSICIAN ASSISTANT: Jonelle Sidle, PA-C.  ANESTHESIA:   regional and general  EBL:  200 mL   BLOOD ADMINISTERED:none  DRAINS: none   LOCAL MEDICATIONS USED:  NONE  SPECIMEN:  Source of Specimen:  2 deep cultures and 2 superficial cultures  DISPOSITION OF SPECIMEN:  microbiology  COUNTS:  YES  TOURNIQUET:  * No tourniquets in log *  DICTATION: .Note written in EPIC  PLAN OF CARE: Admit for overnight observation  PATIENT DISPOSITION:  PACU - hemodynamically stable.   Delay start of Pharmacological VTE agent (>24hrs) due to surgical blood loss or risk of bleeding: not applicable

## 2020-05-09 NOTE — Anesthesia Postprocedure Evaluation (Signed)
Anesthesia Post Note  Patient: William Mason  Procedure(s) Performed: LEFT SHOULDER CONVERSION OF TOTAL SHOULDER TO REVERSE ARTHROPLASTY (Left Shoulder)     Patient location during evaluation: PACU Anesthesia Type: Regional and General Level of consciousness: sedated Pain management: pain level controlled Vital Signs Assessment: post-procedure vital signs reviewed and stable Respiratory status: spontaneous breathing and respiratory function stable Cardiovascular status: stable Postop Assessment: no apparent nausea or vomiting Anesthetic complications: no   No complications documented.  Last Vitals:  Vitals:   05/09/20 1156 05/09/20 1218  BP: 128/81 (!) 145/83  Pulse: 66 64  Resp: 15 18  Temp: (!) 36.2 C 36.6 C  SpO2: 96% 99%    Last Pain:  Vitals:   05/09/20 1347  TempSrc:   PainSc: 0-No pain                 Merlinda Frederick

## 2020-05-09 NOTE — Anesthesia Procedure Notes (Signed)
Procedure Name: Intubation Date/Time: 05/09/2020 7:47 AM Performed by: Cleda Daub, CRNA Pre-anesthesia Checklist: Patient identified, Emergency Drugs available, Suction available and Patient being monitored Patient Re-evaluated:Patient Re-evaluated prior to induction Oxygen Delivery Method: Circle system utilized Preoxygenation: Pre-oxygenation with 100% oxygen Induction Type: IV induction Ventilation: Mask ventilation without difficulty Laryngoscope Size: Mac and 3 Grade View: Grade III Tube type: Oral Tube size: 7.5 mm Airway Equipment and Method: Stylet and Oral airway Placement Confirmation: ETT inserted through vocal cords under direct vision,  positive ETCO2 and breath sounds checked- equal and bilateral Secured at: 23 cm Tube secured with: Tape (intubated by Dr. Elgie Congo; deep and anterior airway per Dr. Elgie Congo.) Dental Injury: Teeth and Oropharynx as per pre-operative assessment

## 2020-05-09 NOTE — H&P (Signed)
ORTHOPAEDIC H and P  REQUESTING PHYSICIAN: Nicholes Stairs, MD  PCP:  Jani Gravel, MD  Chief Complaint: Left shoulder pain  HPI: William Mason is a 59 y.o. male who complains of left shoulder pain and dysfunction following a total shoulder replacement about a year or so ago by my partner.  He is here today for conversion of that to a reverse arthroplasty.  He is ready to proceed.  No new complaints since our last visit in the office.  Past Medical History:  Diagnosis Date  . Allergy   . Anxiety   . Arthritis    "knees, shoulders, back" (04/27/2017)  . Carotid artery stenosis   . Chronic back pain    "5 ruptured discs in the middle; 1 ruptured disc in the lower" (04/27/2017)  . Coronary artery disease    2 cardiac stents  oct 2018  . Depression   . Diverticulitis   . Exertional heat stroke ~ 01/2017  . Family history of adverse reaction to anesthesia    "mom had emergency appy at age 33; she had severe short term memory loss; never recovered"  . GERD (gastroesophageal reflux disease)   . Glaucoma    laser treated   narrow angle  . History of hiatal hernia   . HLD (hyperlipidemia) 02/01/2019  . Hyperlipidemia   . Hypertension   . OSA on CPAP   . PVC (premature ventricular contraction)   . Sleep apnea    Past Surgical History:  Procedure Laterality Date  . COLONOSCOPY     have had 5 or more  . CORONARY ANGIOPLASTY WITH STENT PLACEMENT  04/27/2017   x2   . CORONARY STENT INTERVENTION N/A 04/27/2017   Procedure: CORONARY STENT INTERVENTION;  Surgeon: Nigel Mormon, MD;  Location: [facility];  Service: Cardiovascular;  Laterality: N/A;  . LEFT HEART CATH AND CORONARY ANGIOGRAPHY N/A 04/27/2017   Procedure: LEFT HEART CATH AND CORONARY ANGIOGRAPHY;  Surgeon: Nigel Mormon, MD;  Location: [facility];  Service: Cardiovascular;  Laterality: N/A;  . LEFT HEART CATH AND CORONARY ANGIOGRAPHY N/A 08/29/2019   Procedure: LEFT HEART CATH AND CORONARY  ANGIOGRAPHY;  Surgeon: Nigel Mormon, MD;  Location: [facility];  Service: Cardiovascular;  Laterality: N/A;  . SHOULDER SURGERY Left ~ 1992   "tendon relocation; cut me open to do it"  . SHOULDER SURGERY Right 08/04/2018  . SURGERY SCROTAL / TESTICULAR Left ~ 1980   "cyst removed"  . TONSILLECTOMY    . TOTAL SHOULDER ARTHROPLASTY Left 05/18/2019   Procedure: Left Shoulder hardware removal with Anatomic shoulder arthroplasty;  Surgeon: Justice Britain, MD;  Location: [facility];  Service: Orthopedics;  Laterality: Left;  193min   Social History   Socioeconomic History  . Marital status: Married    Spouse name: Not on file  . Number of children: 0  . Years of education: Not on file  . Highest education level: Not on file  Occupational History  . Not on file  Tobacco Use  . Smoking status: Never Smoker  . Smokeless tobacco: Former Systems developer    Types: Secondary school teacher  . Vaping Use: Never used  Substance and Sexual Activity  . Alcohol use: Not Currently    Comment: 04/27/2017 "1-2 drinks/year; if that"  . Drug use: No  . Sexual activity: Not Currently  Other Topics Concern  . Not on file  Social History Narrative  . Not on file   Social Determinants  of Health   Financial Resource Strain:   . Difficulty of Paying Living Expenses: Not on file  Food Insecurity:   . Worried About Charity fundraiser in the Last Year: Not on file  . Ran Out of Food in the Last Year: Not on file  Transportation Needs:   . Lack of Transportation (Medical): Not on file  . Lack of Transportation (Non-Medical): Not on file  Physical Activity:   . Days of Exercise per Week: Not on file  . Minutes of Exercise per Session: Not on file  Stress:   . Feeling of Stress : Not on file  Social Connections:   . Frequency of Communication with Friends and Family: Not on file  . Frequency of Social Gatherings with Friends and Family: Not on file  . Attends Religious Services: Not on file  . Active  Member of Clubs or Organizations: Not on file  . Attends Archivist Meetings: Not on file  . Marital Status: Not on file   Family History  Problem Relation Age of Onset  . Breast cancer Maternal Grandmother   . Leukemia Paternal Grandfather   . COPD Father   . Heart disease Father   . Heart disease Mother   . Hypertension Mother   . Colon cancer Neg Hx   . Esophageal cancer Neg Hx   . Rectal cancer Neg Hx   . Stomach cancer Neg Hx    Allergies  Allergen Reactions  . Imdur [Isosorbide Nitrate] Other (See Comments)    Severe headaches (d/c by provider)  . Vicodin [Hydrocodone-Acetaminophen] Nausea Only   Prior to Admission medications   Medication Sig Start Date End Date Taking? Authorizing Provider  b complex vitamins capsule Take 1 capsule by mouth daily.   Yes [provider]  cetirizine (ZYRTEC) 10 MG tablet Take 10 mg by mouth daily.    Yes [provider]  [facility] ASPIRIN ADULT LOW DOSE 81 MG chewable tablet CHEW 1 TABLET (81 MG TOTAL) BY MOUTH DAILY. Patient taking differently: Chew 81 mg by mouth daily.  01/31/20  Yes Patwardhan, Manish J, MD  diazepam (VALIUM) 5 MG tablet Take 5 mg by mouth every 6 (six) hours as needed (lower back spasms).   Yes [provider]  DULoxetine (CYMBALTA) 20 MG capsule Take 20 mg by mouth 2 (two) times daily.    Yes [provider]  ezetimibe (ZETIA) 10 MG tablet Take 10 mg by mouth daily.   Yes [provider]  fluticasone (FLONASE) 50 MCG/ACT nasal spray Place 1 spray into both nostrils daily.    Yes [provider]  magnesium 30 MG tablet Take 30 mg by mouth daily.   Yes [provider]  metoprolol succinate (TOPROL-XL) 25 MG 24 hr tablet TAKE 1.5 TABLETS (37.5 MG TOTAL) BY MOUTH DAILY. 08/30/19  Yes Patwardhan, Manish J, MD  nitroGLYCERIN (NITROSTAT) 0.4 MG SL tablet Place 1 tablet (0.4 mg total) under the tongue every 5 (five) minutes as needed for chest pain. 02/21/19 08/24/20  Yes Adrian Prows, MD  oxyCODONE-acetaminophen (PERCOCET) 5-325 MG tablet Take 1 tablet by mouth every 4 (four) hours as needed (max 6 q). 05/19/19  Yes Shuford, Olivia Mackie, PA-C  pantoprazole (PROTONIX) 40 MG tablet Take 1 tablet (40 mg total) by mouth daily. 04/28/17  Yes Patwardhan, Reynold Bowen, MD  Polyethyl Glycol-Propyl Glycol (SYSTANE ULTRA) 0.4-0.3 % SOLN Place 1-2 drops into both eyes 3 (three) times daily as needed (eye irritation).    Yes  [provider]  RABEprazole (ACIPHEX) 20 MG tablet Take 1 tablet (20 mg total) by mouth daily. 08/29/19  Yes Patwardhan, Manish J, MD  rosuvastatin (CRESTOR) 20 MG tablet Take 1 tablet (20 mg total) by mouth at bedtime. 04/27/17 08/24/20 Yes Patwardhan, Manish J, MD  isosorbide mononitrate (IMDUR) 30 MG 24 hr tablet TAKE 1 TABLET BY MOUTH EVERY DAY Patient not taking: Reported on 05/06/2020 11/16/19   Patwardhan, Reynold Bowen, MD  metaxalone (SKELAXIN) 800 MG tablet Take 800 mg by mouth 3 (three) times daily as needed for muscle spasms.    [provider]   No results found.  Positive ROS: All other systems have been reviewed and were otherwise negative with the exception of those mentioned in the HPI and as above.  Physical Exam: General: Alert, no acute distress Cardiovascular: No pedal edema Respiratory: No cyanosis, no use of accessory musculature GI: No organomegaly, abdomen is soft and non-tender Skin: No lesions in the area of chief complaint Neurologic: Sensation intact distally Psychiatric: Patient is competent for consent with normal mood and affect Lymphatic: No axillary or cervical lymphadenopathy  MUSCULOSKELETAL:  Left shoulder:  Previously utilized deltopectoral incision is nicely healed.  No signs of dehiscence or surrounding inflammation there.  Otherwise distally neurovascularly intact.  Assessment: 1 failed left total shoulder arthroplasty  Plan: -Our plan is to proceed today with conversion of the primary anatomic  shoulder replacement to a revision reverse shoulder arthroplasty.  We have discussed this in the office.  I do believe that his deltoid is functioning but unable to maneuver the arm due to the superior escape of the humeral head.  - The risks, benefits, and alternatives were discussed with the patient. There are risks associated with the surgery including, but not limited to, problems with anesthesia (death), infection, differences in leg length/angulation/rotation, fracture of bones, loosening or failure of implants, malunion, nonunion, hematoma (blood accumulation) which may require surgical drainage, blood clots, pulmonary embolism, nerve injury (foot drop), and blood vessel injury. The patient understands these risks and elects to proceed.  -We will plan to keep him 1 night postoperatively for pain control and observation as well as postoperative labs.  Discharge home tomorrow.    Nicholes Stairs, MD Cell [phone]    05/09/2020 7:15 AM

## 2020-05-09 NOTE — Progress Notes (Signed)
Patient admitted to room 30. Alert and oriented x4. Complains pain, pain med given. Skin assessment done. No problem noted.

## 2020-05-10 ENCOUNTER — Encounter (HOSPITAL_COMMUNITY): Payer: Self-pay | Admitting: Orthopedic Surgery

## 2020-05-10 DIAGNOSIS — T84098A Other mechanical complication of other internal joint prosthesis, initial encounter: Secondary | ICD-10-CM | POA: Diagnosis not present

## 2020-05-10 LAB — BASIC METABOLIC PANEL
Anion gap: 8 (ref 5–15)
BUN: 17 mg/dL (ref 8–23)
CO2: 26 mmol/L (ref 22–32)
Calcium: 8.8 mg/dL — ABNORMAL LOW (ref 8.9–10.3)
Chloride: 103 mmol/L (ref 98–111)
Creatinine, Ser: 1.14 mg/dL (ref 0.61–1.24)
GFR, Estimated: >60 mL/min (ref >60)
Glucose, Bld: 175 mg/dL — ABNORMAL HIGH (ref 70–99)
Potassium: 4.4 mmol/L (ref 3.5–5.1)
Sodium: 137 mmol/L (ref 135–145)

## 2020-05-10 LAB — HEMOGLOBIN AND HEMATOCRIT, BLOOD
HCT: 40.9 % (ref 39.0–52.0)
Hemoglobin: 13.4 g/dL (ref 13.0–17.0)

## 2020-05-10 MED ORDER — OXYCODONE HCL 5 MG PO TABS
5.0000 mg | ORAL_TABLET | ORAL | 0 refills | Status: AC | PRN
Start: 2020-05-10 — End: 2020-05-17

## 2020-05-10 MED ORDER — ONDANSETRON 4 MG PO TBDP: 4.0000 mg | ORAL_TABLET | Freq: Three times a day (TID) | ORAL | 0 refills | Status: DC | PRN

## 2020-05-10 NOTE — Progress Notes (Signed)
Discharge packet provided.  Discharge instructions reviewed with the patient.  The patient will be discharged to the home with his wife.

## 2020-05-10 NOTE — Evaluation (Addendum)
Occupational Therapy Evaluation/Discharge Patient Details Name: William Mason MRN: [id number] DOB: February 27, 1961 Today's Date: 05/10/2020    History of Present Illness William Mason is a 59 y.o. male who complains of left shoulder pain and dysfunction following a total shoulder replacement about a year or so ago by my partner.  He is here today for conversion of that to a reverse arthroplasty.   Clinical Impression   PTA, pt lives with wife and reports Independence with all daily tasks and mobility without use of AD. Pt presents now s/p surgery, motivated to participate. Pt able to demonstrate Modified Independence in all ADLs, mobility assessed today after initial education on compensatory strategies for completion. Educated pt on ROM parameters and exercises to complete - able to demo self ROM due to continued numbness from nerve block. Educated on sling use, ice application, edema control, and bed mobility. Pt reports he has access to [facility] or shower chair as needed for showering tasks. Pt demonstrated understanding of all education with handouts provided. No further skilled OT services needed at acute level. Recommend following surgeon recommendation for future therapies as indicated post-discharge.     Follow Up Recommendations  Follow surgeon's recommendation for DC plan and follow-up therapies    Equipment Recommendations  None recommended by OT    Recommendations for Other Services       Precautions / Restrictions Precautions Precautions: Shoulder;Fall Shoulder Interventions: Shoulder sling/immobilizer Precaution Booklet Issued: Yes (comment) Precaution Comments: Sling for comfort only Required Braces or Orthoses: Sling Restrictions Weight Bearing Restrictions: Yes Other Position/Activity Restrictions: A/PROM shoulder flex to 90*, ext rotation to 30*, abduction to 60*.      Mobility Bed Mobility Overal bed mobility: Modified Independent             General bed mobility  comments: Cued to avoid WB through L UE when progressing to EOB, pt able to demo follow through well    Transfers Overall transfer level: Modified independent Equipment used: None                  Balance Overall balance assessment: Modified Independent                                         ADL either performed or assessed with clinical judgement   ADL Overall ADL's : Modified independent                                       General ADL Comments: After initial education and walk through of precautions, pt able to don underwear, pants, shirt and sling Modified Independent. Able to walk to sink and brush teeth with independent implementation of compensatory strategies     Vision Baseline Vision/History: Wears glasses Wears Glasses: At all times Patient Visual Report: No change from baseline Vision Assessment?: No apparent visual deficits     Perception     Praxis      Pertinent Vitals/Pain Pain Assessment: No/denies pain (numb)     Hand Dominance Right   Extremity/Trunk Assessment Upper Extremity Assessment Upper Extremity Assessment: LUE deficits/detail LUE Deficits / Details: Pt still numb from nerve block, difficulty with pincer grasp, elbow flexion/extension (PROM [facility]). Wrist and composite flexion WFL. Difficulty with active shoulder movement due to numbness, AAROM shoulder flexion, ext rotation and abduction WFL within given  parameters LUE Sensation:  (numbness from nerve block) LUE Coordination: decreased fine motor;decreased gross motor   Lower Extremity Assessment Lower Extremity Assessment: Defer to PT evaluation   Cervical / Trunk Assessment Cervical / Trunk Assessment: Normal   Communication Communication Communication: No difficulties   Cognition Arousal/Alertness: Awake/alert Behavior During Therapy: WFL for tasks assessed/performed Overall Cognitive Status: Within Functional Limits for tasks assessed                                      General Comments  Educated on shoulder precautions for ADLs, sleeping, sling use and exercise program with good carryover noted. Handouts provided    Exercises Exercises: Shoulder Shoulder Exercises Shoulder Flexion: AAROM;Left;10 reps;Seated (to 90*) Shoulder Extension: AAROM;Left;10 reps;Seated Shoulder ABduction: AAROM;Left;10 reps;Seated (to 60*) Shoulder External Rotation: AAROM;Left;10 reps;Seated (to 30*) Elbow Flexion: AAROM;Left;10 reps;Seated Elbow Extension: AAROM;Left;10 reps;Seated Wrist Flexion: AROM;Left;10 reps;Seated Wrist Extension: AROM;Left;10 reps;Seated Digit Composite Flexion: AROM;Left;10 reps;Seated Composite Extension: AROM;Left;10 reps;Seated   Shoulder Instructions Shoulder Instructions Donning/doffing shirt without moving shoulder: Independent (able to move shoulder per precautions, though numb) Method for sponge bathing under operated UE: Independent Donning/doffing sling/immobilizer: Independent Correct positioning of sling/immobilizer: Independent ROM for elbow, wrist and digits of operated UE: Independent Sling wearing schedule (on at all times/off for ADL's): Independent Proper positioning of operated UE when showering: Independent Positioning of UE while sleeping: [place] expects to be discharged to:: Private residence Living Arrangements: Spouse/significant other Available Help at Discharge: Family;Available 24 hours/day Type of Home: House Home Access: Stairs to enter [employer] of Steps: 3 Entrance Stairs-Rails: Right;Left Home Layout: One level     Bathroom Shower/Tub: Tub/shower unit;Walk-in shower         Home Equipment: Hand held shower head   Additional Comments: has access to [facility], maybe shower chair       Prior Functioning/Environment Level of Independence: Independent                 OT Problem List:        OT  Treatment/Interventions:      OT Goals(Current goals can be found in the care plan section) Acute Rehab OT Goals Patient Stated Goal: go home, maintain ROM OT Goal Formulation: With patient  OT Frequency:     Barriers to D/C:            Co-evaluation              AM-PAC OT "6 Clicks" Daily Activity     Outcome Measure Help from another person eating meals?: None Help from another person taking care of personal grooming?: None Help from another person toileting, which includes using toliet, bedpan, or urinal?: None Help from another person bathing (including washing, rinsing, drying)?: None Help from another person to put on and taking off regular upper body clothing?: None Help from another person to put on and taking off regular lower body clothing?: None 6 Click Score: 24   End of Session Equipment Utilized During Treatment: Other (comment) (sling) Nurse Communication: Mobility status;Weight bearing status  Activity Tolerance: Patient tolerated treatment well Patient left: in chair;with call bell/phone within reach                   Time: 2992-4268 OT Time Calculation (min): 55 min Charges:  OT General Charges $OT Visit: 1 Visit OT Evaluation $OT Eval Low Complexity: 1 Low OT  Treatments $Self Care/Home Management : 38-52 mins  Layla Maw, OTR/L  Layla Maw 05/10/2020, 9:08 AM

## 2020-05-10 NOTE — Discharge Instructions (Signed)
- -  Maintain postoperative bandage until your follow-up appointment.  You may shower with this bandage in place, but do not submerge underwater.  -You may remove your sling for activities of daily living and exercises throughout the day.  You should wear it for comfort and while sleeping.  -No lifting with the left arm.  -You should apply ice to the left shoulder for 20 to 30 minutes/h that you are awake.  She do this around-the-clock.  -For mild to moderate pain use Tylenol and Advil as necessary.  -For breakthrough pain use oxycodone as necessary.  -Return to see Dr. Stann Mainland in 2 weeks for routine postoperative care.

## 2020-05-10 NOTE — Progress Notes (Signed)
   Subjective:  Patient reports pain as mild.  Nerve block still active.  No overnight events.  Doing well.  Objective:   VITALS:   Vitals:   05/09/20 1218 05/09/20 1732 05/09/20 2134 05/10/20 0443  BP: (!) 145/83 116/64 112/60 111/64  Pulse: 64 82 88 81  Resp: 18 20 15 16   Temp: 97.8 F (36.6 C) 97.7 F (36.5 C) 99.2 F (37.3 C) 98 F (36.7 C)  TempSrc: Oral Oral Oral Oral  SpO2: 99% 95% 93% 96%  Weight:      Height:        Neurologically intact Neurovascular intact Sensation intact distally Intact pulses distally Incision: dressing C/D/I Compartment soft sling in place   Lab Results  Component Value Date   WBC 5.7 05/06/2020   HGB 13.4 05/10/2020   HCT 40.9 05/10/2020   MCV 92.7 05/06/2020   PLT 185 05/06/2020   BMET    Component Value Date/Time   NA 137 05/10/2020 0326   NA 141 08/25/2019 1034   K 4.4 05/10/2020 0326   CL 103 05/10/2020 0326   CO2 26 05/10/2020 0326   GLUCOSE 175 (H) 05/10/2020 0326   BUN 17 05/10/2020 0326   BUN 16 08/25/2019 1034   CREATININE 1.14 05/10/2020 0326   CALCIUM 8.8 (L) 05/10/2020 0326   GFRNONAA >60 05/10/2020 0326   GFRAA 76 08/25/2019 1034     Assessment/Plan: 1 Day Post-Op   Active Problems:   Shoulder pain   History of left shoulder replacement   Up with therapy - sling for comfort and while sleeping - NWB LUE - ok for AROM per protocol  - OT to see before dc  - dc home today   Nicholes Stairs 05/10/2020, 7:40 AM   Geralynn Rile, MD [phone]

## 2020-05-10 NOTE — Plan of Care (Signed)
  Problem: Activity: Goal: Risk for activity intolerance will decrease Outcome: Progressing   Problem: Pain Managment: Goal: General experience of comfort will improve Outcome: Progressing   Problem: Safety: Goal: Ability to remain free from injury will improve Outcome: Progressing   Problem: Education: Goal: Knowledge of General Education information will improve Description: Including pain rating scale, medication(s)/side effects and non-pharmacologic comfort measures Outcome: Progressing   Problem: Health Behavior/Discharge Planning: Goal: Ability to manage health-related needs will improve Outcome: Progressing   Problem: Clinical Measurements: Goal: Ability to maintain clinical measurements within normal limits will improve Outcome: Progressing Goal: Will remain free from infection Outcome: Progressing Goal: Diagnostic test results will improve Outcome: Progressing Goal: Respiratory complications will improve Outcome: Progressing Goal: Cardiovascular complication will be avoided Outcome: Progressing   Problem: Activity: Goal: Risk for activity intolerance will decrease Outcome: Progressing   Problem: Nutrition: Goal: Adequate nutrition will be maintained Outcome: Progressing   Problem: Coping: Goal: Level of anxiety will decrease Outcome: Progressing   Problem: Elimination: Goal: Will not experience complications related to bowel motility Outcome: Progressing Goal: Will not experience complications related to urinary retention Outcome: Progressing   Problem: Pain Managment: Goal: General experience of comfort will improve Outcome: Progressing   Problem: Safety: Goal: Ability to remain free from injury will improve Outcome: Progressing   Problem: Skin Integrity: Goal: Risk for impaired skin integrity will decrease Outcome: Progressing

## 2020-05-10 NOTE — Plan of Care (Signed)
  Problem: Activity: Goal: Risk for activity intolerance will decrease Outcome: Progressing   Problem: Pain Managment: Goal: General experience of comfort will improve Outcome: Progressing   Problem: Safety: Goal: Ability to remain free from injury will improve Outcome: Progressing   

## 2020-05-10 NOTE — Progress Notes (Signed)
RT set up patient CPAP at beside. Patient has home mask. 2L O2 bleed in needed. Patient able to place on when he is ready.

## 2020-05-13 NOTE — Discharge Summary (Signed)
Patient ID: William Mason MRN: [id number] DOB/AGE: [birth year] 59 y.o.  Admit date: 05/09/2020 Discharge date: 05/10/2020  Primary Diagnosis: failed left total shoulder arthroplasty  Admission Diagnoses:  Past Medical History:  Diagnosis Date  . Allergy   . Anxiety   . Arthritis    "knees, shoulders, back" (04/27/2017)  . Carotid artery stenosis   . Chronic back pain    "5 ruptured discs in the middle; 1 ruptured disc in the lower" (04/27/2017)  . Coronary artery disease    2 cardiac stents  oct 2018  . Depression   . Diverticulitis   . Exertional heat stroke ~ 01/2017  . Family history of adverse reaction to anesthesia    "mom had emergency appy at age 72; she had severe short term memory loss; never recovered"  . GERD (gastroesophageal reflux disease)   . Glaucoma    laser treated   narrow angle  . History of hiatal hernia   . HLD (hyperlipidemia) 02/01/2019  . Hyperlipidemia   . Hypertension   . OSA on CPAP   . PVC (premature ventricular contraction)   . Sleep apnea    Discharge Diagnoses:   Active Problems:   Shoulder pain   History of left shoulder replacement  Estimated body mass index is 30.5 kg/m as calculated from the following:   Height as of this encounter: 5\' 11"  (1.803 m).   Weight as of this encounter: 99.2 kg.  Procedure:  Procedure(s) (LRB): LEFT SHOULDER CONVERSION OF TOTAL SHOULDER TO REVERSE ARTHROPLASTY (Left)   Consults: None  HPI: admitted for converseion of left TSA to reverse shoulder arthroplasty. Laboratory Data: Admission on 05/09/2020, Discharged on 05/10/2020  Component Date Value Ref Range Status  . ABO/RH(D) 05/09/2020    Final                   Value:O POS Performed at [facility], [place] [address], [place], [place] [address]   . Specimen Description 05/09/2020 TISSUE   Final  . Special Requests 05/09/2020 LEFT SHOULDER SUPERFICIAL 1 SPEC A   Final  . Gram Stain 05/09/2020    Final                   Value:RARE WBC PRESENT,  PREDOMINANTLY MONONUCLEAR NO ORGANISMS SEEN   . Culture 05/09/2020    Final                   Value:NO GROWTH 4 DAYS Performed at [facility], [place] [address], [place], [place] [address]   . Report Status 05/09/2020 PENDING   Incomplete  . Specimen Description 05/09/2020 TISSUE   Final  . Special Requests 05/09/2020 LEFT SHOULDER SUPERFICIAL 2 SPEC B   Final  . Gram Stain 05/09/2020    Final                   Value:NO WBC SEEN NO ORGANISMS SEEN   . Culture 05/09/2020    Final                   Value:NO GROWTH 4 DAYS Performed at [facility], [place] [address] [place],  [address]   . Report Status 05/09/2020 PENDING   Incomplete  . Specimen Description 05/09/2020 TISSUE   Final  . Special Requests 05/09/2020 LEFT SHOULDER DEEP 1 SPEC C   Final  . Gram Stain 05/09/2020    Final  Value:FEW WBC PRESENT, PREDOMINANTLY MONONUCLEAR NO ORGANISMS SEEN   . Culture 05/09/2020    Final                   Value:NO GROWTH 4 DAYS Performed at [facility], [place] [address] [place], [place] [address]   . Report Status 05/09/2020 PENDING   Incomplete  . Specimen Description 05/09/2020 TISSUE   Final  . Special Requests 05/09/2020 LEFT SHOULDER DEEP 2 SPEC D   Final  . Gram Stain 05/09/2020    Final                   Value:RARE WBC PRESENT, PREDOMINANTLY MONONUCLEAR NO ORGANISMS SEEN   . Culture 05/09/2020    Final                   Value:NO GROWTH 4 DAYS Performed at [facility], [place] [address] [place], [place] [address]   . Report Status 05/09/2020 PENDING   Incomplete  . Hemoglobin 05/10/2020 13.4  13.0 - 17.0 g/dL Final  . HCT 05/10/2020 40.9  39 - 52 % Final   Performed at [facility], [place] [address], [place], [place] [address]  . Sodium 05/10/2020 137  135 - 145 mmol/L Final  . Potassium 05/10/2020 4.4  3.5 - 5.1 mmol/L Final  . Chloride 05/10/2020 103  98 - 111 mmol/L Final  . CO2 05/10/2020 26  22 - 32 mmol/L Final  .  Glucose, Bld 05/10/2020 175* 70 - 99 mg/dL Final   Glucose reference range applies only to samples taken after fasting for at least 8 hours.  . BUN 05/10/2020 17  8 - 23 mg/dL Final  . Creatinine, Ser 05/10/2020 1.14  0.61 - 1.24 mg/dL Final  . Calcium 05/10/2020 8.8* 8.9 - 10.3 mg/dL Final  . GFR, Estimated 05/10/2020 >60  >60 mL/min Final   Comment: (NOTE) Calculated using the CKD-EPI Creatinine Equation (2021)   . Anion gap 05/10/2020 8  5 - 15 Final   Performed at [facility], [place] [address], [place], [place] [address]  Hospital Outpatient Visit on 05/06/2020  Component Date Value Ref Range Status  . SARS Coronavirus 2 05/06/2020 NEGATIVE  NEGATIVE Final   Comment: (NOTE) SARS-CoV-2 target nucleic acids are NOT DETECTED.  The SARS-CoV-2 RNA is generally detectable in upper and lower respiratory specimens during the acute phase of infection. Negative results do not preclude SARS-CoV-2 infection, do not rule out co-infections with other pathogens, and should not be used as the sole basis for treatment or other patient management decisions. Negative results must be combined with clinical observations, patient history, and epidemiological information. The expected result is Negative.  Fact Sheet for Patients: SugarRoll.be  Fact Sheet for Healthcare Providers: https://www.woods-mathews.com/  This test is not yet approved or cleared by the [place] FDA and  has been authorized for detection and/or diagnosis of SARS-CoV-2 by FDA under an Emergency Use Authorization (EUA). This EUA will remain  in effect (meaning this test can be used) for the duration of the COVID-19 declaration under Se                          ction 564(b)(1) of the Act, 21 U.S.C. section 360bbb-3(b)(1), unless the authorization is terminated or revoked sooner.  Performed at [facility], [place] [address], [place], [place] [address]   Hospital  Outpatient Visit on 05/06/2020  Component Date Value Ref Range Status  . MRSA, PCR 05/06/2020 POSITIVE* NEGATIVE Final  . Staphylococcus aureus 05/06/2020 POSITIVE* NEGATIVE Final   Comment: (NOTE) The Xpert SA Assay (FDA approved for NASAL specimens in patients 82 years of age and older), is one component of a comprehensive surveillance program. It is not intended to diagnose infection nor to guide or monitor treatment. Performed at [facility], [place] [address], [place], [place] [address]   . Sodium 05/06/2020 141  135 - 145 mmol/L Final   LIPEMIC SPECIMEN  . Potassium 05/06/2020 3.8  3.5 - 5.1 mmol/L Final  . Chloride 05/06/2020 106  98 - 111 mmol/L Final  . CO2 05/06/2020 25  22 - 32 mmol/L Final  . Glucose, Bld 05/06/2020 131* 70 - 99 mg/dL Final   Glucose reference range applies only to samples taken after fasting for at least 8 hours.  . BUN 05/06/2020 12  8 - 23 mg/dL Final  . Creatinine, Ser 05/06/2020 1.02  0.61 - 1.24 mg/dL Final  . Calcium 05/06/2020 9.0  8.9 - 10.3 mg/dL Final  . GFR, Estimated 05/06/2020 >60  >60 mL/min Final  . Anion gap 05/06/2020 10  5 - 15 Final   Performed at [facility], [place] [address] [place], [place] [address]  . WBC 05/06/2020 5.7  4.0 - 10.5 K/uL Final  . RBC 05/06/2020 5.09  4.22 - 5.81 MIL/uL Final  . Hemoglobin 05/06/2020 15.2  13.0 - 17.0 g/dL Final  . HCT 05/06/2020 47.2  39 - 52 % Final  . MCV 05/06/2020 92.7  80.0 - 100.0 fL Final  . MCH 05/06/2020 29.9  26.0 - 34.0 pg Final  . MCHC 05/06/2020 32.2  30.0 - 36.0 g/dL Final  . RDW 05/06/2020 12.9  11.5 - 15.5 % Final  . Platelets 05/06/2020 185  150 - 400 K/uL Final  . nRBC 05/06/2020 0.0  0.0 - 0.2 % Final   Performed at [facility], [place] [address] [place], [place] [address]  . ABO/RH(D) 05/06/2020 O POS   Final  . Antibody Screen 05/06/2020 NEG   Final  . Sample Expiration 05/06/2020 05/20/2020,2359   Final  . Extend sample reason 05/06/2020    Final                    Value:NO TRANSFUSIONS OR PREGNANCY IN THE PAST 3 MONTHS Performed at [facility], [address], [place], [place] [address]      X-Rays:DG Shoulder Left Port  Result Date: 05/09/2020 CLINICAL DATA:  Left shoulder replacement EXAM: LEFT SHOULDER COMPARISON:  CT left shoulder 03/22/2020 FINDINGS: Interval revision of left shoulder prosthesis. Interval placement of prosthesis in the scapula. Revision and replacement of the left humeral prosthesis. Normal alignment. IMPRESSION: Revision left shoulder replacement.  No complication. Electronically Signed   By: Franchot Gallo M.D.   On: 05/09/2020 11:04    EKG: Orders placed or performed in visit on 08/25/19  . EKG 12-Lead     Hospital Course: William Mason is a 59 y.o. who was admitted to Hospital. They were brought to the operating room on 05/09/2020 and underwent Procedure(s): LEFT SHOULDER CONVERSION OF TOTAL SHOULDER TO REVERSE ARTHROPLASTY.  Patient tolerated the procedure well and was later transferred to the recovery room and then to the orthopaedic floor for postoperative care.  They were given PO and IV analgesics for pain control following their surgery.  They were given 24 hours of postoperative  antibiotics of  Anti-infectives (From admission, onward)   Start     Dose/Rate Route Frequency Ordered Stop   05/09/20 1400  ceFAZolin (ANCEF) IVPB 1 g/50 mL premix        1 g 100 mL/hr over 30 Minutes Intravenous Every 6 hours 05/09/20 1219 05/10/20 0222   05/09/20 0935  vancomycin (VANCOCIN) powder  Status:  Discontinued          As needed 05/09/20 0935 05/09/20 1034   05/09/20 0730  vancomycin (VANCOCIN) IVPB 1000 mg/200 mL premix        1,000 mg 200 mL/hr over 60 Minutes Intravenous  Once 05/09/20 0729 05/09/20 0808   05/09/20 0615  ceFAZolin (ANCEF) IVPB 2g/100 mL premix        2 g 200 mL/hr over 30 Minutes Intravenous On call to O.R. 05/09/20 0600 05/09/20 0753   05/09/20 0615  vancomycin (VANCOCIN) IVPB 1000 mg/200  mL premix  Status:  Discontinued        1,000 mg 200 mL/hr over 60 Minutes Intravenous  Once 05/09/20 0600 05/09/20 0613   05/09/20 0611  vancomycin (VANCOCIN) 1-5 GM/200ML-% IVPB       Note to Pharmacy: Tamsen Snider   : cabinet override      05/09/20 0611 05/09/20 0806     and started on DVT prophylaxis in the form of Aspirin.   OT were ordered for total joint protocol.  Discharge planning consulted to help with postop disposition and equipment needs.  Patient had a good night on the evening of surgery.  They started to get up OOB with therapy on day one. On POD 1 the patient had progressed with therapy and meeting their goals.  Incision was healing well.  Patient was seen in rounds and was ready to go home.   Diet: Regular diet Activity:NWB Follow-up:in 2 weeks Disposition - Home Discharged Condition: good   Discharge Instructions    Call MD / Call 911   Complete by: As directed    If you experience chest pain or shortness of breath, CALL 911 and be transported to the hospital emergency room.  If you develope a fever above 101 F, pus (white drainage) or increased drainage or redness at the wound, or calf pain, call your surgeon's office.   Constipation Prevention   Complete by: As directed    Drink plenty of fluids.  Prune juice may be helpful.  You may use a stool softener, such as Colace (over the counter) 100 mg twice a day.  Use MiraLax (over the counter) for constipation as needed.   Diet - low sodium heart healthy   Complete by: As directed    Increase activity slowly as tolerated   Complete by: As directed      Allergies as of 05/10/2020      Reactions   Imdur [isosorbide Nitrate] Other (See Comments)   Severe headaches (d/c by provider)   Vicodin [hydrocodone-acetaminophen] Nausea Only      Medication List    TAKE these medications   b complex vitamins capsule Take 1 capsule by mouth daily.   cetirizine 10 MG tablet Commonly known as: ZYRTEC Take 10 mg by  mouth daily.   [facility] Aspirin Adult Low Dose 81 MG chewable tablet Generic drug: aspirin CHEW 1 TABLET (81 MG TOTAL) BY MOUTH DAILY. What changed: See the new instructions.   diazepam 5 MG tablet Commonly known as: VALIUM Take 5 mg by mouth every 6 (six) hours as needed (lower back spasms).  DULoxetine 20 MG capsule Commonly known as: CYMBALTA Take 20 mg by mouth 2 (two) times daily.   ezetimibe 10 MG tablet Commonly known as: ZETIA Take 10 mg by mouth daily.   fluticasone 50 MCG/ACT nasal spray Commonly known as: FLONASE Place 1 spray into both nostrils daily.   isosorbide mononitrate 30 MG 24 hr tablet Commonly known as: IMDUR TAKE 1 TABLET BY MOUTH EVERY DAY   magnesium 30 MG tablet Take 30 mg by mouth daily.   metaxalone 800 MG tablet Commonly known as: SKELAXIN Take 800 mg by mouth 3 (three) times daily as needed for muscle spasms.   metoprolol succinate 25 MG 24 hr tablet Commonly known as: TOPROL-XL TAKE 1.5 TABLETS (37.5 MG TOTAL) BY MOUTH DAILY.   nitroGLYCERIN 0.4 MG SL tablet Commonly known as: NITROSTAT Place 1 tablet (0.4 mg total) under the tongue every 5 (five) minutes as needed for chest pain.   ondansetron 4 MG disintegrating tablet Commonly known as: Zofran ODT Take 1 tablet (4 mg total) by mouth every 8 (eight) hours as needed.   oxyCODONE 5 MG immediate release tablet Commonly known as: Oxy IR/ROXICODONE Take 1-2 tablets (5-10 mg total) by mouth every 4 (four) hours as needed for up to 7 days for moderate pain or severe pain (pain score 4-6).   oxyCODONE-acetaminophen 5-325 MG tablet Commonly known as: Percocet Take 1 tablet by mouth every 4 (four) hours as needed (max 6 q).   pantoprazole 40 MG tablet Commonly known as: PROTONIX Take 1 tablet (40 mg total) by mouth daily.   RABEprazole 20 MG tablet Commonly known as: ACIPHEX Take 1 tablet (20 mg total) by mouth daily.   rosuvastatin 20 MG tablet Commonly known as: Crestor Take 1  tablet (20 mg total) by mouth at bedtime.   Systane Ultra 0.4-0.3 % Soln Generic drug: Polyethyl Glycol-Propyl Glycol Place 1-2 drops into both eyes 3 (three) times daily as needed (eye irritation).       Follow-up Information    Nicholes Stairs, MD In 2 weeks.   Specialty: Orthopedic Surgery Contact information: [address] [place] [address] [phone]               Signed: Geralynn Rile, MD Orthopaedic Surgery 05/13/2020, 6:10 PM

## 2020-05-14 LAB — AEROBIC/ANAEROBIC CULTURE W GRAM STAIN (SURGICAL/DEEP WOUND)
Culture: NO GROWTH
Culture: NO GROWTH
Culture: NO GROWTH
Culture: NO GROWTH
Gram Stain: NONE SEEN

## 2020-06-10 ENCOUNTER — Ambulatory Visit: Payer: Managed Care, Other (non HMO) | Admitting: Adult Health

## 2020-06-25 NOTE — Progress Notes (Signed)
Subjective:   William Mason, male    DOB: 1961-06-03, 59 y.o.   MRN: [id number]    Chief complaint:  Coronary artery disease   HPI  59 y/o Caucasian male with CAD s/p LCx/OM PCI 04/2017, mod asympytomatic lt carotid stenosis, hypertension, hyperlipidemia.  Patient is doing well, denies chest pain, shortness of breath, palpitations, leg edema, orthopnea, PND, TIA/syncope.  BP slightly elevated today, completely normal at home.    Current Outpatient Medications on File Prior to Visit  Medication Sig Dispense Refill  . cetirizine (ZYRTEC) 10 MG tablet Take 10 mg by mouth daily.     . [facility]S ASPIRIN ADULT LOW DOSE 81 MG chewable tablet CHEW 1 TABLET (81 MG TOTAL) BY MOUTH DAILY. (Patient taking differently: Chew 81 mg by mouth daily. ) 108 tablet 6  . diazepam (VALIUM) 5 MG tablet Take 5 mg by mouth every 6 (six) hours as needed (lower back spasms).    . DULoxetine (CYMBALTA) 20 MG capsule Take 20 mg by mouth 2 (two) times daily.     Marland Kitchen ezetimibe (ZETIA) 10 MG tablet Take 10 mg by mouth daily.    . fluticasone (FLONASE) 50 MCG/ACT nasal spray Place 1 spray into both nostrils daily.     . magnesium 30 MG tablet Take 30 mg by mouth daily.    . metaxalone (SKELAXIN) 800 MG tablet Take 800 mg by mouth 3 (three) times daily as needed for muscle spasms.    . metoprolol succinate (TOPROL-XL) 25 MG 24 hr tablet TAKE 1.5 TABLETS (37.5 MG TOTAL) BY MOUTH DAILY. 150 tablet 3  . nitroGLYCERIN (NITROSTAT) 0.4 MG SL tablet Place 1 tablet (0.4 mg total) under the tongue every 5 (five) minutes as needed for chest pain. 90 tablet 3  . ondansetron (ZOFRAN ODT) 4 MG disintegrating tablet Take 1 tablet (4 mg total) by mouth every 8 (eight) hours as needed. 20 tablet 0  . oxyCODONE-acetaminophen (PERCOCET) 5-325 MG tablet Take 1 tablet by mouth every 4 (four) hours as needed (max 6 q). 30 tablet 0  . Polyethyl Glycol-Propyl Glycol (SYSTANE ULTRA) 0.4-0.3 % SOLN Place 1-2 drops into both eyes 3 (three) times  daily as needed (eye irritation).     . RABEprazole (ACIPHEX) 20 MG tablet Take 1 tablet (20 mg total) by mouth daily. 90 tablet 3  . rosuvastatin (CRESTOR) 20 MG tablet Take 1 tablet (20 mg total) by mouth at bedtime. 30 tablet 11  . b complex vitamins capsule Take 1 capsule by mouth daily. (Patient not taking: Reported on 06/26/2020)     No current facility-administered medications on file prior to visit.    Cardiovascular studies:  EKG 06/26/2020: Sinus rhythm 65 bpm Right bundle branch block Left atrial enlargement  Carotid artery duplex 01/31/2020:  Minimal stenosis in the right internal carotid artery (minimal).  Stenosis in the left internal carotid artery (16-49%). Stenosis in the  left external carotid artery (<50%).  Antegrade right vertebral artery flow. Antegrade left vertebral artery  flow.  Follow up in one year is appropriate if clinically indicated. No  significant change from 01/26/2019.  Coronary angiography 08/29/2019: LM: Normal LAD: Prox and mid 20% focal stenoses LCx: Patent large OM2 stents. OM1 Synergy DES 3.0 X 12 mm & 3.5 X 12 mm. Rest normal        Pinched ostium of a small lateral branch at the stent site, with TIMI III flow. Unchanged compared to post PCI        angiogram in  2018.  RCA: Focal eccentric 40% stenosis. Unchanged compared to previous angiogram in 2018.  Normal LVEF and LVEDP  EKG 08/25/2019: Sinus  Rhythm  Right bundle branch block.  Left atrial enlargement.   Echocardiogram 04/01/2017: Left ventricle cavity is normal in size. Normal global wall motion. Normal diastolic filling pattern. Calculated EF 67%. Left atrial cavity is normal in size. 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 No evidence of pulmonary hypertension.   Recent labs: 05/10/2020: Glucose 175, BUN/Cr 17/1.14. EGFR >60. Na/K 137/4.4.  H/H 15/47. MCV 92. Platelets 185  08/02/2019: Chol 148, TG 245, HDL 40, LDL  64.   01/30/2019: Glucose 107. BUN/Cr 13/1.03. eGFR 79. Na/K 143/4.5. Rest of the CMP normal.  Chol 141, TG 172, HDL 43, LDL 64.   Review of Systems  Cardiovascular: Negative for chest pain, dyspnea on exertion, leg swelling, palpitations and syncope.    Vitals:   06/26/20 1045  BP: (!) 146/78  Pulse: 68  SpO2: 98%     Objective:    Physical Exam Vitals and nursing note reviewed.  Constitutional:      General: He is not in acute distress. Neck:     Vascular: No JVD.  Cardiovascular:     Rate and Rhythm: Normal rate and regular rhythm.     Heart sounds: Normal heart sounds. No murmur heard.   Pulmonary:     Effort: Pulmonary effort is normal.     Breath sounds: Normal breath sounds. No wheezing or rales.        Assessment & Recommendations:   59 y/o Caucasian male with CAD s/p LCx/OM PCI 04/2017, mod asympytomatic lt carotid stenosis, hypertension, hyperlipidemia.  CAD: No angina symptoms Continue aspirin, crestor + zetia, metoprolol succinate.  Hyperlipidemia: Significant improvement on crestor + zetia. Will get recent lipid panel results from PCP  Mod lt ICA stenosis: Asymptomatic. Continue aggressive medical management.  F/u in 1 year  Nigel Mormon, MD [facility] Pager: [phone] Office: [phone] If no answer Cell [phone]

## 2020-06-26 ENCOUNTER — Other Ambulatory Visit: Payer: Self-pay

## 2020-06-26 ENCOUNTER — Encounter: Payer: Self-pay | Admitting: Cardiology

## 2020-06-26 ENCOUNTER — Ambulatory Visit: Payer: Managed Care, Other (non HMO) | Admitting: Cardiology

## 2020-06-26 VITALS — BP 146/78 | HR 68 | Ht 71.0 in | Wt 207.0 lb

## 2020-06-26 DIAGNOSIS — E782 Mixed hyperlipidemia: Secondary | ICD-10-CM

## 2020-06-26 DIAGNOSIS — I2510 Atherosclerotic heart disease of native coronary artery without angina pectoris: Secondary | ICD-10-CM

## 2020-06-26 DIAGNOSIS — I10 Essential (primary) hypertension: Secondary | ICD-10-CM

## 2020-07-01 ENCOUNTER — Other Ambulatory Visit: Payer: Self-pay | Admitting: Cardiology

## 2020-07-01 DIAGNOSIS — K219 Gastro-esophageal reflux disease without esophagitis: Secondary | ICD-10-CM

## 2020-09-01 ENCOUNTER — Other Ambulatory Visit: Payer: Self-pay | Admitting: Cardiology

## 2020-09-01 DIAGNOSIS — R9439 Abnormal result of other cardiovascular function study: Secondary | ICD-10-CM

## 2020-10-19 ENCOUNTER — Other Ambulatory Visit: Payer: Self-pay | Admitting: Cardiology

## 2020-10-19 DIAGNOSIS — K219 Gastro-esophageal reflux disease without esophagitis: Secondary | ICD-10-CM

## 2020-10-23 NOTE — Telephone Encounter (Signed)
That's fine for time being, but ideally need to evaluate for any GI causes. Has he seen gastroenterology? If not, may need to be evaluated.   Thanks MJP

## 2020-10-24 ENCOUNTER — Other Ambulatory Visit: Payer: Self-pay

## 2020-10-24 DIAGNOSIS — K219 Gastro-esophageal reflux disease without esophagitis: Secondary | ICD-10-CM

## 2020-10-24 MED ORDER — OMEPRAZOLE 20 MG PO CPDR: 20.0000 mg | DELAYED_RELEASE_CAPSULE | Freq: Every day | ORAL | 2 refills | Status: DC

## 2021-01-29 ENCOUNTER — Ambulatory Visit: Payer: Managed Care, Other (non HMO)

## 2021-01-29 ENCOUNTER — Other Ambulatory Visit: Payer: Self-pay

## 2021-01-29 DIAGNOSIS — I6522 Occlusion and stenosis of left carotid artery: Secondary | ICD-10-CM

## 2021-01-29 DIAGNOSIS — I6523 Occlusion and stenosis of bilateral carotid arteries: Secondary | ICD-10-CM

## 2021-02-18 ENCOUNTER — Other Ambulatory Visit: Payer: Self-pay | Admitting: Cardiology

## 2021-03-18 ENCOUNTER — Telehealth: Payer: Self-pay | Admitting: Cardiology

## 2021-03-18 NOTE — Telephone Encounter (Signed)
Pt requesting refill on rosuvastatin. Pcp been out of office & pharmacy did not re-up meds, which is why pt wondering if we could do it. Pt has been w/o meds for several days & pt has been trying to contact pcp and pharmacy for several days about it.

## 2021-03-19 ENCOUNTER — Other Ambulatory Visit: Payer: Self-pay

## 2021-03-19 MED ORDER — ROSUVASTATIN CALCIUM 20 MG PO TABS: 20.0000 mg | ORAL_TABLET | Freq: Every day | ORAL | 0 refills | Status: DC

## 2021-03-19 NOTE — Telephone Encounter (Signed)
sent 

## 2021-04-04 ENCOUNTER — Other Ambulatory Visit: Payer: Self-pay | Admitting: Cardiology

## 2021-04-04 DIAGNOSIS — K219 Gastro-esophageal reflux disease without esophagitis: Secondary | ICD-10-CM

## 2021-05-17 ENCOUNTER — Other Ambulatory Visit: Payer: Self-pay | Admitting: Cardiology

## 2021-05-23 ENCOUNTER — Ambulatory Visit: Payer: Self-pay | Admitting: Surgery

## 2021-05-23 NOTE — H&P (Signed)
Subjective    Chief Complaint: New Consultation (hernia)       History of Present Illness: William Mason is a 60 y.o. male who is seen today as an office consultation at the request of Dr. Ashby Dawes for evaluation of New Consultation (hernia) .     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  This has become slightly larger but it does remain reducible.  There is some discomfort that radiates down into his testicle on the left.  No right-sided symptoms.  No obstructive symptoms.     Review of Systems: A complete review of systems was obtained from the patient.  I have reviewed this information and discussed as appropriate with the patient.  See HPI as well for other ROS.   Review of Systems  Constitutional: Negative.   HENT: Negative.   Eyes: Negative.   Respiratory: Negative.   Cardiovascular: Negative.   Gastrointestinal: Negative.   Genitourinary: Negative.   Musculoskeletal: Positive for back pain and joint pain.  Skin: Negative.   Neurological: Negative.   Endo/Heme/Allergies: Negative.   Psychiatric/Behavioral: Negative.         Medical History: Past Medical History      Past Medical History:  Diagnosis Date   Arrhythmia     Arthritis     GERD (gastroesophageal reflux disease)     Glaucoma (increased eye pressure)     Hyperlipidemia     Sleep apnea             Patient Active Problem List  Diagnosis   Coronary atherosclerosis   Essential hypertension   OSA on CPAP      Past Surgical History       Past Surgical History:  Procedure Laterality Date   CORONARY ANGIOPLASTY WITH STENT PLACEMENT       JOINT REPLACEMENT            Allergies      Allergies  Allergen Reactions   Hydrocodone Nausea              Current Outpatient Medications on File Prior to Visit  Medication Sig Dispense Refill    aspirin 81 MG chewable tablet aspirin 81 mg chewable tablet  CHEW 1 TABLET (81 MG TOTAL) BY MOUTH DAILY.       metoprolol succinate (TOPROL-XL) 25 MG XL tablet metoprolol succinate ER 25 mg tablet,extended release 24 hr  TAKE 1.5 TABLETS BY MOUTH DAILY.       oxyCODONE (ROXICODONE) 5 MG immediate release tablet oxycodone 5 mg tablet       cetirizine (ZYRTEC) 10 MG tablet cetirizine 10 mg tablet   10 mg by oral route.       diazePAM (VALIUM) 5 MG tablet Take by mouth       DULoxetine (CYMBALTA) 30 MG DR capsule TAKE 1 CAPSULE BY MOUTH EVERY DAY FOR 90 DAYS       ezetimibe (ZETIA) 10 mg tablet ezetimibe 10 mg tablet       fluticasone propionate (FLONASE) 50 mcg/actuation nasal spray fluticasone propionate 50 mcg/actuation nasal spray,suspension  INSTILL 2 SPRAYS IN EACH NOSTRIL ONCE DAILY       nitroGLYcerin (NITROSTAT) 0.4 MG SL tablet nitroglycerin 0.4 mg sublingual tablet       omeprazole (PRILOSEC) 20 MG DR capsule Take 20 mg by mouth once daily  No current facility-administered medications on file prior to visit.      Family History       Family History  Problem Relation Age of Onset   High blood pressure (Hypertension) Mother     Hyperlipidemia (Elevated cholesterol) Mother     Coronary Artery Disease (Blocked arteries around heart) Mother     Stroke Father     High blood pressure (Hypertension) Father     Coronary Artery Disease (Blocked arteries around heart) Father          Social History       Tobacco Use  Smoking Status Never Smoker  Smokeless Tobacco Never Used      Social History  Social History        Socioeconomic History   Marital status: Married  Tobacco Use   Smoking status: Never Smoker   Smokeless tobacco: Never Used  Scientific laboratory technician Use: Never used  Substance and Sexual Activity   Alcohol use: Never   Drug use: Never        Objective:              BP: 130/80  Pulse: 76  Temp: 36.6 C (97.9 F)  Weight: 95.1 kg (209 lb 9.6  oz)  Height: 180.3 cm (5\' 11" )    Body mass index is 29.23 kg/m.   Physical Exam    Constitutional:  WDWN in NAD, conversant, no obvious deformities; lying in bed comfortably Eyes:  Pupils equal, round; sclera anicteric; moist conjunctiva; no lid lag HENT:  Oral mucosa moist; good dentition  Neck:  No masses palpated, trachea midline; no thyromegaly Lungs:  CTA bilaterally; normal respiratory effort Breasts:  symmetric, no nipple changes; no palpable masses or lymphadenopathy on either side CV:  Regular rate and rhythm; no murmurs; extremities well-perfused with no edema Abd:  +bowel sounds, soft, non-tender, no palpable organomegaly; no sign of right inguinal hernia, visible reducible left inguinal hernia GU: Bilateral descended testes, no testicular masses Musc: Normal gait; no apparent clubbing or cyanosis in extremities Lymphatic:  No palpable cervical or axillary lymphadenopathy Skin:  Warm, dry; no sign of jaundice Psychiatric - alert and oriented x 4; calm mood and affect       Assessment and Plan:  Diagnoses and all orders for this visit:   Left inguinal hernia       Cardiac clearance   Left inguinal hernia repair with mesh.The surgical procedure has been discussed with the patient.  Potential risks, benefits, alternative treatments, and expected outcomes have been explained.  All of the patient's questions at this time have been answered.  The likelihood of reaching the patient's treatment goal is good.  The patient understand the proposed surgical procedure and wishes to proceed.    Imogene Burn. Georgette Dover, MD, [facility]  General Surgery   05/23/2021 11:39 AM

## 2021-05-29 ENCOUNTER — Other Ambulatory Visit: Payer: Self-pay

## 2021-05-29 ENCOUNTER — Ambulatory Visit: Payer: Managed Care, Other (non HMO) | Admitting: Cardiology

## 2021-05-29 ENCOUNTER — Encounter: Payer: Self-pay | Admitting: Cardiology

## 2021-05-29 VITALS — BP 126/69 | HR 65 | Temp 98.0°F | Resp 16 | Ht 71.0 in | Wt 211.0 lb

## 2021-05-29 DIAGNOSIS — I2510 Atherosclerotic heart disease of native coronary artery without angina pectoris: Secondary | ICD-10-CM

## 2021-05-29 DIAGNOSIS — I6522 Occlusion and stenosis of left carotid artery: Secondary | ICD-10-CM

## 2021-05-29 NOTE — Progress Notes (Signed)
lipi   Subjective:   William Mason, male    DOB: 06/13/1961, 60 y.o.   MRN: [id number]    Chief complaint:  Coronary artery disease   HPI  60 y/o Caucasian male with CAD s/p LCx/OM PCI 04/2017, mod asympytomatic lt carotid stenosis, hypertension, hyperlipidemia.  Patient is doing well, is going to undergo hernia surgery in the near future. He has had occasional twinges in his chest, lasting only for a few seconds. He does not have any exertional chest pain or dyspnea.    Current Outpatient Medications on File Prior to Visit  Medication Sig Dispense Refill   b complex vitamins capsule Take 1 capsule by mouth daily. (Patient not taking: Reported on 06/26/2020)     cetirizine (ZYRTEC) 10 MG tablet Take 10 mg by mouth daily.      [facility] ASPIRIN ADULT LOW DOSE 81 MG chewable tablet CHEW 1 TABLET BY MOUTH DAILY. 108 tablet 1   diazepam (VALIUM) 5 MG tablet Take 5 mg by mouth every 6 (six) hours as needed (lower back spasms).     DULoxetine (CYMBALTA) 20 MG capsule Take 20 mg by mouth 2 (two) times daily.      ezetimibe (ZETIA) 10 MG tablet Take 10 mg by mouth daily.     fluticasone (FLONASE) 50 MCG/ACT nasal spray Place 1 spray into both nostrils daily.      magnesium 30 MG tablet Take 30 mg by mouth daily.     metaxalone (SKELAXIN) 800 MG tablet Take 800 mg by mouth 3 (three) times daily as needed for muscle spasms.     metoprolol succinate (TOPROL-XL) 25 MG 24 hr tablet TAKE 1.5 TABLETS (37.5 MG TOTAL) BY MOUTH DAILY. 135 tablet 4   nitroGLYCERIN (NITROSTAT) 0.4 MG SL tablet Place 1 tablet (0.4 mg total) under the tongue every 5 (five) minutes as needed for chest pain. 90 tablet 3   omeprazole (PRILOSEC) 20 MG capsule TAKE 1 CAPSULE BY MOUTH EVERY DAY 90 capsule 3   ondansetron (ZOFRAN ODT) 4 MG disintegrating tablet Take 1 tablet (4 mg total) by mouth every 8 (eight) hours as needed. 20 tablet 0   oxyCODONE-acetaminophen (PERCOCET) 5-325 MG tablet Take 1 tablet by mouth every 4 (four) hours  as needed (max 6 q). 30 tablet 0   Polyethyl Glycol-Propyl Glycol (SYSTANE ULTRA) 0.4-0.3 % SOLN Place 1-2 drops into both eyes 3 (three) times daily as needed (eye irritation).      rosuvastatin (CRESTOR) 20 MG tablet Take 1 tablet (20 mg total) by mouth at bedtime. 90 tablet 0   No current facility-administered medications on file prior to visit.    Cardiovascular studies:  EKG 05/29/2021: Sinus rhythm 65 bpm Incomplete right bundle branch block  Carotid artery duplex 01/29/2021:  No evidence of significant stenosis in the right carotid vessels.  Duplex suggests stenosis in the left internal carotid artery (50-69%).  Duplex suggests stenosis in the left external carotid artery (<50%).  Antegrade right vertebral artery flow. Antegrade left vertebral artery  flow.  No significant change from 01/31/2020. Follow up in six months is  appropriate if clinically indicated.  Coronary angiography 08/29/2019: LM: Normal LAD: Prox and mid 20% focal stenoses LCx: Patent large OM2 stents. OM1 Synergy DES 3.0 X 12 mm & 3.5 X 12 mm. Rest normal        Pinched ostium of a small lateral branch at the stent site, with TIMI III flow. Unchanged compared to post PCI          angiogram in 2018.  RCA: Focal eccentric 40% stenosis. Unchanged compared to previous angiogram in 2018.   Normal LVEF and LVEDP  Echocardiogram 04/01/2017: Left ventricle cavity is normal in size. Normal global wall motion. Normal diastolic filling pattern. Calculated EF 67%. Left atrial cavity is normal in size. 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 No evidence of pulmonary hypertension.   Recent labs: 05/10/2020: Glucose 175, BUN/Cr 17/1.14. EGFR >60. Na/K 137/4.4.  H/H 15/47. MCV 92. Platelets 185  08/02/2019: Chol 148, TG 245, HDL 40, LDL 64.   01/30/2019: Glucose 107. BUN/Cr 13/1.03. eGFR 79. Na/K 143/4.5. Rest of the CMP normal.  Chol 141, TG 172, HDL 43, LDL  64.   Review of Systems  Cardiovascular:  Negative for chest pain, dyspnea on exertion, leg swelling, palpitations and syncope.   Vitals:   05/29/21 1450  BP: 126/69  Pulse: 65  Resp: 16  Temp: 98 F (36.7 C)  SpO2: 97%     Objective:    Physical Exam Vitals and nursing note reviewed.  Constitutional:      General: He is not in acute distress. Neck:     Vascular: No JVD.  Cardiovascular:     Rate and Rhythm: Normal rate and regular rhythm.     Pulses:          Carotid pulses are  on the left side with bruit.    Heart sounds: Normal heart sounds. No murmur heard. Pulmonary:     Effort: Pulmonary effort is normal.     Breath sounds: Normal breath sounds. No wheezing or rales.  Musculoskeletal:     Right lower leg: No edema.     Left lower leg: No edema.       Assessment & Recommendations:   60 y/o Caucasian male with CAD s/p LCx/OM PCI 04/2017, mod asympytomatic lt carotid stenosis, hypertension, hyperlipidemia.  CAD: No angina symptoms Continue aspirin, crestor + zetia, metoprolol succinate. Low cardiac risk for hernia surgery.  Hyperlipidemia: Significant improvement on crestor + zetia. Check lipid panel  Mod lt ICA stenosis: Asymptomatic. Continue aggressive medical management.  F/u in 1 year  Manish J Patwardhan, MD [facility] Pager: [phone] Office: [phone] If no answer Cell [phone]     

## 2021-05-30 ENCOUNTER — Encounter: Payer: Self-pay | Admitting: Cardiology

## 2021-06-14 ENCOUNTER — Other Ambulatory Visit: Payer: Self-pay | Admitting: Cardiology

## 2021-06-26 ENCOUNTER — Ambulatory Visit: Payer: Managed Care, Other (non HMO) | Admitting: Cardiology

## 2021-06-30 ENCOUNTER — Encounter (HOSPITAL_BASED_OUTPATIENT_CLINIC_OR_DEPARTMENT_OTHER): Payer: Self-pay | Admitting: Surgery

## 2021-06-30 ENCOUNTER — Other Ambulatory Visit: Payer: Self-pay

## 2021-07-01 NOTE — Progress Notes (Signed)

## 2021-07-03 ENCOUNTER — Ambulatory Visit (HOSPITAL_BASED_OUTPATIENT_CLINIC_OR_DEPARTMENT_OTHER)
Admission: RE | Admit: 2021-07-03 | Discharge: 2021-07-03 | Disposition: A | Discharge status: Home / Self Care | Payer: Managed Care, Other (non HMO) | Attending: Surgery | Admitting: Surgery

## 2021-07-03 ENCOUNTER — Encounter (HOSPITAL_BASED_OUTPATIENT_CLINIC_OR_DEPARTMENT_OTHER)
Admission: RE | Disposition: A | Discharge status: Home / Self Care | Payer: Self-pay | Source: Home / Self Care | Attending: Surgery

## 2021-07-03 ENCOUNTER — Encounter (HOSPITAL_BASED_OUTPATIENT_CLINIC_OR_DEPARTMENT_OTHER): Payer: Self-pay | Admitting: Surgery

## 2021-07-03 ENCOUNTER — Ambulatory Visit (HOSPITAL_BASED_OUTPATIENT_CLINIC_OR_DEPARTMENT_OTHER): Payer: Managed Care, Other (non HMO) | Admitting: Anesthesiology

## 2021-07-03 ENCOUNTER — Other Ambulatory Visit: Payer: Self-pay

## 2021-07-03 DIAGNOSIS — K219 Gastro-esophageal reflux disease without esophagitis: Secondary | ICD-10-CM | POA: Diagnosis not present

## 2021-07-03 DIAGNOSIS — Z955 Presence of coronary angioplasty implant and graft: Secondary | ICD-10-CM | POA: Diagnosis not present

## 2021-07-03 DIAGNOSIS — Z7951 Long term (current) use of inhaled steroids: Secondary | ICD-10-CM | POA: Insufficient documentation

## 2021-07-03 DIAGNOSIS — I2510 Atherosclerotic heart disease of native coronary artery without angina pectoris: Secondary | ICD-10-CM | POA: Insufficient documentation

## 2021-07-03 DIAGNOSIS — G4733 Obstructive sleep apnea (adult) (pediatric): Secondary | ICD-10-CM | POA: Insufficient documentation

## 2021-07-03 DIAGNOSIS — Z7982 Long term (current) use of aspirin: Secondary | ICD-10-CM | POA: Diagnosis not present

## 2021-07-03 DIAGNOSIS — K4090 Unilateral inguinal hernia, without obstruction or gangrene, not specified as recurrent: Secondary | ICD-10-CM | POA: Insufficient documentation

## 2021-07-03 DIAGNOSIS — Z79899 Other long term (current) drug therapy: Secondary | ICD-10-CM | POA: Insufficient documentation

## 2021-07-03 DIAGNOSIS — I10 Essential (primary) hypertension: Secondary | ICD-10-CM | POA: Insufficient documentation

## 2021-07-03 HISTORY — PX: INGUINAL HERNIA REPAIR: SHX194

## 2021-07-03 HISTORY — DX: Personal history of other diseases of the musculoskeletal system and connective tissue: Z87.39

## 2021-07-03 SURGERY — REPAIR, HERNIA, INGUINAL, ADULT
Anesthesia: General | Site: Groin | Laterality: Left

## 2021-07-03 MED ORDER — FENTANYL CITRATE (PF) 100 MCG/2ML IJ SOLN
INTRAMUSCULAR | Status: AC
  Filled 2021-07-03: qty 2

## 2021-07-03 MED ORDER — ACETAMINOPHEN 500 MG PO TABS
1000.0000 mg | ORAL_TABLET | ORAL | Status: AC
  Administered 2021-07-03: 1000 mg via ORAL

## 2021-07-03 MED ORDER — PROPOFOL 10 MG/ML IV BOLUS
INTRAVENOUS | Status: DC | PRN
  Administered 2021-07-03: 150 mg via INTRAVENOUS

## 2021-07-03 MED ORDER — OXYCODONE HCL 5 MG PO TABS
5.0000 mg | ORAL_TABLET | Freq: Once | ORAL | Status: AC
  Administered 2021-07-03: 5 mg via ORAL

## 2021-07-03 MED ORDER — CHLORHEXIDINE GLUCONATE CLOTH 2 % EX PADS: 6.0000 | MEDICATED_PAD | Freq: Once | CUTANEOUS | Status: DC

## 2021-07-03 MED ORDER — MIDAZOLAM HCL 2 MG/2ML IJ SOLN
1.0000 mg | Freq: Once | INTRAMUSCULAR | Status: AC
  Administered 2021-07-03: 1 mg via INTRAVENOUS

## 2021-07-03 MED ORDER — OXYCODONE HCL 5 MG PO TABS
ORAL_TABLET | ORAL | Status: AC
  Filled 2021-07-03: qty 1

## 2021-07-03 MED ORDER — FENTANYL CITRATE (PF) 100 MCG/2ML IJ SOLN
INTRAMUSCULAR | Status: DC | PRN
  Administered 2021-07-03: 25 ug via INTRAVENOUS

## 2021-07-03 MED ORDER — DEXAMETHASONE SODIUM PHOSPHATE 10 MG/ML IJ SOLN
INTRAMUSCULAR | Status: DC | PRN
  Administered 2021-07-03: 5 mg via INTRAVENOUS

## 2021-07-03 MED ORDER — LIDOCAINE HCL (CARDIAC) PF 100 MG/5ML IV SOSY
PREFILLED_SYRINGE | INTRAVENOUS | Status: DC | PRN
  Administered 2021-07-03: 40 mg via INTRAVENOUS

## 2021-07-03 MED ORDER — OXYCODONE HCL 5 MG PO TABS: 5.0000 mg | ORAL_TABLET | Freq: Four times a day (QID) | ORAL | 0 refills | Status: DC | PRN

## 2021-07-03 MED ORDER — BUPIVACAINE-EPINEPHRINE 0.25% -1:200000 IJ SOLN
INTRAMUSCULAR | Status: DC | PRN
  Administered 2021-07-03: 10 mL

## 2021-07-03 MED ORDER — HYDROMORPHONE HCL 1 MG/ML IJ SOLN
0.2500 mg | INTRAMUSCULAR | Status: DC | PRN
  Administered 2021-07-03: 0.5 mg via INTRAVENOUS

## 2021-07-03 MED ORDER — LACTATED RINGERS IV SOLN: INTRAVENOUS | Status: DC

## 2021-07-03 MED ORDER — FENTANYL CITRATE (PF) 100 MCG/2ML IJ SOLN
100.0000 ug | Freq: Once | INTRAMUSCULAR | Status: AC
  Administered 2021-07-03: 100 ug via INTRAVENOUS

## 2021-07-03 MED ORDER — CEFAZOLIN SODIUM-DEXTROSE 2-3 GM-%(50ML) IV SOLR
INTRAVENOUS | Status: DC | PRN
  Administered 2021-07-03: 2 g via INTRAVENOUS

## 2021-07-03 MED ORDER — BUPIVACAINE LIPOSOME 1.3 % IJ SUSP
INTRAMUSCULAR | Status: DC | PRN
  Administered 2021-07-03: 10 mL

## 2021-07-03 MED ORDER — BUPIVACAINE-EPINEPHRINE (PF) 0.5% -1:200000 IJ SOLN
INTRAMUSCULAR | Status: DC | PRN
  Administered 2021-07-03: 20 mL

## 2021-07-03 MED ORDER — HYDROMORPHONE HCL 1 MG/ML IJ SOLN
INTRAMUSCULAR | Status: AC
  Filled 2021-07-03: qty 0.5

## 2021-07-03 MED ORDER — 0.9 % SODIUM CHLORIDE (POUR BTL) OPTIME
TOPICAL | Status: DC | PRN
  Administered 2021-07-03: 120 mL

## 2021-07-03 MED ORDER — ONDANSETRON HCL 4 MG/2ML IJ SOLN
INTRAMUSCULAR | Status: DC | PRN
  Administered 2021-07-03: 4 mg via INTRAVENOUS

## 2021-07-03 MED ORDER — MIDAZOLAM HCL 2 MG/2ML IJ SOLN
INTRAMUSCULAR | Status: AC
  Filled 2021-07-03: qty 2

## 2021-07-03 MED ORDER — KETOROLAC TROMETHAMINE 30 MG/ML IJ SOLN
INTRAMUSCULAR | Status: AC
  Filled 2021-07-03: qty 1

## 2021-07-03 MED ORDER — BUPIVACAINE-EPINEPHRINE (PF) 0.25% -1:200000 IJ SOLN
INTRAMUSCULAR | Status: AC
  Filled 2021-07-03: qty 30

## 2021-07-03 MED ORDER — KETOROLAC TROMETHAMINE 30 MG/ML IJ SOLN
30.0000 mg | Freq: Once | INTRAMUSCULAR | Status: AC
  Administered 2021-07-03: 30 mg via INTRAVENOUS

## 2021-07-03 MED ORDER — CEFAZOLIN SODIUM-DEXTROSE 2-4 GM/100ML-% IV SOLN
INTRAVENOUS | Status: AC
  Filled 2021-07-03: qty 100

## 2021-07-03 MED ORDER — CEFAZOLIN SODIUM-DEXTROSE 2-4 GM/100ML-% IV SOLN: 2.0000 g | INTRAVENOUS | Status: DC

## 2021-07-03 MED ORDER — LACTATED RINGERS IV SOLN: INTRAVENOUS | Status: DC | PRN

## 2021-07-03 MED ORDER — ACETAMINOPHEN 500 MG PO TABS
ORAL_TABLET | ORAL | Status: AC
  Filled 2021-07-03: qty 2

## 2021-07-03 SURGICAL SUPPLY — 50 items
APL PRP STRL LF DISP 70% ISPRP (MISCELLANEOUS) ×1
APL SKNCLS STERI-STRIP NONHPOA (GAUZE/BANDAGES/DRESSINGS) ×1
BENZOIN TINCTURE PRP APPL 2/3 (GAUZE/BANDAGES/DRESSINGS) ×4 IMPLANT
BLADE CLIPPER SURG (BLADE) ×2 IMPLANT
BLADE HEX COATED 2.75 (ELECTRODE) ×4 IMPLANT
BLADE SURG 15 STRL LF DISP TIS (BLADE) ×2 IMPLANT
BLADE SURG 15 STRL SS (BLADE) ×3
CHLORAPREP W/TINT 26 (MISCELLANEOUS) ×4 IMPLANT
CLOSURE WOUND 1/2 X4 (GAUZE/BANDAGES/DRESSINGS) ×1
COVER BACK TABLE 60X90IN (DRAPES) ×4 IMPLANT
COVER MAYO STAND STRL (DRAPES) ×4 IMPLANT
DECANTER SPIKE VIAL GLASS SM (MISCELLANEOUS) ×2 IMPLANT
DRAIN PENROSE 1/2X12 LTX STRL (WOUND CARE) ×4 IMPLANT
DRAPE LAPAROTOMY TRNSV 102X78 (DRAPES) ×4 IMPLANT
DRAPE UTILITY XL STRL (DRAPES) ×4 IMPLANT
DRSG TEGADERM 4X4.75 (GAUZE/BANDAGES/DRESSINGS) ×4 IMPLANT
ELECT REM PT RETURN 9FT ADLT (ELECTROSURGICAL) ×3
ELECTRODE REM PT RTRN 9FT ADLT (ELECTROSURGICAL) ×2 IMPLANT
GAUZE 4X4 16PLY ~~LOC~~+RFID DBL (SPONGE) ×4 IMPLANT
GAUZE SPONGE 4X4 12PLY STRL (GAUZE/BANDAGES/DRESSINGS) ×4 IMPLANT
GAUZE SPONGE 4X4 12PLY STRL LF (GAUZE/BANDAGES/DRESSINGS) ×4 IMPLANT
GLOVE SURG ENC MOIS LTX SZ7 (GLOVE) ×6 IMPLANT
GLOVE SURG UNDER POLY LF SZ7.5 (GLOVE) ×6 IMPLANT
GOWN STRL REUS W/ TWL LRG LVL3 (GOWN DISPOSABLE) ×4 IMPLANT
GOWN STRL REUS W/TWL LRG LVL3 (GOWN DISPOSABLE) ×6
MESH PARIETEX PROGRIP LEFT (Mesh General) ×2 IMPLANT
NDL HYPO 25X1 1.5 SAFETY (NEEDLE) ×2 IMPLANT
NEEDLE HYPO 25X1 1.5 SAFETY (NEEDLE) ×3 IMPLANT
NS IRRIG 1000ML POUR BTL (IV SOLUTION) IMPLANT
PACK BASIN DAY SURGERY FS (CUSTOM PROCEDURE TRAY) ×4 IMPLANT
PENCIL SMOKE EVACUATOR (MISCELLANEOUS) ×4 IMPLANT
SLEEVE SCD COMPRESS KNEE MED (STOCKING) ×4 IMPLANT
SPONGE INTESTINAL PEANUT (DISPOSABLE) ×4 IMPLANT
STRIP CLOSURE SKIN 1/2X4 (GAUZE/BANDAGES/DRESSINGS) ×3 IMPLANT
SUT MNCRL AB 4-0 PS2 18 (SUTURE) ×4 IMPLANT
SUT SILK 2 0 SH (SUTURE) IMPLANT
SUT SILK 3 0 TIES 17X18 (SUTURE)
SUT SILK 3-0 18XBRD TIE BLK (SUTURE) IMPLANT
SUT VIC AB 0 CT1 27 (SUTURE)
SUT VIC AB 0 CT1 27XBRD ANBCTR (SUTURE) IMPLANT
SUT VIC AB 2-0 SH 27 (SUTURE) ×3
SUT VIC AB 2-0 SH 27XBRD (SUTURE) ×2 IMPLANT
SUT VIC AB 3-0 SH 27 (SUTURE) ×3
SUT VIC AB 3-0 SH 27X BRD (SUTURE) ×2 IMPLANT
SUT VICRYL 0 CT-2 (SUTURE) ×4 IMPLANT
SYR CONTROL 10ML LL (SYRINGE) ×4 IMPLANT
TOWEL GREEN STERILE FF (TOWEL DISPOSABLE) ×4 IMPLANT
TUBE CONNECTING 20'X1/4 (TUBING)
TUBE CONNECTING 20X1/4 (TUBING) IMPLANT
YANKAUER SUCT BULB TIP NO VENT (SUCTIONS) IMPLANT

## 2021-07-03 NOTE — Anesthesia Procedure Notes (Signed)
Anesthesia Regional Block: TAP block   Pre-Anesthetic Checklist: , timeout performed,  Correct Patient, Correct Site, Correct Laterality,  Correct Procedure, Correct Position, site marked,  Risks and benefits discussed,  Pre-op evaluation,  At surgeon's request and post-op pain management  Laterality: Left  Prep: Maximum Sterile Barrier Precautions used, chloraprep       Needles:  Injection technique: Single-shot  Needle Type: Echogenic Stimulator Needle     Needle Length: 9cm  Needle Gauge: 21     Additional Needles:   Procedures:,,,, ultrasound used (permanent image in chart),,    Narrative:  Start time: 07/03/2021 1:26 PM End time: 07/03/2021 1:36 PM Injection made incrementally with aspirations every 5 mL.  Performed by: Personally  Anesthesiologist: Roderic Palau, MD

## 2021-07-03 NOTE — Progress Notes (Signed)
Assisted Dr. Oren Bracket with left, ultrasound guided, transabdominal plane block. Side rails up, monitors on throughout procedure. See vital signs in flow sheet. Tolerated Procedure well.

## 2021-07-03 NOTE — Anesthesia Procedure Notes (Signed)
Procedure Name: LMA Insertion Date/Time: 07/03/2021 3:13 PM Performed by: Verita Lamb, CRNA Pre-anesthesia Checklist: Patient identified, Emergency Drugs available, Suction available and Patient being monitored Patient Re-evaluated:Patient Re-evaluated prior to induction Oxygen Delivery Method: Circle system utilized Preoxygenation: Pre-oxygenation with 100% oxygen Induction Type: IV induction Ventilation: Mask ventilation without difficulty LMA: LMA inserted LMA Size: 4.0 Number of attempts: 1 Airway Equipment and Method: Bite block Placement Confirmation: positive ETCO2, CO2 detector and breath sounds checked- equal and bilateral Tube secured with: Tape Dental Injury: Teeth and Oropharynx as per pre-operative assessment

## 2021-07-03 NOTE — H&P (Signed)
Subjective    Chief Complaint: New Consultation (hernia)       History of Present Illness: William Mason is a 60 y.o. male who is seen today as an office consultation at the request of Dr. Ashby Dawes for evaluation of New Consultation (hernia) .     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  This has become slightly larger but it does remain reducible.  There is some discomfort that radiates down into his testicle on the left.  No right-sided symptoms.  No obstructive symptoms.     Review of Systems: A complete review of systems was obtained from the patient.  I have reviewed this information and discussed as appropriate with the patient.  See HPI as well for other ROS.   Review of Systems  Constitutional: Negative.   HENT: Negative.   Eyes: Negative.   Respiratory: Negative.   Cardiovascular: Negative.   Gastrointestinal: Negative.   Genitourinary: Negative.   Musculoskeletal: Positive for back pain and joint pain.  Skin: Negative.   Neurological: Negative.   Endo/Heme/Allergies: Negative.   Psychiatric/Behavioral: Negative.         Medical History: Past Medical History         Past Medical History:  Diagnosis Date   Arrhythmia     Arthritis     GERD (gastroesophageal reflux disease)     Glaucoma (increased eye pressure)     Hyperlipidemia     Sleep apnea               Patient Active Problem List  Diagnosis   Coronary atherosclerosis   Essential hypertension   OSA on CPAP      Past Surgical History           Past Surgical History:  Procedure Laterality Date   CORONARY ANGIOPLASTY WITH STENT PLACEMENT       JOINT REPLACEMENT            Allergies         Allergies  Allergen Reactions   Hydrocodone Nausea                   Current Outpatient Medications on File Prior to Visit  Medication Sig  Dispense Refill   aspirin 81 MG chewable tablet aspirin 81 mg chewable tablet  CHEW 1 TABLET (81 MG TOTAL) BY MOUTH DAILY.       metoprolol succinate (TOPROL-XL) 25 MG XL tablet metoprolol succinate ER 25 mg tablet,extended release 24 hr  TAKE 1.5 TABLETS BY MOUTH DAILY.       oxyCODONE (ROXICODONE) 5 MG immediate release tablet oxycodone 5 mg tablet       cetirizine (ZYRTEC) 10 MG tablet cetirizine 10 mg tablet   10 mg by oral route.       diazePAM (VALIUM) 5 MG tablet Take by mouth       DULoxetine (CYMBALTA) 30 MG DR capsule TAKE 1 CAPSULE BY MOUTH EVERY DAY FOR 90 DAYS       ezetimibe (ZETIA) 10 mg tablet ezetimibe 10 mg tablet       fluticasone propionate (FLONASE) 50 mcg/actuation nasal spray fluticasone propionate 50 mcg/actuation nasal spray,suspension  INSTILL 2 SPRAYS IN EACH NOSTRIL ONCE DAILY       nitroGLYcerin (NITROSTAT) 0.4 MG SL tablet nitroglycerin 0.4 mg sublingual tablet       omeprazole (PRILOSEC)  20 MG DR capsule Take 20 mg by mouth once daily        No current facility-administered medications on file prior to visit.      Family History           Family History  Problem Relation Age of Onset   High blood pressure (Hypertension) Mother     Hyperlipidemia (Elevated cholesterol) Mother     Coronary Artery Disease (Blocked arteries around heart) Mother     Stroke Father     High blood pressure (Hypertension) Father     Coronary Artery Disease (Blocked arteries around heart) Father          Social History         Tobacco Use  Smoking Status Never Smoker  Smokeless Tobacco Never Used      Social History  Social History           Socioeconomic History   Marital status: Married  Tobacco Use   Smoking status: Never Smoker   Smokeless tobacco: Never Used  Scientific laboratory technician Use: Never used  Substance and Sexual Activity   Alcohol use: Never   Drug use: Never        Objective:                   BP: 130/80  Pulse: 76  Temp: 36.6 C (97.9  F)  Weight: 95.1 kg (209 lb 9.6 oz)  Height: 180.3 cm (5\' 11" )    Body mass index is 29.23 kg/m.   Physical Exam    Constitutional:  WDWN in NAD, conversant, no obvious deformities; lying in bed comfortably Eyes:  Pupils equal, round; sclera anicteric; moist conjunctiva; no lid lag HENT:  Oral mucosa moist; good dentition  Neck:  No masses palpated, trachea midline; no thyromegaly Lungs:  CTA bilaterally; normal respiratory effort Breasts:  symmetric, no nipple changes; no palpable masses or lymphadenopathy on either side CV:  Regular rate and rhythm; no murmurs; extremities well-perfused with no edema Abd:  +bowel sounds, soft, non-tender, no palpable organomegaly; no sign of right inguinal hernia, visible reducible left inguinal hernia GU: Bilateral descended testes, no testicular masses Musc: Normal gait; no apparent clubbing or cyanosis in extremities Lymphatic:  No palpable cervical or axillary lymphadenopathy Skin:  Warm, dry; no sign of jaundice Psychiatric - alert and oriented x 4; calm mood and affect       Assessment and Plan:  Diagnoses and all orders for this visit:   Left inguinal hernia       Cardiac clearance   Left inguinal hernia repair with mesh.The surgical procedure has been discussed with the patient.  Potential risks, benefits, alternative treatments, and expected outcomes have been explained.  All of the patient's questions at this time have been answered.  The likelihood of reaching the patient's treatment goal is good.  The patient understand the proposed surgical procedure and wishes to proceed.      Imogene Burn. Georgette Dover, MD, [facility]  General Surgery   07/03/2021 1:30 PM

## 2021-07-03 NOTE — Anesthesia Preprocedure Evaluation (Addendum)
Anesthesia Evaluation  Patient identified by MRN, date of birth, ID band Patient awake    Reviewed: Allergy & Precautions, H&P , NPO status , Patient's Chart, lab work & pertinent test results, reviewed documented beta blocker date and time   Airway Mallampati: II  TM Distance: >3 FB Neck ROM: Full    Dental no notable dental hx. (+) Teeth Intact, Dental Advisory Given   Pulmonary sleep apnea and Continuous Positive Airway Pressure Ventilation ,    Pulmonary exam normal breath sounds clear to auscultation       Cardiovascular hypertension, Pt. on medications and Pt. on home beta blockers + CAD and + Cardiac Stents   Rhythm:Regular Rate:Normal     Neuro/Psych Anxiety Depression negative neurological ROS     GI/Hepatic Neg liver ROS, hiatal hernia, GERD  Medicated,  Endo/Other  negative endocrine ROS  Renal/GU negative Renal ROS  negative genitourinary   Musculoskeletal  (+) Arthritis , Osteoarthritis,    Abdominal   Peds  Hematology negative hematology ROS (+)   Anesthesia Other Findings   Reproductive/Obstetrics negative OB ROS                            Anesthesia Physical Anesthesia Plan  ASA: 3  Anesthesia Plan: General   Post-op Pain Management: Regional block and Tylenol PO (pre-op)   Induction: Intravenous  PONV Risk Score and Plan: 3 and Ondansetron, Dexamethasone and Midazolam  Airway Management Planned: LMA  Additional Equipment:   Intra-op Plan:   Post-operative Plan: Extubation in OR  Informed Consent: I have reviewed the patients History and Physical, chart, labs and discussed the procedure including the risks, benefits and alternatives for the proposed anesthesia with the patient or authorized representative who has indicated his/her understanding and acceptance.     Dental advisory given  Plan Discussed with: CRNA  Anesthesia Plan Comments:         Anesthesia Quick Evaluation

## 2021-07-03 NOTE — Op Note (Signed)
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  This has become slightly larger but it does remain reducible.  There is some discomfort that radiates down into his testicle on the left.  No right-sided symptoms.  No obstructive symptoms.  Pre-operative Diagnosis: left reducible inguinal hernia Post-operative Diagnosis: same  Surgeon: Maia Petties   Assistants: None  Anesthesia: General LMA anesthesia and TAP block  ASA Class: 2  Procedure Details  The patient was seen again in the Holding Room. The risks, benefits, complications, treatment options, and expected outcomes were discussed with the patient. The possibilities of reaction to medication, pulmonary aspiration, perforation of viscus, bleeding, recurrent infection, the need for additional procedures, and development of a complication requiring transfusion or further operation were discussed with the patient and/or family. The likelihood of success in repairing the hernia and returning the patient to their previous functional status is good.  There was concurrence with the proposed plan, and informed consent was obtained. The site of surgery was properly noted/marked. The patient was taken to the Operating Room, identified as William Mason, and the procedure verified as left inguinal hernia repair. A Time Out was held and the above information confirmed.  The patient was placed in the supine position and underwent induction of anesthesia. The lower abdomen and groin was prepped with Chloraprep and draped in the standard fashion, and 0.25% Marcaine with epinephrine was used to anesthetize the skin over the mid-portion of the inguinal canal. An oblique incision was made. Dissection was carried down through the subcutaneous tissue with cautery to the  external oblique fascia.  We opened the external oblique fascia along the direction of its fibers to the external ring.  The spermatic cord was circumferentially dissected bluntly and retracted with a Penrose drain.  The ilioinguinal nerve was identified and preserved.  The floor of the inguinal canal was inspected and was intact.  We skeletonized the spermatic cord and reduced a moderate-sized indirect hernia sac.  The internal ring was tightened with 0 Vicryl  We used a left Progrip mesh which was inserted and deployed across the floor of the inguinal canal. The mesh was tucked underneath the external oblique fascia laterally.  The flap of the mesh was closed around the spermatic cord to recreate the internal inguinal ring.  The mesh was secured to the pubic tubercle with 0 Vicryl.  Additional stay sutures were placed in the lower edge of the mesh to attach it to the shelving edge.  The external oblique fascia was reapproximated with 2-0 Vicryl.  3-0 Vicryl was used to close the subcutaneous tissues and 4-0 Monocryl was used to close the skin in subcuticular fashion.  Benzoin and steri-strips were used to seal the incision.  A clean dressing was applied.  The patient was then extubated and brought to the recovery room in stable condition.  All sponge, instrument, and needle counts were correct prior to closure and at the conclusion of the case.   Estimated Blood Loss: Minimal                 Complications: None; patient tolerated the procedure well.         Disposition: PACU - hemodynamically stable.         Condition: stable  William Mason. William Dover, MD, [facility]  General Surgery   07/03/2021 3:53 PM

## 2021-07-03 NOTE — Anesthesia Postprocedure Evaluation (Signed)
Anesthesia Post Note  Patient: Carrie Schoonmaker  Procedure(s) Performed: OPEN LEFT INGUINAL HERNIA REPAIR WITH MESH (Left: Groin)     Patient location during evaluation: PACU Anesthesia Type: General and Regional Level of consciousness: awake and alert Pain management: pain level controlled Vital Signs Assessment: post-procedure vital signs reviewed and stable Respiratory status: spontaneous breathing, nonlabored ventilation and respiratory function stable Cardiovascular status: blood pressure returned to baseline and stable Postop Assessment: no apparent nausea or vomiting Anesthetic complications: no   No notable events documented.  Last Vitals:  Vitals:   07/03/21 1615 07/03/21 1630  BP: 128/70 130/71  Pulse: 66 70  Resp: 18 15  Temp:    SpO2: 98% 97%    Last Pain:  Vitals:   07/03/21 1630  TempSrc:   PainSc: 4                  Anjannette Gauger,W. EDMOND

## 2021-07-03 NOTE — Discharge Instructions (Addendum)
[facility] _______[facility]   INGUINAL HERNIA REPAIR: POST OP INSTRUCTIONS  Always review your discharge instruction sheet given to you by the facility where your surgery was performed. IF YOU HAVE DISABILITY OR FAMILY LEAVE FORMS, YOU MUST BRING THEM TO THE OFFICE FOR PROCESSING.   DO NOT GIVE THEM TO YOUR DOCTOR.  1. A  prescription for pain medication may be given to you upon discharge.  Take your pain medication as prescribed, if needed.  If narcotic pain medicine is not needed, then you may take acetaminophen (Tylenol) or ibuprofen (Advil) as needed. 2. Take your usually prescribed medications unless otherwise directed. If you need a refill on your pain medication, please contact your pharmacy.  They will contact our office to request authorization. Prescriptions will not be filled after 5 pm or on week-ends. 3. You should follow a light diet the first 24 hours after arrival home, such as soup and crackers, etc.  Be sure to include lots of fluids daily.  Resume your normal diet the day after surgery. 4.Most patients will experience some swelling and bruising around the umbilicus or in the groin and scrotum.  Ice packs and reclining will help.  Swelling and bruising can take several days to resolve.  6. It is common to experience some constipation if taking pain medication after surgery.  Increasing fluid intake and taking a stool softener (such as Colace) will usually help or prevent this problem from occurring.  A mild laxative (Milk of Magnesia or Miralax) should be taken according to package directions if there are no bowel movements after 48 hours. 7. Unless discharge instructions indicate otherwise, you may remove your bandages 24-48 hours after surgery, and you may shower at that time.  You may have steri-strips (small skin tapes) in place directly over the incision.  These strips should be left on the skin for 7-10 days.  If your surgeon used skin glue on the incision, you may  shower in 24 hours.  The glue will flake off over the next 2-3 weeks.  Any sutures or staples will be removed at the office during your follow-up visit. 8. ACTIVITIES:  You may resume regular (light) daily activities beginning the next day--such as daily self-care, walking, climbing stairs--gradually increasing activities as tolerated.  You may have sexual intercourse when it is comfortable.  Refrain from any heavy lifting or straining until approved by your doctor.  a.You may drive when you are no longer taking prescription pain medication, you can comfortably wear a seatbelt, and you can safely maneuver your car and apply brakes. b.RETURN TO WORK:   _____________________________________________  9.You should see your doctor in the office for a follow-up appointment approximately 2-3 weeks after your surgery.  Make sure that you call for this appointment within a day or two after you arrive home to insure a convenient appointment time. 10.OTHER INSTRUCTIONS: _________________________    _____________________________________  WHEN TO CALL YOUR DOCTOR: Fever over 101.0 Inability to urinate Nausea and/or vomiting Extreme swelling or bruising Continued bleeding from incision. Increased pain, redness, or drainage from the incision  The clinic staff is available to answer your questions during regular business hours.  Please dont hesitate to call and ask to speak to one of the nurses for clinical concerns.  If you have a medical emergency, go to the nearest emergency room or call 911.  A surgeon from [facility] is always on call at the hospital   [address] [place],  [place]  [address] ?  [address], [place], [place]   [address] [phone] ? [phone] ? FAX [phone] Web site: [email or web address]    No Tylenol until 6:16 pm No Ibuprofen/Motrin until 9:08 pm   Post Anesthesia Home Care Instructions  Activity: Get plenty of rest  for the remainder of the day. A responsible individual must stay with you for 24 hours following the procedure.  For the next 24 hours, DO NOT: -Drive a car -Paediatric nurse -Drink alcoholic beverages -Take any medication unless instructed by your physician -Make any legal decisions or sign important papers.  Meals: Start with liquid foods such as gelatin or soup. Progress to regular foods as tolerated. Avoid greasy, spicy, heavy foods. If nausea and/or vomiting occur, drink only clear liquids until the nausea and/or vomiting subsides. Call your physician if vomiting continues.  Special Instructions/Symptoms: Your throat may feel dry or sore from the anesthesia or the breathing tube placed in your throat during surgery. If this causes discomfort, gargle with warm salt water. The discomfort should disappear within 24 hours.  If you had a scopolamine patch placed behind your ear for the management of post- operative nausea and/or vomiting:  1. The medication in the patch is effective for 72 hours, after which it should be removed.  Wrap patch in a tissue and discard in the trash. Wash hands thoroughly with soap and water. 2. You may remove the patch earlier than 72 hours if you experience unpleasant side effects which may include dry mouth, dizziness or visual disturbances. 3. Avoid touching the patch. Wash your hands with soap and water after contact with the patch.    Information for Discharge Teaching: EXPAREL (bupivacaine liposome injectable suspension)   Your surgeon or anesthesiologist gave you EXPAREL(bupivacaine) to help control your pain after surgery.  EXPAREL is a local anesthetic that provides pain relief by numbing the tissue around the surgical site. EXPAREL is designed to release pain medication over time and can control pain for up to 72 hours. Depending on how you respond to EXPAREL, you may require less pain medication during your recovery.  Possible side  effects: Temporary loss of sensation or ability to move in the area where bupivacaine was injected. Nausea, vomiting, constipation Rarely, numbness and tingling in your mouth or lips, lightheadedness, or anxiety may occur. Call your doctor right away if you think you may be experiencing any of these sensations, or if you have other questions regarding possible side effects.  Follow all other discharge instructions given to you by your surgeon or nurse. Eat a healthy diet and drink plenty of water or other fluids.  If you return to the hospital for any reason within 96 hours following the administration of EXPAREL, it is important for health care providers to know that you have received this anesthetic. A teal colored band has been placed on your arm with the date, time and amount of EXPAREL you have received in order to alert and inform your health care providers. Please leave this armband in place for the full 96 hours following administration, and then you may remove the band.   Oxycodone 5 mg given at 4:49 pm

## 2021-07-04 NOTE — Transfer of Care (Signed)
Immediate Anesthesia Transfer of Care Note  Patient: William Mason  Procedure(s) Performed: OPEN LEFT INGUINAL HERNIA REPAIR WITH MESH (Left: Groin)  Patient Location: PACU  Anesthesia Type:General  Level of Consciousness: awake, alert  and oriented  Airway & Oxygen Therapy: Patient Spontanous Breathing and Patient connected to face mask oxygen  Post-op Assessment: Report given to RN and Post -op Vital signs reviewed and stable  Post vital signs: Reviewed and stable  Last Vitals:  Vitals Value Taken Time  BP 126/74 07/03/21 1703  Temp 37 C 07/03/21 1703  Pulse 62 07/03/21 1703  Resp 18 07/03/21 1703  SpO2 98 % 07/03/21 1703    Last Pain:  Vitals:   07/03/21 1703  TempSrc:   PainSc: 4          Complications: No notable events documented.

## 2021-07-07 ENCOUNTER — Encounter (HOSPITAL_BASED_OUTPATIENT_CLINIC_OR_DEPARTMENT_OTHER): Payer: Self-pay | Admitting: Surgery

## 2021-09-13 ENCOUNTER — Other Ambulatory Visit: Payer: Self-pay | Admitting: Cardiology

## 2021-09-13 DIAGNOSIS — R9439 Abnormal result of other cardiovascular function study: Secondary | ICD-10-CM

## 2021-10-03 ENCOUNTER — Other Ambulatory Visit: Payer: Self-pay | Admitting: Cardiology

## 2021-12-22 ENCOUNTER — Other Ambulatory Visit: Payer: Self-pay | Admitting: Cardiology

## 2021-12-22 ENCOUNTER — Other Ambulatory Visit: Payer: Self-pay

## 2021-12-22 DIAGNOSIS — R9439 Abnormal result of other cardiovascular function study: Secondary | ICD-10-CM

## 2021-12-22 MED ORDER — METOPROLOL TARTRATE 25 MG PO TABS: 25.0000 mg | ORAL_TABLET | Freq: Two times a day (BID) | ORAL | 1 refills | Status: DC

## 2021-12-25 ENCOUNTER — Telehealth: Payer: Self-pay

## 2021-12-25 NOTE — Telephone Encounter (Signed)
Does patient need to be on Metoprolol Succinate '25mg'$  BID or Metoprolol '25mg'$   BID? We are trying to clear up some confusion patient is having after talking to his pharmacy. He has picked up both bottles and will go with whatever you tell him to take, but for now, he will continue to take the Metoprolol Succinate until further notice. Please advise.

## 2022-01-26 ENCOUNTER — Ambulatory Visit: Payer: Managed Care, Other (non HMO)

## 2022-01-26 DIAGNOSIS — I6522 Occlusion and stenosis of left carotid artery: Secondary | ICD-10-CM

## 2022-02-05 ENCOUNTER — Ambulatory Visit: Payer: Managed Care, Other (non HMO) | Admitting: Cardiology

## 2022-02-05 ENCOUNTER — Encounter: Payer: Self-pay | Admitting: Cardiology

## 2022-02-05 VITALS — BP 126/74 | HR 72 | Temp 97.3°F | Resp 16 | Ht 71.0 in | Wt 219.8 lb

## 2022-02-05 DIAGNOSIS — I25118 Atherosclerotic heart disease of native coronary artery with other forms of angina pectoris: Secondary | ICD-10-CM

## 2022-02-05 DIAGNOSIS — I10 Essential (primary) hypertension: Secondary | ICD-10-CM

## 2022-02-05 DIAGNOSIS — I6522 Occlusion and stenosis of left carotid artery: Secondary | ICD-10-CM

## 2022-02-05 DIAGNOSIS — E782 Mixed hyperlipidemia: Secondary | ICD-10-CM

## 2022-02-05 DIAGNOSIS — I2510 Atherosclerotic heart disease of native coronary artery without angina pectoris: Secondary | ICD-10-CM

## 2022-02-05 MED ORDER — OXAPROZIN 600 MG PO TABS: 600.0000 mg | ORAL_TABLET | Freq: Every day | ORAL | 1 refills | Status: AC

## 2022-02-05 MED ORDER — ROSUVASTATIN CALCIUM 20 MG PO TABS: 20.0000 mg | ORAL_TABLET | Freq: Every day | ORAL | 3 refills | Status: DC

## 2022-02-05 MED ORDER — RABEPRAZOLE SODIUM 20 MG PO TBEC: 20.0000 mg | DELAYED_RELEASE_TABLET | Freq: Every day | ORAL | 1 refills | Status: DC

## 2022-02-05 MED ORDER — ASPIRIN 81 MG PO TBEC: 81.0000 mg | DELAYED_RELEASE_TABLET | Freq: Every day | ORAL | 3 refills | Status: DC

## 2022-02-05 MED ORDER — DULOXETINE HCL 30 MG PO CPEP: 30.0000 mg | ORAL_CAPSULE | Freq: Two times a day (BID) | ORAL | 3 refills | Status: DC

## 2022-02-05 MED ORDER — NITROGLYCERIN 0.4 MG SL SUBL: 0.4000 mg | SUBLINGUAL_TABLET | SUBLINGUAL | 2 refills | Status: DC | PRN

## 2022-02-05 MED ORDER — METOPROLOL TARTRATE 25 MG PO TABS: 25.0000 mg | ORAL_TABLET | Freq: Two times a day (BID) | ORAL | 3 refills | Status: DC

## 2022-02-05 MED ORDER — FLUTICASONE PROPIONATE 50 MCG/ACT NA SUSP: 1.0000 | Freq: Every day | NASAL | 1 refills | Status: DC

## 2022-02-05 MED ORDER — EZETIMIBE 10 MG PO TABS: 10.0000 mg | ORAL_TABLET | Freq: Every day | ORAL | 3 refills | Status: DC

## 2022-02-05 NOTE — Progress Notes (Signed)
lipi   Subjective:   William Mason, male    DOB: [birth year], 61 y.o.   MRN: [id number]    Chief complaint:  Coronary artery disease   HPI  61 y/o Caucasian male with CAD s/p LCx/OM PCI 04/2017, mod asympytomatic lt carotid stenosis, hypertension, hyperlipidemia.  Patient does not have any cardiac complaints. He is currently looking for a new primary care practice and will need refills on some of his noncardiac meds in the meantime.     Current Outpatient Medications:    Ascorbic Acid (VITAMIN C) 1000 MG tablet, Take 1,000 mg by mouth daily., Disp: , Rfl:    cetirizine (ZYRTEC) 10 MG tablet, Take 10 mg by mouth daily., Disp: , Rfl:    Cholecalciferol (VITAMIN D3) 10 MCG (400 UNIT) CAPS, Take by mouth., Disp: , Rfl:    [employer] ASPIRIN ADULT LOW DOSE 81 MG chewable tablet, CHEW 1 TABLET BY MOUTH DAILY., Disp: 108 tablet, Rfl: 1   diazepam (VALIUM) 5 MG tablet, Take 5 mg by mouth every 6 (six) hours as needed (lower back spasms)., Disp: , Rfl:    DULoxetine (CYMBALTA) 30 MG capsule, Take 30 mg by mouth 2 (two) times daily., Disp: , Rfl:    ezetimibe (ZETIA) 10 MG tablet, Take 10 mg by mouth daily., Disp: , Rfl:    fluticasone (FLONASE) 50 MCG/ACT nasal spray, Place 1 spray into both nostrils daily., Disp: , Rfl:    metoprolol tartrate (LOPRESSOR) 25 MG tablet, Take 1 tablet (25 mg total) by mouth 2 (two) times daily., Disp: 180 tablet, Rfl: 1   nitroGLYCERIN (NITROSTAT) 0.4 MG SL tablet, Place 1 tablet (0.4 mg total) under the tongue every 5 (five) minutes as needed for chest pain., Disp: 90 tablet, Rfl: 3   oxaprozin (DAYPRO) 600 MG tablet, Take by mouth daily., Disp: , Rfl:    oxyCODONE (OXY IR/ROXICODONE) 5 MG immediate release tablet, Take 1 tablet (5 mg total) by mouth every 6 (six) hours as needed for severe pain., Disp: 15 tablet, Rfl: 0   oxyCODONE-acetaminophen (PERCOCET) 5-325 MG tablet, Take 1 tablet by mouth every 4 (four) hours as needed (max 6 q)., Disp: 30 tablet, Rfl: 0    Polyethyl Glycol-Propyl Glycol 0.4-0.3 % SOLN, Place 1-2 drops into both eyes 3 (three) times daily as needed (eye irritation). , Disp: , Rfl:    RABEprazole (ACIPHEX) 20 MG tablet, Take 1 tablet by mouth daily., Disp: , Rfl:    rosuvastatin (CRESTOR) 20 MG tablet, TAKE 1 TABLET BY MOUTH EVERYDAY AT BEDTIME, Disp: 90 tablet, Rfl: 0   Sulfamethoxazole-Trimethoprim (BACTRIM PO), Take by mouth., Disp: , Rfl:    Zinc Sulfate (ZINC 15 PO), Take by mouth., Disp: , Rfl:   Cardiovascular studies:  EKG 02/05/2022: Sinus rhythm 68 bpm Incomplete right bundle branch block Borderline left atrial enlargement Frequent PACs    Carotid artery duplex 01/26/2022:  Duplex suggests stenosis in the right internal carotid artery (minimal).  Duplex suggests stenosis in the left internal carotid artery (16-49%).  Homogeneous plaque bilateral carotid arteries.  Antegrade right vertebral artery flow. Antegrade left vertebral artery  flow.  Compared to the study done on 01/29/2021, left ICA stenosis has decreased  from 50 to 69%. Follow up in one year is appropriate if clinically  indicated.   Coronary angiography 08/29/2019: LM: Normal LAD: Prox and mid 20% focal stenoses LCx: Patent large OM2 stents. OM1 Synergy DES 3.0 X 12 mm & 3.5 X 12 mm. Rest normal  Pinched ostium of a small lateral branch at the stent site, with TIMI III flow. Unchanged compared to post PCI        angiogram in 2018.  RCA: Focal eccentric 40% stenosis. Unchanged compared to previous angiogram in 2018.   Normal LVEF and LVEDP  Echocardiogram 04/01/2017: Left ventricle cavity is normal in size. Normal global wall motion. Normal diastolic filling pattern. Calculated EF 67%. Left atrial cavity is normal in size. 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 No evidence of pulmonary hypertension.   Recent labs: Jan-April 2023: BUN/Cr 18/1.3. EGFR 68.  Chol 155, TG 218, HDL 43, LDL  ?? TSH 1.8 normal  05/10/2020: Glucose 175, BUN/Cr 17/1.14. EGFR >60. Na/K 137/4.4.  H/H 15/47. MCV 92. Platelets 185  Review of Systems  Cardiovascular:  Negative for chest pain, dyspnea on exertion, leg swelling, palpitations and syncope.    Vitals:   02/05/22 0932  BP: 126/74  Pulse: 72  Resp: 16  Temp: (!) 97.3 F (36.3 C)  SpO2: 98%     Objective:    Physical Exam Vitals and nursing note reviewed.  Constitutional:      General: He is not in acute distress. Neck:     Vascular: No JVD.  Cardiovascular:     Rate and Rhythm: Normal rate and regular rhythm.     Pulses:          Carotid pulses are  on the left side with bruit.    Heart sounds: Normal heart sounds. No murmur heard. Pulmonary:     Effort: Pulmonary effort is normal.     Breath sounds: Normal breath sounds. No wheezing or rales.  Musculoskeletal:     Right lower leg: No edema.     Left lower leg: No edema.       Assessment & Recommendations:   61 y/o Caucasian male with CAD s/p LCx/OM PCI 04/2017, mod asympytomatic lt carotid stenosis, hypertension, hyperlipidemia.  CAD: No angina symptoms Continue aspirin, crestor + zetia, metoprolol succinate. Check fasting lipid panel and direct LDL Given borderline left atrial enlargement on EKG, will check echocardiogram  I am refilling his NSAID Oxaprozin for now, but would benefit from considering alternate options with his new PCP in future, given ongoing use of Aspirin and bleeding risk with Aspirin and NSAID. He takes it for his back.   Mod lt ICA stenosis: Asymptomatic, improved. Continue aggressive medical management. Repeat carotid duplex in 1 year  F/u in 1 year  Nigel Mormon, MD [facility] Pager: [phone] Office: [phone] If no answer Cell [phone]

## 2022-02-12 ENCOUNTER — Other Ambulatory Visit: Payer: Self-pay | Admitting: Cardiology

## 2022-02-12 DIAGNOSIS — R9439 Abnormal result of other cardiovascular function study: Secondary | ICD-10-CM

## 2022-02-17 ENCOUNTER — Other Ambulatory Visit: Payer: Self-pay

## 2022-02-17 MED ORDER — METOPROLOL SUCCINATE ER 50 MG PO TB24: 50.0000 mg | ORAL_TABLET | Freq: Every day | ORAL | 0 refills | Status: DC

## 2022-03-13 ENCOUNTER — Ambulatory Visit: Payer: Managed Care, Other (non HMO)

## 2022-03-13 DIAGNOSIS — I2510 Atherosclerotic heart disease of native coronary artery without angina pectoris: Secondary | ICD-10-CM

## 2022-04-07 ENCOUNTER — Other Ambulatory Visit: Payer: Self-pay | Admitting: Cardiology

## 2022-04-07 DIAGNOSIS — K219 Gastro-esophageal reflux disease without esophagitis: Secondary | ICD-10-CM

## 2022-04-08 ENCOUNTER — Other Ambulatory Visit: Payer: Self-pay

## 2022-04-08 MED ORDER — RABEPRAZOLE SODIUM 20 MG PO TBEC: 20.0000 mg | DELAYED_RELEASE_TABLET | Freq: Every day | ORAL | 1 refills | Status: DC

## 2022-04-09 ENCOUNTER — Other Ambulatory Visit: Payer: Self-pay | Admitting: Cardiology

## 2022-04-09 DIAGNOSIS — K219 Gastro-esophageal reflux disease without esophagitis: Secondary | ICD-10-CM

## 2022-05-18 ENCOUNTER — Other Ambulatory Visit: Payer: Self-pay | Admitting: Cardiology

## 2022-05-18 DIAGNOSIS — R9439 Abnormal result of other cardiovascular function study: Secondary | ICD-10-CM

## 2022-06-30 ENCOUNTER — Other Ambulatory Visit: Payer: Self-pay | Admitting: Surgery

## 2022-06-30 DIAGNOSIS — R1032 Left lower quadrant pain: Secondary | ICD-10-CM

## 2022-07-06 ENCOUNTER — Ambulatory Visit
Admission: RE | Admit: 2022-07-06 | Discharge: 2022-07-06 | Disposition: A | Discharge status: Home / Self Care | Payer: Managed Care, Other (non HMO) | Source: Ambulatory Visit | Attending: Surgery | Admitting: Surgery

## 2022-07-06 DIAGNOSIS — R1032 Left lower quadrant pain: Secondary | ICD-10-CM

## 2022-07-06 MED ORDER — GADOPICLENOL 0.5 MMOL/ML IV SOLN
10.0000 mL | Freq: Once | INTRAVENOUS | Status: AC | PRN
  Administered 2022-07-06: 10 mL via INTRAVENOUS

## 2022-07-08 NOTE — Progress Notes (Signed)
Please call the patient and let them know that their MRI showed a recurrence of his left inguinal hernia.  I would like to refer him to one of our minimally invasive surgeons to consider a laparoscopic or robotic repair.    Please get him in with AR, PS, or SG  at the next available opening

## 2022-07-27 ENCOUNTER — Encounter: Payer: Self-pay | Admitting: Cardiology

## 2022-08-16 ENCOUNTER — Other Ambulatory Visit: Payer: Self-pay | Admitting: Cardiology

## 2022-10-19 ENCOUNTER — Other Ambulatory Visit: Payer: Self-pay

## 2022-10-19 MED ORDER — RABEPRAZOLE SODIUM 20 MG PO TBEC: 20.0000 mg | DELAYED_RELEASE_TABLET | Freq: Every day | ORAL | 1 refills | Status: DC

## 2022-10-30 ENCOUNTER — Other Ambulatory Visit: Payer: Self-pay | Admitting: Cardiology

## 2022-11-18 ENCOUNTER — Other Ambulatory Visit: Payer: Self-pay | Admitting: Cardiology

## 2022-11-19 ENCOUNTER — Other Ambulatory Visit: Payer: Self-pay | Admitting: Cardiology

## 2022-12-07 NOTE — Progress Notes (Signed)
Sent message, via epic in basket, requesting orders in epic from surgeon.  

## 2022-12-08 ENCOUNTER — Ambulatory Visit: Payer: Self-pay | Admitting: Surgery

## 2022-12-11 NOTE — Patient Instructions (Signed)
DUE TO COVID-19 ONLY TWO VISITORS  (aged 64 and older)  ARE ALLOWED TO COME WITH YOU AND STAY IN THE WAITING ROOM ONLY DURING PRE OP AND PROCEDURE.   **NO VISITORS ARE ALLOWED IN THE SHORT STAY AREA OR RECOVERY ROOM!!**  IF YOU WILL BE ADMITTED INTO THE HOSPITAL YOU ARE ALLOWED ONLY FOUR SUPPORT PEOPLE DURING VISITATION HOURS ONLY (7 AM -8PM)   The support person(s) must pass our screening, gel in and out, and wear a mask at all times, including in the patient's room. Patients must also wear a mask when staff or their support person are in the room. Visitors GUEST BADGE MUST BE WORN VISIBLY  One adult visitor may remain with you overnight and MUST be in the room by 8 P.M.     Your procedure is scheduled on: 12/30/22   Report to [facility] Main Entrance    Report to admitting at : 6:15 AM   Call this number if you have problems the morning of surgery [phone]   Do not eat food :After Midnight.   After Midnight you may have the following liquids until : 5:30 AM DAY OF SURGERY  Water Black Coffee (sugar ok, NO MILK/CREAM OR CREAMERS)  Tea (sugar ok, NO MILK/CREAM OR CREAMERS) regular and decaf                             Plain Jell-O (NO RED)                                           Fruit ices (not with fruit pulp, NO RED)                                     Popsicles (NO RED)                                                                  Juice: apple, WHITE grape, WHITE cranberry Sports drinks like Gatorade (NO RED)               Oral Hygiene is also important to reduce your risk of infection.                                    Remember - BRUSH YOUR TEETH THE MORNING OF SURGERY WITH YOUR REGULAR TOOTHPASTE  DENTURES WILL BE REMOVED PRIOR TO SURGERY PLEASE DO NOT APPLY "Poly grip" OR ADHESIVES!!!   Do NOT smoke after Midnight   Take these medicines the morning of surgery with A SIP OF WATER: oxaprozin,duloxetine,cetirizine,metoprolol,rabeprazole.Diazepam.  Bring  CPAP mask and tubing day of surgery.                              You may not have any metal on your body including hair pins, jewelry, and body piercing             Do not  wear lotions, powders, perfumes/cologne, or deodorant              Men may shave face and neck.   Do not bring valuables to the hospital. [facility] IS NOT             RESPONSIBLE   FOR VALUABLES.   Contacts, glasses, or bridgework may not be worn into surgery.   Bring small overnight bag day of surgery.   DO NOT BRING YOUR HOME MEDICATIONS TO THE HOSPITAL. PHARMACY WILL DISPENSE MEDICATIONS LISTED ON YOUR MEDICATION LIST TO YOU DURING YOUR ADMISSION IN THE HOSPITAL!    Patients discharged on the day of surgery will not be allowed to drive home.  Someone NEEDS to stay with you for the first 24 hours after anesthesia.   Special Instructions: Bring a copy of your healthcare power of attorney and living will documents         the day of surgery if you haven't scanned them before.              Please read over the following fact sheets you were given: IF YOU HAVE QUESTIONS ABOUT YOUR PRE-OP INSTRUCTIONS PLEASE CALL [phone]    [facility] - Preparing for Surgery Before surgery, you can play an important role.  Because skin is not sterile, your skin needs to be as free of germs as possible.  You can reduce the number of germs on your skin by washing with CHG (chlorahexidine gluconate) soap before surgery.  CHG is an antiseptic cleaner which kills germs and bonds with the skin to continue killing germs even after washing. Please DO NOT use if you have an allergy to CHG or antibacterial soaps.  If your skin becomes reddened/irritated stop using the CHG and inform your nurse when you arrive at Short Stay. Do not shave (including legs and underarms) for at least 48 hours prior to the first CHG shower.  You may shave your face/neck. Please follow these instructions carefully:  1.  Shower with CHG Soap the night before  surgery and the  morning of Surgery.  2.  If you choose to wash your hair, wash your hair first as usual with your  normal  shampoo.  3.  After you shampoo, rinse your hair and body thoroughly to remove the  shampoo.                           4.  Use CHG as you would any other liquid soap.  You can apply chg directly  to the skin and wash                       Gently with a scrungie or clean washcloth.  5.  Apply the CHG Soap to your body ONLY FROM THE NECK DOWN.   Do not use on face/ open                           Wound or open sores. Avoid contact with eyes, ears mouth and genitals (private parts).                       Wash face,  Genitals (private parts) with your normal soap.             6.  Wash thoroughly, paying special attention to the area where your surgery  will be  performed.  7.  Thoroughly rinse your body with warm water from the neck down.  8.  DO NOT shower/wash with your normal soap after using and rinsing off  the CHG Soap.                9.  Pat yourself dry with a clean towel.            10.  Wear clean pajamas.            11.  Place clean sheets on your bed the night of your first shower and do not  sleep with pets. Day of Surgery : Do not apply any lotions/deodorants the morning of surgery.  Please wear clean clothes to the hospital/surgery center.  FAILURE TO FOLLOW THESE INSTRUCTIONS MAY RESULT IN THE CANCELLATION OF YOUR SURGERY PATIENT SIGNATURE_________________________________  NURSE SIGNATURE__________________________________  ________________________________________________________________________

## 2022-12-15 ENCOUNTER — Other Ambulatory Visit: Payer: Self-pay

## 2022-12-15 ENCOUNTER — Encounter (HOSPITAL_COMMUNITY): Payer: Self-pay

## 2022-12-15 ENCOUNTER — Encounter (HOSPITAL_COMMUNITY)
Admission: RE | Admit: 2022-12-15 | Discharge: 2022-12-15 | Disposition: A | Discharge status: Home / Self Care | Payer: Managed Care, Other (non HMO) | Source: Ambulatory Visit | Attending: Surgery | Admitting: Surgery

## 2022-12-15 VITALS — BP 130/67 | HR 62 | Temp 98.4°F | Ht 71.0 in | Wt 220.0 lb

## 2022-12-15 DIAGNOSIS — I10 Essential (primary) hypertension: Secondary | ICD-10-CM | POA: Diagnosis not present

## 2022-12-15 DIAGNOSIS — Z01812 Encounter for preprocedural laboratory examination: Secondary | ICD-10-CM | POA: Diagnosis not present

## 2022-12-15 DIAGNOSIS — Z01818 Encounter for other preprocedural examination: Secondary | ICD-10-CM

## 2022-12-15 HISTORY — DX: Pneumonia, unspecified organism: J18.9

## 2022-12-15 HISTORY — DX: Fatty (change of) liver, not elsewhere classified: K76.0

## 2022-12-15 LAB — BASIC METABOLIC PANEL
Anion gap: 9 (ref 5–15)
BUN: 22 mg/dL (ref 8–23)
CO2: 26 mmol/L (ref 22–32)
Calcium: 9.1 mg/dL (ref 8.9–10.3)
Chloride: 102 mmol/L (ref 98–111)
Creatinine, Ser: 1.4 mg/dL — ABNORMAL HIGH (ref 0.61–1.24)
GFR, Estimated: 56 mL/min — ABNORMAL LOW (ref >60)
Glucose, Bld: 157 mg/dL — ABNORMAL HIGH (ref 70–99)
Potassium: 4.9 mmol/L (ref 3.5–5.1)
Sodium: 137 mmol/L (ref 135–145)

## 2022-12-15 LAB — CBC
HCT: 47.8 % (ref 39.0–52.0)
Hemoglobin: 15.8 g/dL (ref 13.0–17.0)
MCH: 30.7 pg (ref 26.0–34.0)
MCHC: 33.1 g/dL (ref 30.0–36.0)
MCV: 92.8 fL (ref 80.0–100.0)
Platelets: 202 10*3/uL (ref 150–400)
RBC: 5.15 MIL/uL (ref 4.22–5.81)
RDW: 12.9 % (ref 11.5–15.5)
WBC: 5.8 10*3/uL (ref 4.0–10.5)
nRBC: 0 % (ref 0.0–0.2)

## 2022-12-15 LAB — SURGICAL PCR SCREEN
MRSA, PCR: NEGATIVE
Staphylococcus aureus: NEGATIVE

## 2022-12-15 NOTE — Progress Notes (Addendum)
For Short Stay: COVID SWAB appointment date:  Bowel Prep reminder:   For Anesthesia: PCP - N/A Cardiologist - Dr. Truett Mainland. LOV: 02/05/22  Chest x-ray -  EKG - 02/05/22 Stress Test -  ECHO - 03/13/22 Cardiac Cath - 08/29/19 Pacemaker/ICD device last checked: Pacemaker orders received: Device Rep notified:  Spinal Cord Stimulator: N/A  Sleep Study - Yes CPAP - Yes  Fasting Blood Sugar - N/A Checks Blood Sugar _____ times a day Date and result of last Hgb A1c-  Last dose of GLP1 agonist- N/A GLP1 instructions:   Last dose of SGLT-2 inhibitors- N/A SGLT-2 instructions:   Blood Thinner Instructions: Aspirin Instructions: Pt. Will ask cardiologist about it. Last Dose:  Activity level: Can go up a flight of stairs and activities of daily living without stopping and without chest pain and/or shortness of breath   Able to exercise without chest pain and/or shortness of breath  Anesthesia review: Hx: CAD,HTN,OSA(CPAP),PVC.,MRSA  Patient denies shortness of breath, fever, cough and chest pain at PAT appointment   Patient verbalized understanding of instructions that were given to them at the PAT appointment. Patient was also instructed that they will need to review over the PAT instructions again at home before surgery.

## 2022-12-22 NOTE — Progress Notes (Signed)
Anesthesia Chart Review   Case: [id number] Date/Time: 12/30/22 0815   Procedure: LAPAROSCOPIC RECURRENT LEFT INGUINAL HERNIA REPAIR WITH MESH (Left)   Anesthesia type: General   Pre-op diagnosis: RECURRENT LEFT INGUINAL HERNIA   Location: [facility]OM 04 / [facility]   Surgeons: Quentin Ore, MD       DISCUSSION:62 y.o. never smoker with h/o OSA on CPAP, PVC, CAD s/p LCx/OM PCI 04/2017, carotid artery stenosis, recurrent left inguinal hernia scheduled for above procedure 12/30/2022 with Dr. Ivar Drape.   Pt last seen by cardiology 02/05/2022. Stable at this visit. Pt asymptomatic at this visit.  Echo ordered. Carotid stenosis followed with carotid duplex yearly.  Last study 01/26/2022 with left carotid stenosis 16-49%, per results this is a decrease from 50-69% on previous study.   Note received from cardiology 07/27/2022 which states, "William Mason is at low risk, from a cardiac standpoint, for his upcoming procedure: recurrent inguinal hernia repair.  It is ok to proceed without further cardiac testing.   If possible, continue Aspirin perioperatively. If it must be help, recommend minimizing duration to 5 days before surgery, resume 1 day after the surgery. "  VS: There were no vitals taken for this visit.  PROVIDERS: Patient, No Pcp Per  Cardiologist - Dr. Truett Mainland  LABS: Labs reviewed: Acceptable for surgery. (all labs ordered are listed, but only abnormal results are displayed)  Labs Reviewed - No data to display   IMAGES: Carotid artery duplex 01/26/2022: Duplex suggests stenosis in the right internal carotid artery (minimal). Duplex suggests stenosis in the left internal carotid artery (16-49%). Homogeneous plaque bilateral carotid arteries. Antegrade right vertebral artery flow. Antegrade left vertebral artery flow. Compared to the study done on 01/29/2021, left ICA stenosis has decreased from 50 to 69%. Follow up in one year is appropriate if  clinically indicated.    EKG:   CV: Echocardiogram 03/13/2022:  Normal LV systolic function with visual EF 60-65%. Left ventricle cavity  is normal in size. Normal left ventricular wall thickness. Normal global  wall motion. Normal diastolic filling pattern, normal LAP.  No significant valvular heart disease.  Compared to 04/01/2017 no significant change.   Cardiac Cath 08/29/2019 The left ventricular systolic function is normal. The left ventricular ejection fraction is greater than 65% by visual estimate. LV end diastolic pressure is normal.   LM: Normal LAD: Prox and mid 20% focal stenoses LCx: Patent large OM2 stents. Rest normal        Pinched ostium of a small lateral branch at the stent site, with TIMI III flow. Unchanged compared to post PCI        angiogram in 2018.  RCA: Focal eccentric 40% stenosis. Unchanged compared to previous angiogram in 2018.   Normal LVEF and LVEDP   Continue medical management for CAD. I do not think his episode of chest pain at night was related to an acute coronary event. Low cardiac risk for upcoming shoulder surgery.   Elder Negus, MD [facility] Pager: [phone] Office: [phone] Past Medical History:  Diagnosis Date   Allergy    Anxiety    Arthritis    "knees, shoulders, back" (04/27/2017)   Carotid artery stenosis    Chronic back pain    "5 ruptured discs in the middle; 1 ruptured disc in the lower" (04/27/2017)   Coronary artery disease    2 cardiac stents  oct 2018   Depression    Diverticulitis    Exertional heat stroke ~ 01/2017  Family history of adverse reaction to anesthesia    "mom had emergency appy at age 38; she had severe short term memory loss; never recovered"   Fatty liver disease, nonalcoholic    GERD (gastroesophageal reflux disease)    Glaucoma    laser treated   narrow angle   H/O degenerative disc disease    History of hiatal hernia    HLD (hyperlipidemia) 02/01/2019    Hyperlipidemia    Hypertension    OSA on CPAP    Pneumonia    PVC (premature ventricular contraction)    PVC (premature ventricular contraction)    Sleep apnea     Past Surgical History:  Procedure Laterality Date   COLONOSCOPY     have had 5 or more   CORONARY ANGIOPLASTY WITH STENT PLACEMENT  04/27/2017   x2    CORONARY STENT INTERVENTION N/A 04/27/2017   Procedure: CORONARY STENT INTERVENTION;  Surgeon: Elder Negus, MD;  [facility];  Service: Cardiovascular;  Laterality: N/A;   EYE SURGERY Left    laser   INGUINAL HERNIA REPAIR Left 07/03/2021   Procedure: OPEN LEFT INGUINAL HERNIA REPAIR WITH MESH;  Surgeon: Manus Rudd, MD;  Location: [facility];  Service: General;  Laterality: Left;   LEFT HEART CATH AND CORONARY ANGIOGRAPHY N/A 04/27/2017   Procedure: LEFT HEART CATH AND CORONARY ANGIOGRAPHY;  Surgeon: Elder Negus, MD;  [facility];  Service: Cardiovascular;  Laterality: N/A;   LEFT HEART CATH AND CORONARY ANGIOGRAPHY N/A 08/29/2019   Procedure: LEFT HEART CATH AND CORONARY ANGIOGRAPHY;  Surgeon: Elder Negus, MD;  [facility];  Service: Cardiovascular;  Laterality: N/A;   REVISION TOTAL SHOULDER TO REVERSE TOTAL SHOULDER Left 05/09/2020   Procedure: LEFT SHOULDER CONVERSION OF TOTAL SHOULDER TO REVERSE ARTHROPLASTY;  Surgeon: Yolonda Kida, MD;  [facility];  Service: Orthopedics;  Laterality: Left;   SHOULDER SURGERY Left ~ 1992   "tendon relocation; cut me open to do it"   SHOULDER SURGERY Right 08/04/2018   SHOULDER SURGERY Left 2021   SURGERY SCROTAL / TESTICULAR Left ~ 1980   "cyst removed"   TONSILLECTOMY     TOTAL SHOULDER ARTHROPLASTY Left 05/18/2019   Procedure: Left Shoulder hardware removal with Anatomic shoulder arthroplasty;  Surgeon: Francena Hanly, MD;  Location[facility];  Service: Orthopedics;  Laterality: Left;     MEDICATIONS: No current  facility-administered medications for this encounter.    aspirin EC 81 MG tablet   cetirizine (ZYRTEC) 10 MG tablet   diazepam (VALIUM) 5 MG tablet   DULoxetine (CYMBALTA) 30 MG capsule   ezetimibe (ZETIA) 10 MG tablet   fluticasone (FLONASE) 50 MCG/ACT nasal spray   metoprolol succinate (TOPROL-XL) 50 MG 24 hr tablet   oxaprozin (DAYPRO) 600 MG tablet   oxyCODONE-acetaminophen (PERCOCET) 5-325 MG tablet   Polyethyl Glycol-Propyl Glycol 0.4-0.3 % SOLN   RABEprazole (ACIPHEX) 20 MG tablet   rosuvastatin (CRESTOR) 20 MG tablet   nitroGLYCERIN (NITROSTAT) 0.4 MG SL tablet     [facility], PA-C [facility]Surgical Testing [phone]

## 2022-12-22 NOTE — Anesthesia Preprocedure Evaluation (Addendum)
Anesthesia Evaluation  Patient identified by MRN, date of birth, ID band Patient awake    Reviewed: Allergy & Precautions, H&P , NPO status , Patient's Chart, lab work & pertinent test results, reviewed documented beta blocker date and time   Airway Mallampati: I  TM Distance: >3 FB Neck ROM: Full    Dental no notable dental hx. (+) Teeth Intact, Dental Advisory Given   Pulmonary sleep apnea and Continuous Positive Airway Pressure Ventilation    Pulmonary exam normal breath sounds clear to auscultation       Cardiovascular hypertension, Pt. on home beta blockers + CAD and + Cardiac Stents  Normal cardiovascular exam Rhythm:Regular Rate:Normal     Neuro/Psych  PSYCHIATRIC DISORDERS Anxiety Depression    negative neurological ROS     GI/Hepatic Neg liver ROS, hiatal hernia,GERD  Medicated,,  Endo/Other  negative endocrine ROS    Renal/GU negative Renal ROS  negative genitourinary   Musculoskeletal  (+) Arthritis , Osteoarthritis,    Abdominal Normal abdominal exam  (+)   Peds  Hematology negative hematology ROS (+)   Anesthesia Other Findings   The left ventricular systolic function is normal.  The left ventricular ejection fraction is greater than 65% by visual estimate.  LV end diastolic pressure is normal.   LM: Normal LAD: Prox and mid 20% focal stenoses LCx: Patent large OM2 stents. Rest normal        Pinched ostium of a small lateral branch at the stent site, with TIMI III flow. Unchanged compared to post PCI        angiogram in 2018.  RCA: Focal eccentric 40% stenosis. Unchanged compared to previous angiogram in 2018.   Normal LVEF and LVEDP   Continue medical management for CAD. I do not think his episode of chest pain at night was related to an acute coronary event. Low cardiac risk for upcoming shoulder surgery.   Elder Negus, MD [facility] Pager: [phone] Office:  [phone]         Reproductive/Obstetrics negative OB ROS                             Anesthesia Physical Anesthesia Plan  ASA: 2  Anesthesia Plan: General   Post-op Pain Management: Regional block and Tylenol PO (pre-op)   Induction: Intravenous  PONV Risk Score and Plan: 3 and Ondansetron, Dexamethasone and Midazolam  Airway Management Planned: Oral ETT  Additional Equipment: None  Intra-op Plan:   Post-operative Plan: Extubation in OR  Informed Consent: I have reviewed the patients History and Physical, chart, labs and discussed the procedure including the risks, benefits and alternatives for the proposed anesthesia with the patient or authorized representative who has indicated his/her understanding and acceptance.     Dental advisory given  Plan Discussed with: CRNA  Anesthesia Plan Comments: (See PAT note 12/22/2022)        Anesthesia Quick Evaluation

## 2022-12-26 ENCOUNTER — Other Ambulatory Visit: Payer: Self-pay | Admitting: Cardiology

## 2022-12-30 ENCOUNTER — Ambulatory Visit (HOSPITAL_BASED_OUTPATIENT_CLINIC_OR_DEPARTMENT_OTHER): Payer: Managed Care, Other (non HMO) | Admitting: Physician Assistant

## 2022-12-30 ENCOUNTER — Encounter (HOSPITAL_COMMUNITY): Payer: Self-pay | Admitting: Surgery

## 2022-12-30 ENCOUNTER — Other Ambulatory Visit: Payer: Self-pay

## 2022-12-30 ENCOUNTER — Encounter (HOSPITAL_COMMUNITY)
Admission: RE | Disposition: A | Discharge status: Home / Self Care | Payer: Self-pay | Source: Ambulatory Visit | Attending: Surgery

## 2022-12-30 ENCOUNTER — Ambulatory Visit (HOSPITAL_COMMUNITY)
Admission: RE | Admit: 2022-12-30 | Discharge: 2022-12-30 | Disposition: A | Discharge status: Home / Self Care | Payer: Managed Care, Other (non HMO) | Source: Ambulatory Visit | Attending: Surgery | Admitting: Surgery

## 2022-12-30 ENCOUNTER — Ambulatory Visit (HOSPITAL_COMMUNITY): Payer: Managed Care, Other (non HMO) | Admitting: Physician Assistant

## 2022-12-30 DIAGNOSIS — K4091 Unilateral inguinal hernia, without obstruction or gangrene, recurrent: Secondary | ICD-10-CM | POA: Diagnosis present

## 2022-12-30 DIAGNOSIS — I2510 Atherosclerotic heart disease of native coronary artery without angina pectoris: Secondary | ICD-10-CM

## 2022-12-30 DIAGNOSIS — K449 Diaphragmatic hernia without obstruction or gangrene: Secondary | ICD-10-CM | POA: Diagnosis not present

## 2022-12-30 DIAGNOSIS — I10 Essential (primary) hypertension: Secondary | ICD-10-CM | POA: Diagnosis not present

## 2022-12-30 DIAGNOSIS — Z87891 Personal history of nicotine dependence: Secondary | ICD-10-CM | POA: Diagnosis not present

## 2022-12-30 HISTORY — PX: INGUINAL HERNIA REPAIR: SHX194

## 2022-12-30 SURGERY — REPAIR, HERNIA, INGUINAL, LAPAROSCOPIC
Anesthesia: General | Laterality: Left

## 2022-12-30 MED ORDER — MEPERIDINE HCL 50 MG/ML IJ SOLN: 6.2500 mg | INTRAMUSCULAR | Status: DC | PRN

## 2022-12-30 MED ORDER — LIDOCAINE 2% (20 MG/ML) 5 ML SYRINGE
INTRAMUSCULAR | Status: DC | PRN
  Administered 2022-12-30: 60 mg via INTRAVENOUS

## 2022-12-30 MED ORDER — CHLORHEXIDINE GLUCONATE 0.12 % MT SOLN
15.0000 mL | Freq: Once | OROMUCOSAL | Status: AC
  Administered 2022-12-30: 15 mL via OROMUCOSAL

## 2022-12-30 MED ORDER — DEXAMETHASONE SODIUM PHOSPHATE 10 MG/ML IJ SOLN
INTRAMUSCULAR | Status: DC | PRN
  Administered 2022-12-30: 4 mg via INTRAVENOUS

## 2022-12-30 MED ORDER — CEFAZOLIN SODIUM-DEXTROSE 2-4 GM/100ML-% IV SOLN
2.0000 g | INTRAVENOUS | Status: AC
  Administered 2022-12-30: 2 g via INTRAVENOUS
  Filled 2022-12-30: qty 100

## 2022-12-30 MED ORDER — HYDROMORPHONE HCL 1 MG/ML IJ SOLN
INTRAMUSCULAR | Status: AC
  Filled 2022-12-30: qty 1

## 2022-12-30 MED ORDER — ROCURONIUM BROMIDE 10 MG/ML (PF) SYRINGE
PREFILLED_SYRINGE | INTRAVENOUS | Status: AC
  Filled 2022-12-30: qty 10

## 2022-12-30 MED ORDER — CHLORHEXIDINE GLUCONATE CLOTH 2 % EX PADS: 6.0000 | MEDICATED_PAD | Freq: Once | CUTANEOUS | Status: DC

## 2022-12-30 MED ORDER — BUPIVACAINE HCL 0.25 % IJ SOLN
INTRAMUSCULAR | Status: AC
  Filled 2022-12-30: qty 1

## 2022-12-30 MED ORDER — PROPOFOL 10 MG/ML IV BOLUS
INTRAVENOUS | Status: AC
  Filled 2022-12-30: qty 20

## 2022-12-30 MED ORDER — ONDANSETRON HCL 4 MG/2ML IJ SOLN
INTRAMUSCULAR | Status: DC | PRN
  Administered 2022-12-30: 4 mg via INTRAVENOUS

## 2022-12-30 MED ORDER — FENTANYL CITRATE (PF) 100 MCG/2ML IJ SOLN
INTRAMUSCULAR | Status: DC | PRN
  Administered 2022-12-30: 100 ug via INTRAVENOUS

## 2022-12-30 MED ORDER — KETAMINE HCL 10 MG/ML IJ SOLN
INTRAMUSCULAR | Status: DC | PRN
  Administered 2022-12-30: 20 mg via INTRAVENOUS
  Administered 2022-12-30: 10 mg via INTRAVENOUS

## 2022-12-30 MED ORDER — ONDANSETRON HCL 4 MG/2ML IJ SOLN
INTRAMUSCULAR | Status: AC
  Filled 2022-12-30: qty 2

## 2022-12-30 MED ORDER — ONDANSETRON HCL 4 MG/2ML IJ SOLN: 4.0000 mg | Freq: Once | INTRAMUSCULAR | Status: DC | PRN

## 2022-12-30 MED ORDER — BUPIVACAINE-EPINEPHRINE (PF) 0.25% -1:200000 IJ SOLN
INTRAMUSCULAR | Status: DC | PRN
  Administered 2022-12-30: 30 mL

## 2022-12-30 MED ORDER — KETAMINE HCL 50 MG/5ML IJ SOSY
PREFILLED_SYRINGE | INTRAMUSCULAR | Status: AC
  Filled 2022-12-30: qty 5

## 2022-12-30 MED ORDER — EPHEDRINE SULFATE-NACL 50-0.9 MG/10ML-% IV SOSY
PREFILLED_SYRINGE | INTRAVENOUS | Status: DC | PRN
  Administered 2022-12-30 (×3): 5 mg via INTRAVENOUS

## 2022-12-30 MED ORDER — HYDROMORPHONE HCL 1 MG/ML IJ SOLN
0.2500 mg | INTRAMUSCULAR | Status: DC | PRN
  Administered 2022-12-30 (×4): 0.5 mg via INTRAVENOUS

## 2022-12-30 MED ORDER — PHENYLEPHRINE HCL-NACL 20-0.9 MG/250ML-% IV SOLN
INTRAVENOUS | Status: DC | PRN
  Administered 2022-12-30: 20 ug/min via INTRAVENOUS

## 2022-12-30 MED ORDER — ACETAMINOPHEN 500 MG PO TABS
1000.0000 mg | ORAL_TABLET | ORAL | Status: AC
  Administered 2022-12-30: 1000 mg via ORAL
  Filled 2022-12-30: qty 2

## 2022-12-30 MED ORDER — BUPIVACAINE LIPOSOME 1.3 % IJ SUSP
INTRAMUSCULAR | Status: DC | PRN
  Administered 2022-12-30: 20 mL

## 2022-12-30 MED ORDER — GABAPENTIN 300 MG PO CAPS
300.0000 mg | ORAL_CAPSULE | ORAL | Status: AC
  Administered 2022-12-30: 300 mg via ORAL

## 2022-12-30 MED ORDER — KETOROLAC TROMETHAMINE 30 MG/ML IJ SOLN: 30.0000 mg | Freq: Once | INTRAMUSCULAR | Status: DC | PRN

## 2022-12-30 MED ORDER — ENOXAPARIN SODIUM 40 MG/0.4ML IJ SOSY
40.0000 mg | PREFILLED_SYRINGE | Freq: Once | INTRAMUSCULAR | Status: AC
  Administered 2022-12-30: 40 mg via SUBCUTANEOUS
  Filled 2022-12-30: qty 0.4

## 2022-12-30 MED ORDER — ROCURONIUM BROMIDE 10 MG/ML (PF) SYRINGE
PREFILLED_SYRINGE | INTRAVENOUS | Status: DC | PRN
  Administered 2022-12-30: 5 mg via INTRAVENOUS
  Administered 2022-12-30: 60 mg via INTRAVENOUS

## 2022-12-30 MED ORDER — OXYCODONE-ACETAMINOPHEN 5-325 MG PO TABS: 1.0000 | ORAL_TABLET | ORAL | 0 refills | Status: DC | PRN

## 2022-12-30 MED ORDER — LACTATED RINGERS IV SOLN: INTRAVENOUS | Status: DC

## 2022-12-30 MED ORDER — SUGAMMADEX SODIUM 200 MG/2ML IV SOLN
INTRAVENOUS | Status: DC | PRN
  Administered 2022-12-30: 200 mg via INTRAVENOUS

## 2022-12-30 MED ORDER — MIDAZOLAM HCL 2 MG/2ML IJ SOLN
INTRAMUSCULAR | Status: AC
  Filled 2022-12-30: qty 2

## 2022-12-30 MED ORDER — ORAL CARE MOUTH RINSE: 15.0000 mL | Freq: Once | OROMUCOSAL | Status: AC

## 2022-12-30 MED ORDER — BUPIVACAINE-EPINEPHRINE 0.25% -1:200000 IJ SOLN
INTRAMUSCULAR | Status: AC
  Filled 2022-12-30: qty 1

## 2022-12-30 MED ORDER — PROPOFOL 10 MG/ML IV BOLUS
INTRAVENOUS | Status: DC | PRN
  Administered 2022-12-30: 200 mg via INTRAVENOUS

## 2022-12-30 MED ORDER — BUPIVACAINE LIPOSOME 1.3 % IJ SUSP
INTRAMUSCULAR | Status: AC
  Filled 2022-12-30: qty 20

## 2022-12-30 MED ORDER — DEXAMETHASONE SODIUM PHOSPHATE 10 MG/ML IJ SOLN
INTRAMUSCULAR | Status: AC
  Filled 2022-12-30: qty 1

## 2022-12-30 MED ORDER — KETOROLAC TROMETHAMINE 15 MG/ML IJ SOLN
15.0000 mg | INTRAMUSCULAR | Status: AC
  Administered 2022-12-30: 15 mg via INTRAVENOUS
  Filled 2022-12-30: qty 1

## 2022-12-30 MED ORDER — FENTANYL CITRATE (PF) 100 MCG/2ML IJ SOLN
INTRAMUSCULAR | Status: AC
  Filled 2022-12-30: qty 2

## 2022-12-30 MED ORDER — BUPIVACAINE LIPOSOME 1.3 % IJ SUSP: 20.0000 mL | Freq: Once | INTRAMUSCULAR | Status: DC

## 2022-12-30 MED ORDER — MIDAZOLAM HCL 2 MG/2ML IJ SOLN
INTRAMUSCULAR | Status: DC | PRN
  Administered 2022-12-30: 2 mg via INTRAVENOUS

## 2022-12-30 SURGICAL SUPPLY — 39 items
ADH SKN CLS APL DERMABOND .7 (GAUZE/BANDAGES/DRESSINGS) ×1
APL PRP STRL LF DISP 70% ISPRP (MISCELLANEOUS) ×1
BAG COUNTER SPONGE SURGICOUNT (BAG) ×2 IMPLANT
BAG SPNG CNTER NS LX DISP (BAG)
CABLE HIGH FREQUENCY MONO STRZ (ELECTRODE) ×2 IMPLANT
CHLORAPREP W/TINT 26 (MISCELLANEOUS) ×2 IMPLANT
DERMABOND ADVANCED .7 DNX12 (GAUZE/BANDAGES/DRESSINGS) ×2 IMPLANT
ELECT REM PT RETURN 15FT ADLT (MISCELLANEOUS) ×2 IMPLANT
GLOVE BIO SURGEON STRL SZ7.5 (GLOVE) ×2 IMPLANT
GLOVE INDICATOR 8.0 STRL GRN (GLOVE) ×2 IMPLANT
GOWN STRL REUS W/ TWL XL LVL3 (GOWN DISPOSABLE) ×4 IMPLANT
GOWN STRL REUS W/TWL XL LVL3 (GOWN DISPOSABLE) ×2
GRASPER SUT TROCAR 14GX15 (MISCELLANEOUS) ×2 IMPLANT
IRRIG SUCT STRYKERFLOW 2 WTIP (MISCELLANEOUS)
IRRIGATION SUCT STRKRFLW 2 WTP (MISCELLANEOUS) IMPLANT
KIT BASIN OR (CUSTOM PROCEDURE TRAY) ×2 IMPLANT
KIT TURNOVER KIT A (KITS) IMPLANT
MARKER SKIN DUAL TIP RULER LAB (MISCELLANEOUS) ×2 IMPLANT
MESH 3DMAX 5X7 LT XLRG (Mesh General) IMPLANT
NDL INSUFFLATION 14GA 120MM (NEEDLE) ×4 IMPLANT
NEEDLE INSUFFLATION 14GA 120MM (NEEDLE) ×2 IMPLANT
RELOAD STAPLE 4.0 BLU F/HERNIA (INSTRUMENTS) IMPLANT
RELOAD STAPLE 4.8 BLK F/HERNIA (STAPLE) ×2 IMPLANT
RELOAD STAPLE HERNIA 4.0 BLUE (INSTRUMENTS) IMPLANT
RELOAD STAPLE HERNIA 4.8 BLK (STAPLE) ×1 IMPLANT
SCISSORS LAP 5X35 DISP (ENDOMECHANICALS) ×2 IMPLANT
SET TUBE SMOKE EVAC HIGH FLOW (TUBING) ×2 IMPLANT
SPIKE FLUID TRANSFER (MISCELLANEOUS) IMPLANT
STAPLER HERNIA 12 8.5 360D (INSTRUMENTS) ×2 IMPLANT
SUT MNCRL AB 4-0 PS2 18 (SUTURE) ×2 IMPLANT
SUT V-LOC BARB 180 2/0GR6 GS22 (SUTURE) ×1
SUT VICRYL 0 UR6 27IN ABS (SUTURE) IMPLANT
SUTURE V-LC BRB 180 2/0GR6GS22 (SUTURE) IMPLANT
TOWEL OR 17X26 10 PK STRL BLUE (TOWEL DISPOSABLE) ×2 IMPLANT
TRAY FOL W/BAG SLVR 16FR STRL (SET/KITS/TRAYS/PACK) ×2 IMPLANT
TRAY FOLEY W/BAG SLVR 16FR LF (SET/KITS/TRAYS/PACK)
TRAY LAPAROSCOPIC (CUSTOM PROCEDURE TRAY) ×2 IMPLANT
TROCAR ADV FIXATION 12X100MM (TROCAR) ×2 IMPLANT
TROCAR Z-THREAD OPTICAL 5X100M (TROCAR) ×4 IMPLANT

## 2022-12-30 NOTE — Op Note (Signed)
   Patient: William Mason (1960/11/01, [id number])  Date of Surgery: 12/30/2022   Preoperative Diagnosis: RECURRENT LEFT INGUINAL HERNIA   Postoperative Diagnosis: RECURRENT LEFT INGUINAL HERNIA   Surgical Procedure: LAPAROSCOPIC RECURRENT LEFT INGUINAL HERNIA REPAIR WITH MESH: [id number]   Operative Team Members:  Surgeon(s) and Role:    * Berdell Hostetler, Hyman Hopes, MD - Primary   Anesthesiologist: Leilani Able, MD CRNA: Sindy Guadeloupe, CRNA   Anesthesia: General   Fluids:  Total I/O In: 100 [IV Piggyback:100] Out: -   Complications: None  Drains:  None  Specimen: None  Disposition:  PACU - hemodynamically stable.  Plan of Care: Discharge to home after PACU  Indications for Procedure: [name] is a 62 y.o. male who presented with a recurrent left inguinal hernia.  I recommended laparoscopic left inguinal hernia repair.  We discussed the procedure, its risks, benefits and alternatives.  After a full discussion and all questions answered, the patient granted consent to proceed.   Findings:  Technique: Transabdominal preperitoneal (TAPP) Hernia Location: Left indirect inguinal hernia containing omentum Mesh Size &Type:  Bard 3D max extra large left sided mesh Mesh Fixation: Endo-Universal hernia stapler  Infection status: Patient: Private Patient Elective Case Case: Elective Infection Present At Time Of Surgery (PATOS): None   Description of Procedure:  The patient was positioned supine, padded and secured to the bed, with both arms tucked.  The abdomen was widely prepped and draped.  A time out procedure was performed.  A 1 cm infraumbilical incision was made.  The abdomen was entered without trauma to the underlying viscera.  The abdomen was insufflated to 15 mm of Hg.  A 12 mm trocar was inserted at the periumbilical incision.  Additional 5 mm trocars were placed in the left and right abdomen.  There was no trauma to the underlying viscera.  There was an indirect  hernia on the LEFT containing omentum.  Utilizing a transabdominal pre peritoneal technique (TAPP), a horizontal incision was made in the peritoneum, immediately below the umbilicus.  Dissection was carried out in the pre peritoneal space down to the level of the hernia sac which was reduced into the peritoneal cavity completely.  The cord contents were parietalized and preserved.  A large pre peritoneal dissection was performed to uncover the direct, indirect, femoral and obturator spaces.  Cooper's ligament was uncovered medially and the psoas muscle uncovered laterally.  The mesh, as documented above, was opened and advanced into the pre peritoneal position so that it more than adequately covered the indirect, direct, femoral and obturator spaces.  The mesh laid flat, with no inferior folds and covered the entire myopectineal orifice.  The mesh was fixated with the endo-universal hernia stapler to Cooper's ligament and the posterior aspect of the rectus muscle.  The peritoneal flap was closed with the same device.  There were no peritoneal defects or exposed mesh at the conclusion.  The umbilical trocar was removed and the fascial defect was closed with a 0 Vicryl suture.  The peritoneal cavity was completely desufflated, the trocars removed and the skin closed with 4-0 Monocryl subcuticular suture and skin glue.  All sponge and needle counts were correct at the end of the case.  Ivar Drape, MD General, Bariatric, & Minimally Invasive Surgery [facility], [place]

## 2022-12-30 NOTE — Anesthesia Postprocedure Evaluation (Signed)
Anesthesia Post Note  Patient: William Mason  Procedure(s) Performed: LAPAROSCOPIC RECURRENT LEFT INGUINAL HERNIA REPAIR WITH MESH (Left)     Patient location during evaluation: Phase II Anesthesia Type: General Level of consciousness: awake Pain management: pain level controlled Vital Signs Assessment: post-procedure vital signs reviewed and stable Respiratory status: spontaneous breathing Cardiovascular status: stable Postop Assessment: no apparent nausea or vomiting Anesthetic complications: no  No notable events documented.  Last Vitals:  Vitals:   12/30/22 1115 12/30/22 1139  BP: 125/71 130/81  Pulse: 68 64  Resp: 17   Temp:  36.6 C  SpO2: 96% 95%    Last Pain:  Vitals:   12/30/22 1139  TempSrc:   PainSc: 0-No pain                 Caren Macadam

## 2022-12-30 NOTE — Transfer of Care (Signed)
Immediate Anesthesia Transfer of Care Note  Patient: William Mason  Procedure(s) Performed: LAPAROSCOPIC RECURRENT LEFT INGUINAL HERNIA REPAIR WITH MESH (Left)  Patient Location: PACU  Anesthesia Type:General  Level of Consciousness: awake, alert , and patient cooperative  Airway & Oxygen Therapy: Patient Spontanous Breathing and Patient connected to face mask oxygen  Post-op Assessment: Report given to RN and Post -op Vital signs reviewed and stable  Post vital signs: Reviewed and stable  Last Vitals:  Vitals Value Taken Time  BP 136/66 12/30/22 1020  Temp 35.9 C 12/30/22 1020  Pulse 65 12/30/22 1021  Resp 17 12/30/22 1021  SpO2 100 % 12/30/22 1021  Vitals shown include unvalidated device data.  Last Pain:  Vitals:   12/30/22 0702  TempSrc:   PainSc: 5       Patients Stated Pain Goal: 5 (12/30/22 1610)  Complications: No notable events documented.

## 2022-12-30 NOTE — H&P (Signed)
Admitting Physician: Hyman Hopes Rechy Bost  Service: General Surgery  CC: Recurrent left inguinal herni  Subjective   HPI: William Mason is an 62 y.o. male who is here for recurrent left inguinal hernia  Past Medical History:  Diagnosis Date   Allergy    Anxiety    Arthritis    "knees, shoulders, back" (04/27/2017)   Carotid artery stenosis    Chronic back pain    "5 ruptured discs in the middle; 1 ruptured disc in the lower" (04/27/2017)   Coronary artery disease    2 cardiac stents  oct 2018   Depression    Diverticulitis    Exertional heat stroke ~ 01/2017   Family history of adverse reaction to anesthesia    "mom had emergency appy at age 79; she had severe short term memory loss; never recovered"   Fatty liver disease, nonalcoholic    GERD (gastroesophageal reflux disease)    Glaucoma    laser treated   narrow angle   H/O degenerative disc disease    History of hiatal hernia    HLD (hyperlipidemia) 02/01/2019   Hyperlipidemia    Hypertension    OSA on CPAP    Pneumonia    PVC (premature ventricular contraction)    PVC (premature ventricular contraction)    Sleep apnea     Past Surgical History:  Procedure Laterality Date   COLONOSCOPY     have had 5 or more   CORONARY ANGIOPLASTY WITH STENT PLACEMENT  04/27/2017   x2    CORONARY STENT INTERVENTION N/A 04/27/2017   Procedure: CORONARY STENT INTERVENTION;  Surgeon: Elder Negus, MD;  [facility];  Service: Cardiovascular;  Laterality: N/A;   EYE SURGERY Left    laser   INGUINAL HERNIA REPAIR Left 07/03/2021   Procedure: OPEN LEFT INGUINAL HERNIA REPAIR WITH MESH;  Surgeon: Manus Rudd, MD;  Location: [facility];  Service: General;  Laterality: Left;   LEFT HEART CATH AND CORONARY ANGIOGRAPHY N/A 04/27/2017   Procedure: LEFT HEART CATH AND CORONARY ANGIOGRAPHY;  Surgeon: Elder Negus, MD;  [facility];  Service: Cardiovascular;  Laterality:  N/A;   LEFT HEART CATH AND CORONARY ANGIOGRAPHY N/A 08/29/2019   Procedure: LEFT HEART CATH AND CORONARY ANGIOGRAPHY;  Surgeon: Elder Negus, MD;  [facility];  Service: Cardiovascular;  Laterality: N/A;   REVISION TOTAL SHOULDER TO REVERSE TOTAL SHOULDER Left 05/09/2020   Procedure: LEFT SHOULDER CONVERSION OF TOTAL SHOULDER TO REVERSE ARTHROPLASTY;  Surgeon: Yolonda Kida, MD;  Location: [facility];  Service: Orthopedics;  Laterality: Left;   SHOULDER SURGERY Left ~ 1992   "tendon relocation; cut me open to do it"   SHOULDER SURGERY Right 08/04/2018   SHOULDER SURGERY Left 2021   SURGERY SCROTAL / TESTICULAR Left ~ 1980   "cyst removed"   TONSILLECTOMY     TOTAL SHOULDER ARTHROPLASTY Left 05/18/2019   Procedure: Left Shoulder hardware removal with Anatomic shoulder arthroplasty;  Surgeon: Francena Hanly, MD;  Location[facility];  Service: Orthopedics;  Laterality: Left;     Family History  Problem Relation Age of Onset   Heart disease Mother    Hypertension Mother    COPD Father    Heart disease Father    Breast cancer Maternal Grandmother    Leukemia Paternal Grandfather    Colon cancer Neg Hx    Esophageal cancer Neg Hx    Rectal cancer Neg Hx  Stomach cancer Neg Hx     Social:  reports that he has never smoked. He quit smokeless tobacco use about 44 years ago.  His smokeless tobacco use included chew. He reports that he does not currently use alcohol. He reports that he does not use drugs.  Allergies:  Allergies  Allergen Reactions   Hydrocodone Nausea Only   Imdur [Isosorbide Nitrate] Other (See Comments)    Severe headaches (d/c by provider)    Medications: Current Outpatient Medications  Medication Instructions   aspirin EC 81 mg, Oral, Daily, Swallow whole.   cetirizine (ZYRTEC) 10 mg, Oral, Daily   diazepam (VALIUM) 5 mg, Oral, Every 6 hours PRN   DULoxetine (CYMBALTA) 30 mg, Oral, 2 times daily   ezetimibe (ZETIA) 10 mg,  Oral, Daily   fluticasone (FLONASE) 50 MCG/ACT nasal spray INSTILL 2 SPRAYS INTO EACH NOSTRIL ONCE DAILY   metoprolol succinate (TOPROL-XL) 50 mg, Oral, Daily   nitroGLYCERIN (NITROSTAT) 0.4 mg, Sublingual, Every 5 min PRN   oxaprozin (DAYPRO) 600 mg, Oral, Daily   oxyCODONE-acetaminophen (PERCOCET) 5-325 MG tablet 1 tablet, Oral, Every 4 hours PRN   Polyethyl Glycol-Propyl Glycol 0.4-0.3 % SOLN 1-2 drops, Both Eyes, 3 times daily PRN   RABEprazole (ACIPHEX) 20 MG tablet TAKE 1 TABLET BY MOUTH ONE TIME DAILY   rosuvastatin (CRESTOR) 20 mg, Oral, Daily    ROS - all of the below systems have been reviewed with the patient and positives are indicated with bold text General: chills, fever or night sweats Eyes: blurry vision or double vision ENT: epistaxis or sore throat Allergy/Immunology: itchy/watery eyes or nasal congestion Hematologic/Lymphatic: bleeding problems, blood clots or swollen lymph nodes Endocrine: temperature intolerance or unexpected weight changes Breast: new or changing breast lumps or nipple discharge Resp: cough, shortness of breath, or wheezing CV: chest pain or dyspnea on exertion GI: as per HPI GU: dysuria, trouble voiding, or hematuria MSK: joint pain or joint stiffness Neuro: TIA or stroke symptoms Derm: pruritus and skin lesion changes Psych: anxiety and depression  Objective   PE Blood pressure 133/72, pulse (!) 58, temperature 98 F (36.7 C), temperature source Oral, resp. rate 18, height 5\' 11"  (1.803 m), weight 99.8 kg, SpO2 99 %. Constitutional: NAD; conversant; no deformities Eyes: Moist conjunctiva; no lid lag; anicteric; PERRL Neck: Trachea midline; no thyromegaly Lungs: Normal respiratory effort; no tactile fremitus CV: RRR; no palpable thrills; no pitting edema GI: Abd Left inguinal hernia; no palpable hepatosplenomegaly MSK: Normal range of motion of extremities; no clubbing/cyanosis Psychiatric: Appropriate affect; alert and oriented  x3 Lymphatic: No palpable cervical or axillary lymphadenopathy  No results found for this or any previous visit (from the past 24 hour(s)).  Imaging Orders  No imaging studies ordered today     Assessment and Plan   William Mason is an 62 y.o. male with a recurrent left inguinal hernia.  I recommended laparoscopic left inguinal hernia repair with mesh.  We discussed the procedure, its risks, benefits and alternatives. After a full discussion and all questions answered the patient granted consent to proceed.  Quentin Ore, MD  [facility] Use [email or web address] to contact on call provider

## 2022-12-30 NOTE — Discharge Instructions (Signed)
 GROIN HERNIA REPAIR POST OPERATIVE INSTRUCTIONS  Thinking Clearly  The anesthesia may cause you to feel different for 1 or 2 days. Do not drive, drink alcohol, or make any big decisions for at least 2 days.  Nutrition When you wake up, you will be able to drink small amounts of liquid. If you do not feel sick, you can slowly advance your diet to regular foods. Continue to drink lots of fluids, usually about 8 to 10 glasses per day. Eat a high-fiber diet so you don't strain during bowel movements. High-Fiber Foods Foods high in fiber include beans, bran cereals and whole-grain breads, peas, dried fruit (figs, apricots, and dates), raspberries, blackberries, strawberries, sweet corn, broccoli, baked potatoes with skin, plums, pears, apples, greens, and nuts. Activity Slowly increase your activity. Be sure to get up and walk every hour or so to prevent blood clots. No heavy lifting or strenuous activity for 4 weeks following surgery to prevent hernias at your incision sites or recurrence of your hernia. It is normal to feel tired. You may need more sleep than usual.  Get your rest but make sure to get up and move around frequently to prevent blood clots and pneumonia.  Work and Return to School You can go back to work when you feel well enough. Discuss the timing with your surgeon. You can usually go back to school or work 1 week or less after an laparoscopic or an open repair. If your work requires heavy lifting or strenuous activity you need to be placed on light duty for 4 weeks following surgery. You can return to gym class, sports or other physical activities 4 weeks after surgery.  Wound Care You may experience significant bruising in the groin including into the scrotum in males.  Rest, elevating the groin and scrotum above the level of the heart, ice and compression with tight fitting underwear can help.  Always wash your hands before and after touching near your incision site. Do  not soak in a bathtub until cleared at your follow up appointment. You may take a shower 24 hours after surgery. A small amount of drainage from the incision is normal. If the drainage is thick and yellow or the site is red, you may have an infection, so call your surgeon. If you have a drain in one of your incisions, it will be taken out in office when the drainage stops. Steri-Strips will fall off in 7 to 10 days or they will be removed during your first office visit. If you have dermabond glue covering over the incision, allow the glue to flake off on its own. Protect the new skin, especially from the sun. The sun can burn and cause darker scarring. Your scar will heal in about 4 to 6 weeks and will become softer and continue to fade over the next year.  The cosmetic appearance of the incisions will improve over the course of the first year after surgery. Sensation around your incision will return in a few weeks or months.  Bowel Movements After intestinal surgery, you may have loose watery stools for several days. If watery diarrhea lasts longer than 3 days, contact your surgeon. Pain medication (narcotics) can cause constipation. Increase the fiber in your diet with high-fiber foods if you are constipated. You can take an over the counter stool softener like Colace to avoid constipation.  Additional over the counter medications can also be used if Colace isn't sufficient (for example, Milk of Magnesia or Miralax).    Pain The amount of pain is different for each person. Some people need only 1 to 3 doses of pain control medication, while others need more. Take alternating doses of tylenol and ibuprofen around the clock for the first five days following surgery.  This will provide a baseline of pain control and help with inflammation.  Take the narcotic pain medication in addition if needed for severe pain.  Contact Your Surgeon at [phone], if you have: Pain that will not go away Pain that  gets worse A fever of more than 101F (38.3C) Repeated vomiting Swelling, redness, bleeding, or bad-smelling drainage from your wound site Strong abdominal pain No bowel movement or unable to pass gas for 3 days Watery diarrhea lasting longer than 3 days  Pain Control The goal of pain control is to minimize pain, keep you moving and help you heal. Your surgical team will work with you on your pain plan. Most often a combination of therapies and medications are used to control your pain. You may also be given medication (local anesthetic) at the surgical site. This may help control your pain for several days. Extreme pain puts extra stress on your body at a time when your body needs to focus on healing. Do not wait until your pain has reached a level "10" or is unbearable before telling your doctor or nurse. It is much easier to control pain before it becomes severe. Following a laparoscopic procedure, pain is sometimes felt in the shoulder. This is due to the gas inserted into your abdomen during the procedure. Moving and walking helps to decrease the gas and the right shoulder pain.  Use the guide below for ways to manage your post-operative pain. Learn more by going to facs.org/safepaincontrol.  How Intense Is My Pain Common Therapies to Feel Better       I hardly notice my pain, and it does not interfere with my activities.  I notice my pain and it distracts me, but I can still do activities (sitting up, walking, standing).  Non-Medication Therapies  Ice (in a bag, applied over clothing at the surgical site), elevation, rest, meditation, massage, distraction (music, TV, play) walking and mild exercise Splinting the abdomen with pillows +  Non-Opioid Medications Acetaminophen (Tylenol) Non-steroidal anti-inflammatory drugs (NSAIDS) Aspirin, Ibuprofen (Motrin, Advil) Naproxen (Aleve) Take these as needed, when you feel pain. Both acetaminophen and NSAIDs help to decrease pain  and swelling (inflammation).      My pain is hard to ignore and is more noticeable even when I rest.  My pain interferes with my usual activities.  Non-Medication Therapies  +  Non-Opioid medications  Take on a regular schedule (around-the-clock) instead of as needed. (For example, Tylenol every 6 hours at 9:00 am, 3:00 pm, 9:00 pm, 3:00 am and Motrin every 6 hours at 12:00 am, 6:00 am, 12:00 pm, 6:00 pm)         I am focused on my pain, and I am not doing my daily activities.  I am groaning in pain, and I cannot sleep. I am unable to do anything.  My pain is as bad as it could be, and nothing else matters.  Non-Medication Therapies  +  Around-the-Clock Non-Opioid Medications  +  Short-acting opioids  Opioids should be used with other medications to manage severe pain. Opioids block pain and give a feeling of euphoria (feel high). Addiction, a serious side effect of opioids, is rare with short-term (a few days) use.  Examples of short-acting opioids   include: Tramadol (Ultram), Hydrocodone (Norco, Vicodin), Hydromorphone (Dilaudid), Oxycodone (Oxycontin)     The above directions have been adapted from the American College [employer] Surgical Patient Education Program.  Please refer to the ACS website if needed: [email or web address]   Jerrad Mendibles, MD [facility] [address], [address], [place], [place]  [address] ?  [address] [address], [place], [place]   [address] [phone] ? [phone] ? FAX [phone] Web site: [email or web address]  

## 2022-12-30 NOTE — Anesthesia Procedure Notes (Addendum)
Procedure Name: Intubation Date/Time: 12/30/2022 8:53 AM  Performed by: Sindy Guadeloupe, CRNAPre-anesthesia Checklist: Patient identified, Emergency Drugs available, Suction available, Patient being monitored and Timeout performed Patient Re-evaluated:Patient Re-evaluated prior to induction Oxygen Delivery Method: Circle system utilized Preoxygenation: Pre-oxygenation with 100% oxygen Induction Type: IV induction Ventilation: Mask ventilation without difficulty Laryngoscope Size: Mac and 4 Grade View: Grade III Tube type: Oral Tube size: 7.5 mm Number of attempts: 1 Airway Equipment and Method: Stylet Placement Confirmation: ETT inserted through vocal cords under direct vision, positive ETCO2 and breath sounds checked- equal and bilateral Secured at: 23 cm Tube secured with: Tape Dental Injury: Teeth and Oropharynx as per pre-operative assessment  Comments: Easy mask, DVL x 1, large epiglottis, anterior, atraumatic intubation

## 2022-12-31 ENCOUNTER — Encounter (HOSPITAL_COMMUNITY): Payer: Self-pay | Admitting: Surgery

## 2023-01-25 ENCOUNTER — Encounter (HOSPITAL_COMMUNITY): Payer: Self-pay | Admitting: Surgery

## 2023-02-05 ENCOUNTER — Ambulatory Visit: Payer: Managed Care, Other (non HMO)

## 2023-02-05 DIAGNOSIS — I6522 Occlusion and stenosis of left carotid artery: Secondary | ICD-10-CM

## 2023-02-10 ENCOUNTER — Other Ambulatory Visit: Payer: Self-pay | Admitting: Cardiology

## 2023-02-11 ENCOUNTER — Ambulatory Visit: Payer: Managed Care, Other (non HMO) | Admitting: Cardiology

## 2023-02-17 ENCOUNTER — Other Ambulatory Visit: Payer: Self-pay | Admitting: Cardiology

## 2023-02-24 ENCOUNTER — Other Ambulatory Visit: Payer: Managed Care, Other (non HMO)

## 2023-03-05 ENCOUNTER — Ambulatory Visit: Payer: Managed Care, Other (non HMO) | Admitting: Cardiology

## 2023-03-05 ENCOUNTER — Encounter: Payer: Self-pay | Admitting: Cardiology

## 2023-03-05 VITALS — BP 131/77 | HR 64 | Resp 16 | Ht 71.0 in | Wt 224.0 lb

## 2023-03-05 DIAGNOSIS — I6522 Occlusion and stenosis of left carotid artery: Secondary | ICD-10-CM

## 2023-03-05 DIAGNOSIS — I25118 Atherosclerotic heart disease of native coronary artery with other forms of angina pectoris: Secondary | ICD-10-CM

## 2023-03-05 DIAGNOSIS — I2510 Atherosclerotic heart disease of native coronary artery without angina pectoris: Secondary | ICD-10-CM

## 2023-03-05 MED ORDER — ASPIRIN 81 MG PO TBEC: 81.0000 mg | DELAYED_RELEASE_TABLET | Freq: Every day | ORAL | 3 refills | Status: DC

## 2023-03-05 MED ORDER — NITROGLYCERIN 0.4 MG SL SUBL
0.4000 mg | SUBLINGUAL_TABLET | SUBLINGUAL | 2 refills | Status: AC | PRN
Start: 2023-03-05 — End: 2024-03-17

## 2023-03-05 MED ORDER — EZETIMIBE 10 MG PO TABS: 10.0000 mg | ORAL_TABLET | Freq: Every day | ORAL | 3 refills | Status: DC

## 2023-03-05 MED ORDER — ROSUVASTATIN CALCIUM 20 MG PO TABS: 20.0000 mg | ORAL_TABLET | Freq: Every day | ORAL | 3 refills | Status: DC

## 2023-03-05 MED ORDER — METOPROLOL SUCCINATE ER 50 MG PO TB24: 50.0000 mg | ORAL_TABLET | Freq: Every day | ORAL | 3 refills | Status: DC

## 2023-03-05 NOTE — Progress Notes (Signed)
lipi   Subjective:   William Mason, male    DOB: 11-Aug-1960, 62 y.o.   MRN: [id number]    Chief complaint:  Coronary artery disease   HPI  62 y/o Caucasian male with CAD s/p LCx/OM PCI 04/2017, mod asympytomatic lt carotid stenosis, hypertension, hyperlipidemia.  Patient is doing well.  He is slowly resuming his physical activity after hernia surgeries.  He denies any chest pain or shortness of breath symptoms.  Reviewed recent carotid duplex results with the patient, details below.    Current Outpatient Medications:    aspirin EC 81 MG tablet, TAKE 1 TABLET (81 MG TOTAL) BY MOUTH DAILY. SWALLOW WHOLE., Disp: 90 tablet, Rfl: 3   cetirizine (ZYRTEC) 10 MG tablet, Take 10 mg by mouth daily., Disp: , Rfl:    diazepam (VALIUM) 5 MG tablet, Take 5 mg by mouth every 6 (six) hours as needed (lower back spasms)., Disp: , Rfl:    DULoxetine (CYMBALTA) 30 MG capsule, Take 1 capsule (30 mg total) by mouth 2 (two) times daily., Disp: 90 capsule, Rfl: 3   ezetimibe (ZETIA) 10 MG tablet, TAKE 1 TABLET BY MOUTH EVERY DAY, Disp: 90 tablet, Rfl: 4   fluticasone (FLONASE) 50 MCG/ACT nasal spray, INSTILL 2 SPRAYS INTO EACH NOSTRIL ONCE DAILY (Patient taking differently: Place 2 sprays into both nostrils daily.), Disp: 48 mL, Rfl: 3   metoprolol succinate (TOPROL-XL) 50 MG 24 hr tablet, TAKE 1 TABLET BY MOUTH EVERY DAY, Disp: 90 tablet, Rfl: 0   nitroGLYCERIN (NITROSTAT) 0.4 MG SL tablet, Place 1 tablet (0.4 mg total) under the tongue every 5 (five) minutes as needed for chest pain. (Patient not taking: Reported on 12/15/2022), Disp: 25 tablet, Rfl: 2   oxaprozin (DAYPRO) 600 MG tablet, Take 1 tablet (600 mg total) by mouth daily. (Patient taking differently: Take 600 mg by mouth in the morning and at bedtime.), Disp: 90 tablet, Rfl: 1   oxyCODONE-acetaminophen (PERCOCET) 5-325 MG tablet, Take 1 tablet by mouth every 4 (four) hours as needed (max 6 q)., Disp: 30 tablet, Rfl: 0   oxyCODONE-acetaminophen  (PERCOCET) 5-325 MG tablet, Take 1 tablet by mouth every 4 (four) hours as needed for severe pain., Disp: 20 tablet, Rfl: 0   Polyethyl Glycol-Propyl Glycol 0.4-0.3 % SOLN, Place 1-2 drops into both eyes 3 (three) times daily as needed (eye irritation). , Disp: , Rfl:    RABEprazole (ACIPHEX) 20 MG tablet, TAKE 1 TABLET BY MOUTH ONE TIME DAILY (Patient taking differently: Take 20 mg by mouth daily.), Disp: 90 tablet, Rfl: 1   rosuvastatin (CRESTOR) 20 MG tablet, TAKE 1 TABLET BY MOUTH EVERY DAY, Disp: 90 tablet, Rfl: 0  Cardiovascular studies:  EKG 03/05/2023: Sinus rhythm 67 bpm with PACs Right bundle branch block  Carotid artery duplex 02/05/2023:  Duplex suggests stenosis in the right internal carotid artery (minimal).  Duplex suggests stenosis in the left internal carotid artery (1-15%).  There is mild homogenous plaquing in the bilateral ICA.  Antegrade right vertebral artery flow. Antegrade left vertebral artery  flow.  Compared to 01/26/2022, left ICA stenosis of 16 to 49% has regressed.   Further studies if clinically indicated.   Echocardiogram 03/13/2022: Normal LV systolic function with visual EF 60-65%. Left ventricle cavity is normal in size. Normal left ventricular wall thickness. Normal global wall motion. Normal diastolic filling pattern, normal LAP. No significant valvular heart disease. Compared to 04/01/2017 no significant change.  Carotid artery duplex 01/26/2022:  Duplex suggests stenosis in the right internal carotid  artery (minimal).  Duplex suggests stenosis in the left internal carotid artery (16-49%).  Homogeneous plaque bilateral carotid arteries.  Antegrade right vertebral artery flow. Antegrade left vertebral artery  flow.  Compared to the study done on 01/29/2021, left ICA stenosis has decreased  from 50 to 69%. Follow up in one year is appropriate if clinically  indicated.   Coronary angiography 08/29/2019: LM: Normal LAD: Prox and mid 20% focal  stenoses LCx: Patent large OM2 stents. OM1 Synergy DES 3.0 X 12 mm & 3.5 X 12 mm. Rest normal        Pinched ostium of a small lateral branch at the stent site, with TIMI III flow. Unchanged compared to post PCI        angiogram in 2018.  RCA: Focal eccentric 40% stenosis. Unchanged compared to previous angiogram in 2018.   Normal LVEF and LVEDP   Recent labs: 12/15/2022: Glucose 157, BUN/Cr 22/1.4. EGFR 56. Na/K 137/4.9.  H/H 15/47. MCV 92. Platelets 202  Jan-April 2023: BUN/Cr 18/1.3. EGFR 68.  Chol 155, TG 218, HDL 43, LDL ?? TSH 1.8 normal  05/10/2020: Glucose 175, BUN/Cr 17/1.14. EGFR >60. Na/K 137/4.4.  H/H 15/47. MCV 92. Platelets 185  Review of Systems  Cardiovascular:  Negative for chest pain, dyspnea on exertion, leg swelling, palpitations and syncope.    Vitals:   03/05/23 1100  BP: 131/77  Pulse: 64  Resp: 16  SpO2: 98%      Objective:    Physical Exam Vitals and nursing note reviewed.  Constitutional:      General: He is not in acute distress. Neck:     Vascular: No JVD.  Cardiovascular:     Rate and Rhythm: Normal rate and regular rhythm.     Pulses:          Carotid pulses are  on the left side with bruit.    Heart sounds: Normal heart sounds. No murmur heard. Pulmonary:     Effort: Pulmonary effort is normal.     Breath sounds: Normal breath sounds. No wheezing or rales.  Musculoskeletal:     Right lower leg: No edema.     Left lower leg: No edema.        Assessment & Recommendations:   62 y/o Caucasian male with CAD s/p LCx/OM PCI 04/2017, mod asympytomatic lt carotid stenosis, hypertension, hyperlipidemia.  CAD: No angina symptoms Continue aspirin, crestor + zetia, metoprolol succinate. Will check lipid panel.  Mod lt ICA stenosis: Asymptomatic, improved. Continue aggressive medical management. Repeat carotid duplex in 1 year  F/u in 1 year    Elder Negus, MD Pager: [phone] Office: [phone]

## 2023-04-06 ENCOUNTER — Ambulatory Visit (INDEPENDENT_AMBULATORY_CARE_PROVIDER_SITE_OTHER): Payer: Managed Care, Other (non HMO) | Admitting: Gastroenterology

## 2023-04-06 ENCOUNTER — Ambulatory Visit (HOSPITAL_BASED_OUTPATIENT_CLINIC_OR_DEPARTMENT_OTHER)
Admission: RE | Admit: 2023-04-06 | Discharge: 2023-04-06 | Disposition: A | Discharge status: Home / Self Care | Payer: Managed Care, Other (non HMO) | Source: Ambulatory Visit | Attending: Gastroenterology | Admitting: Gastroenterology

## 2023-04-06 ENCOUNTER — Other Ambulatory Visit (INDEPENDENT_AMBULATORY_CARE_PROVIDER_SITE_OTHER): Payer: Managed Care, Other (non HMO)

## 2023-04-06 ENCOUNTER — Encounter: Payer: Self-pay | Admitting: Gastroenterology

## 2023-04-06 VITALS — BP 124/72 | HR 65 | Ht 71.0 in | Wt 214.4 lb

## 2023-04-06 DIAGNOSIS — K5732 Diverticulitis of large intestine without perforation or abscess without bleeding: Secondary | ICD-10-CM | POA: Insufficient documentation

## 2023-04-06 DIAGNOSIS — R1032 Left lower quadrant pain: Secondary | ICD-10-CM | POA: Diagnosis not present

## 2023-04-06 LAB — BASIC METABOLIC PANEL
BUN: 13 mg/dL (ref 6–23)
CO2: 29 meq/L (ref 19–32)
Calcium: 9.6 mg/dL (ref 8.4–10.5)
Chloride: 104 meq/L (ref 96–112)
Creatinine, Ser: 1.05 mg/dL (ref 0.40–1.50)
GFR: 75.04 mL/min (ref >60.00)
Glucose, Bld: 130 mg/dL — ABNORMAL HIGH (ref 70–99)
Potassium: 4.3 meq/L (ref 3.5–5.1)
Sodium: 140 meq/L (ref 135–145)

## 2023-04-06 MED ORDER — IOHEXOL 300 MG/ML  SOLN
100.0000 mL | Freq: Once | INTRAMUSCULAR | Status: AC | PRN
  Administered 2023-04-06: 100 mL via INTRAVENOUS

## 2023-04-06 NOTE — Patient Instructions (Signed)
Your provider has requested that you go to the basement level for lab work before leaving today. Press "B" on the elevator. The lab is located at the first door on the left as you exit the elevator.   You have been scheduled for a CT scan of the abdomen and pelvis at [facility]. You are scheduled on today  at 4 pm. Please arrive at 2 pm to drink oral contrast.   Please follow the written instructions below on the day of your exam:   1) Do not eat anything after 12 pm (4 hours prior to your test)   You may take any medications as prescribed with a small amount of water, if necessary. If you take any of the following medications: METFORMIN, GLUCOPHAGE, GLUCOVANCE, AVANDAMET, RIOMET, FORTAMET, ACTOPLUS MET, JANUMET, GLUMETZA or METAGLIP, you MAY be asked to HOLD this medication 48 hours AFTER the exam.   The purpose of you drinking the oral contrast is to aid in the visualization of your intestinal tract. The contrast solution may cause some diarrhea. Depending on your individual set of symptoms, you may also receive an intravenous injection of x-ray contrast/dye. Plan on being at [facility] for 45 minutes or longer, depending on the type of exam you are having performed.   If you have any questions regarding your exam or if you need to reschedule, you may call [facility] at [phone] between the hours of 8:00 am and 5:00 pm, Monday-Friday.

## 2023-04-06 NOTE — Progress Notes (Signed)
Attempted to call results  of CT Abdomen and Pelvis to ordering provider  Doug Sou, PA at [facility] at 17:27. Left message with answering service Thayer Ohm). Was contacted by Doug Sou thru secure message that she had viewed the patient's results and was in the process of contacting patient.

## 2023-04-06 NOTE — Progress Notes (Signed)
04/06/2023 William Mason [id number] [birth year]   HISTORY OF PRESENT ILLNESS: This is a 62 year old male who is a patient of Dr. Ardell Isaacs.  He is actually due for colonoscopy coming up in November.  He is here today with complaints of lower abdominal pain and suspected diverticulitis.  He tells me that he has had diverticulitis in the past, but this episode began a few weeks ago, before Labor Day.  He was seen at urgent care and they treated him empirically for diverticulitis with a 10-day course of Cipro and Flagyl.  He also had COVID at the time.  Continued to have pain so then was given a 10-day course of Augmentin.  He completed that as well, but still continued to have pain and was also having fevers and chills.  He was put on another course of Cipro and Flagyl that he just started on Sunday, so 2 days ago.  He is feeling slightly better today, but still having a lot of lower abdominal pain.  No imaging has been performed.  He has had no rectal bleeding, but has not been passing gas like usual and stools have been different, also possibly due to antibiotics.  Of note, he did have a left inguinal hernia repair and umbilical hernia repair in June.  Past Medical History:  Diagnosis Date   Allergy    Anxiety    Arthritis    "knees, shoulders, back" (04/27/2017)   Carotid artery stenosis    Chronic back pain    "5 ruptured discs in the middle; 1 ruptured disc in the lower" (04/27/2017)   Coronary artery disease    2 cardiac stents  oct 2018   Depression    Diverticulitis    Exertional heat stroke ~ 01/2017   Family history of adverse reaction to anesthesia    "mom had emergency appy at age 78; she had severe short term memory loss; never recovered"   Fatty liver disease, nonalcoholic    GERD (gastroesophageal reflux disease)    Glaucoma    laser treated   narrow angle   H/O degenerative disc disease    History of hiatal hernia    HLD (hyperlipidemia) 02/01/2019   Hyperlipidemia     Hypertension    OSA on CPAP    Pneumonia    PVC (premature ventricular contraction)    PVC (premature ventricular contraction)    Sleep apnea    Past Surgical History:  Procedure Laterality Date   COLONOSCOPY     have had 5 or more   CORONARY ANGIOPLASTY WITH STENT PLACEMENT  04/27/2017   x2    CORONARY STENT INTERVENTION N/A 04/27/2017   Procedure: CORONARY STENT INTERVENTION;  Surgeon: Elder Negus, MD;  Location: [facility];  Service: Cardiovascular;  Laterality: N/A;   EYE SURGERY Left    laser   INGUINAL HERNIA REPAIR Left 07/03/2021   Procedure: OPEN LEFT INGUINAL HERNIA REPAIR WITH MESH;  Surgeon: Manus Rudd, MD;  Location: [facility];  Service: General;  Laterality: Left;   INGUINAL HERNIA REPAIR Left 12/30/2022   Procedure: LAPAROSCOPIC RECURRENT LEFT INGUINAL HERNIA REPAIR WITH MESH;  Surgeon: Quentin Ore, MD;  Location: [facility];  Service: General;  Laterality: Left;   LEFT HEART CATH AND CORONARY ANGIOGRAPHY N/A 04/27/2017   Procedure: LEFT HEART CATH AND CORONARY ANGIOGRAPHY;  Surgeon: Elder Negus, MD;  Location: [facility];  Service: Cardiovascular;  Laterality: N/A;   LEFT HEART CATH AND CORONARY  ANGIOGRAPHY N/A 08/29/2019   Procedure: LEFT HEART CATH AND CORONARY ANGIOGRAPHY;  Surgeon: Elder Negus, MD;  Location: [facility];  Service: Cardiovascular;  Laterality: N/A;   REVISION TOTAL SHOULDER TO REVERSE TOTAL SHOULDER Left 05/09/2020   Procedure: LEFT SHOULDER CONVERSION OF TOTAL SHOULDER TO REVERSE ARTHROPLASTY;  Surgeon: Yolonda Kida, MD;  Location: [facility];  Service: Orthopedics;  Laterality: Left;   SHOULDER SURGERY Left ~ 1992   "tendon relocation; cut me open to do it"   SHOULDER SURGERY Right 08/04/2018   SHOULDER SURGERY Left 2021   SURGERY SCROTAL / TESTICULAR Left ~ 1980   "cyst removed"   TONSILLECTOMY     TOTAL SHOULDER ARTHROPLASTY Left 05/18/2019   Procedure: Left  Shoulder hardware removal with Anatomic shoulder arthroplasty;  Surgeon: Francena Hanly, MD;  Location: [facility];  Service: Orthopedics;  Laterality: Left;     reports that he has never smoked. He quit smokeless tobacco use about 44 years ago.  His smokeless tobacco use included chew. He reports that he does not currently use alcohol. He reports that he does not use drugs. family history includes Breast cancer in his maternal grandmother; COPD in his father; Heart disease in his father and mother; Hypertension in his mother; Leukemia in his paternal grandfather. Allergies  Allergen Reactions   Hydrocodone Nausea Only   Imdur [Isosorbide Nitrate] Other (See Comments)    Severe headaches (d/c by provider)      Outpatient Encounter Medications as of 04/06/2023  Medication Sig   cetirizine (ZYRTEC) 10 MG tablet Take 10 mg by mouth daily.   ciprofloxacin (CIPRO) 500 MG tablet Take 500 mg by mouth 2 (two) times daily.   diazepam (VALIUM) 5 MG tablet Take 5 mg by mouth every 6 (six) hours as needed (lower back spasms).   ezetimibe (ZETIA) 10 MG tablet Take 1 tablet (10 mg total) by mouth daily.   fluticasone (FLONASE) 50 MCG/ACT nasal spray INSTILL 2 SPRAYS INTO EACH NOSTRIL ONCE DAILY (Patient taking differently: Place 2 sprays into both nostrils daily.)   metoprolol succinate (TOPROL-XL) 50 MG 24 hr tablet Take 1 tablet (50 mg total) by mouth daily.   metroNIDAZOLE (FLAGYL) 500 MG tablet Take 500 mg by mouth 2 (two) times daily.   nitroGLYCERIN (NITROSTAT) 0.4 MG SL tablet Place 1 tablet (0.4 mg total) under the tongue every 5 (five) minutes as needed for chest pain.   oxyCODONE-acetaminophen (PERCOCET) 5-325 MG tablet Take 1 tablet by mouth every 4 (four) hours as needed (max 6 q).   oxyCODONE-acetaminophen (PERCOCET) 5-325 MG tablet Take 1 tablet by mouth every 4 (four) hours as needed for severe pain.   Polyethyl Glycol-Propyl Glycol 0.4-0.3 % SOLN Place 1-2 drops into both eyes 3 (three)  times daily as needed (eye irritation).    RABEprazole (ACIPHEX) 20 MG tablet TAKE 1 TABLET BY MOUTH ONE TIME DAILY (Patient taking differently: Take 20 mg by mouth daily.)   aspirin EC 81 MG tablet Take 1 tablet (81 mg total) by mouth daily. Swallow whole. (Patient not taking: Reported on 04/06/2023)   oxaprozin (DAYPRO) 600 MG tablet Take 1 tablet (600 mg total) by mouth daily. (Patient not taking: Reported on 04/06/2023)   rosuvastatin (CRESTOR) 20 MG tablet Take 1 tablet (20 mg total) by mouth daily. (Patient not taking: Reported on 04/06/2023)   [DISCONTINUED] DULoxetine (CYMBALTA) 30 MG capsule Take 1 capsule (30 mg total) by mouth 2 (two) times daily.   No facility-administered encounter medications on  file as of 04/06/2023.    REVIEW OF SYSTEMS  : All other systems reviewed and negative except where noted in the History of Present Illness.   PHYSICAL EXAM: BP 124/72   Pulse 65   Ht 5\' 11"  (1.803 m)   Wt 214 lb 6.4 oz (97.3 kg)   BMI 29.90 kg/m  General: Well developed white male in no acute distress Head: Normocephalic and atraumatic Eyes:  Sclerae anicteric, conjunctiva pink. Ears: Normal auditory acuity Lungs: Clear throughout to auscultation; no W/R/R. Heart: Regular rate and rhythm; no M/R/G. Abdomen: Soft, non-distended.  BS present.  Moderate tenderness to palpation in lower abdomen, mostly suprapubic region. Musculoskeletal: Symmetrical with no gross deformities  Skin: No lesions on visible extremities Extremities: No edema  Neurological: Alert oriented x 4, grossly non-focal Psychological:  Alert and cooperative. Normal mood and affect  ASSESSMENT AND PLAN: *LLQ abdominal pain, suspect diverticulitis:  Has already been on a round of cipro and flagyl and a round of augmentin without resolution of symptoms.  Now on another round of cipro and flagyl, quite tender on exam.  Will get a stat CT scan today to be sure no complication such as abscess, etc.  Check BMP.   CC:   No ref. provider found

## 2023-04-07 ENCOUNTER — Other Ambulatory Visit: Payer: Self-pay

## 2023-04-07 ENCOUNTER — Telehealth: Payer: Self-pay | Admitting: Gastroenterology

## 2023-04-07 MED ORDER — HYOSCYAMINE SULFATE 0.125 MG SL SUBL: 0.1250 mg | SUBLINGUAL_TABLET | Freq: Four times a day (QID) | SUBLINGUAL | 0 refills | Status: DC | PRN

## 2023-04-07 MED ORDER — METRONIDAZOLE 500 MG PO TABS: 500.0000 mg | ORAL_TABLET | Freq: Three times a day (TID) | ORAL | 0 refills | Status: DC

## 2023-04-07 NOTE — Telephone Encounter (Signed)
Inbound call from patient stating that his Cipro prescriptions for  500 mg twice daily.   Flagyl is 500 twice daily and he has  adjusted the dosage and will not have enough  to finsh the corse.    Patient stated that [facility] sent a message to him yesterday wanting to know information about those two prescriptions.

## 2023-04-07 NOTE — Telephone Encounter (Signed)
Patient states he will be 6 pills of Flagyl short. Sent in script to pharmacy.

## 2023-04-27 ENCOUNTER — Telehealth: Payer: Self-pay | Admitting: Gastroenterology

## 2023-04-27 MED ORDER — AMOXICILLIN-POT CLAVULANATE 875-125 MG PO TABS: 1.0000 | ORAL_TABLET | Freq: Two times a day (BID) | ORAL | 0 refills | Status: DC

## 2023-04-27 NOTE — Telephone Encounter (Signed)
Left message on machine to call back  

## 2023-04-27 NOTE — Telephone Encounter (Signed)
Message left for pt - made aware that prescription has been sent to his pharmacy. He is also aware to maintain a soft diet until symptoms improve.

## 2023-04-27 NOTE — Telephone Encounter (Signed)
Dr Russella Dar can you please review in Jessica's absence? She saw him last.  Sorry Dr Lavon Paganini

## 2023-04-27 NOTE — Telephone Encounter (Signed)
PT is returning call to find out if he can please have an antibiotic called in this morning. He feels very bad. Please advise.

## 2023-04-27 NOTE — Telephone Encounter (Signed)
Patty, this is Dr. Ardell Isaacs patient, he is working today and is in office, I am happy to help if needed.  Thanks

## 2023-04-27 NOTE — Telephone Encounter (Signed)
Augmentin 875 mg po bid with food, #20 Take entire prescription even if feels total well Soft diet until symptoms improve

## 2023-04-27 NOTE — Telephone Encounter (Signed)
Dr Lavon Paganini can you review as DOD   The pt has left lower abd pain and pressure in the rectum that radiates upward.  No bleeding and some loose bowels.  Afebrile. The symptoms began yesterday and have worsened this morning.  He was treated for diverticulitis  on several occasions in the recent past. He has been on several abx for diverticulitis and sinus infection. He also had COVID all within the past month.  Please advise  Dr Lavon Paganini can you review as DOD

## 2023-04-27 NOTE — Telephone Encounter (Signed)
Inbound call from patient stating that he believes that he is having another Diverticulitis flare up. Patient is wanting to know if he can get another antibiotic. Please advise.

## 2023-04-28 ENCOUNTER — Other Ambulatory Visit: Payer: Self-pay | Admitting: Cardiology

## 2023-04-29 NOTE — Telephone Encounter (Signed)
Pt's pharmacy requesting a refill on fluticasone (nasal spray). Would Dr. Rosemary Holms like to refill this non cardiac medication? Please address

## 2023-05-02 ENCOUNTER — Other Ambulatory Visit: Payer: Self-pay | Admitting: Cardiology

## 2023-05-06 NOTE — Telephone Encounter (Signed)
He has taken a few courses of antibiotics for what seems to be mild diverticulitis by CT AP on 9/17.  Trial of dicyclomine 10 mg po tid taken 30-60 minutes AC Contact us if symptoms not improved

## 2023-05-06 NOTE — Telephone Encounter (Signed)
Left message on machine to call back  

## 2023-05-06 NOTE — Telephone Encounter (Signed)
The pt states that he finished the augmentin 875 mg twice daily for 10 days today and states he felt better for a day or two after starting the abx but the pain has not resolved.  Afebrile with no bleeding but he does have some rectal pressure and middle to left lower abd pain hurts when passing gas or has a BM.  He has very soft stools about 2-3 times daily. He feels like  he needs to have a BM all the time.  He is not able to sleep very well. He is not sure where to go from here.  Please advise

## 2023-05-06 NOTE — Telephone Encounter (Signed)
Inbound call from patient in regards to previous note. Please advise.

## 2023-05-07 ENCOUNTER — Other Ambulatory Visit: Payer: Self-pay

## 2023-05-07 MED ORDER — DICYCLOMINE HCL 10 MG PO CAPS: 10.0000 mg | ORAL_CAPSULE | Freq: Three times a day (TID) | ORAL | 1 refills | Status: DC

## 2023-05-07 NOTE — Telephone Encounter (Signed)
The pt has been advised of the recommendation to try Bentyl and call back with an update if symptoms do not resolve. Prescription sent

## 2023-05-20 ENCOUNTER — Encounter: Payer: Self-pay | Admitting: Gastroenterology

## 2023-05-27 ENCOUNTER — Encounter: Payer: Self-pay | Admitting: Gastroenterology

## 2023-05-27 ENCOUNTER — Telehealth: Payer: Self-pay | Admitting: Gastroenterology

## 2023-05-27 DIAGNOSIS — K5732 Diverticulitis of large intestine without perforation or abscess without bleeding: Secondary | ICD-10-CM

## 2023-05-27 NOTE — Telephone Encounter (Signed)
Inbound call from patient stating he is still having complication with his diverticulitis and feels medication has not helped much. Patient states he still has pain, nausea, and trouble with bowel movements. Patient is requesting a call to discuss further. Please advise, thank you.

## 2023-05-28 NOTE — Telephone Encounter (Signed)
Left message on machine to call back  

## 2023-05-29 ENCOUNTER — Other Ambulatory Visit: Payer: Self-pay | Admitting: Gastroenterology

## 2023-05-31 MED ORDER — METRONIDAZOLE 500 MG PO TABS: 500.0000 mg | ORAL_TABLET | Freq: Two times a day (BID) | ORAL | 0 refills | Status: AC

## 2023-05-31 MED ORDER — AMOXICILLIN-POT CLAVULANATE 875-125 MG PO TABS: 1.0000 | ORAL_TABLET | Freq: Two times a day (BID) | ORAL | 0 refills | Status: AC

## 2023-05-31 NOTE — Telephone Encounter (Signed)
Lab order has been entered to be completed  Prescriptions have been sent to the pharmacy He will increase dicyclomine to 20 mg TID  He states he will call and set up colon in a few weeks.  The patient has been notified of this information and all questions answered.

## 2023-05-31 NOTE — Telephone Encounter (Signed)
Reviewed 9/17 JZ office note and 9/17 CT AP  CBC, CMP, lipase, tTG, IgA Augmentin 875 mg po bid with food, #14, no refills Flagyl 500 mg po bid, #14, no refills Schedule colonoscopy in 4-6 weeks Increase dicyclomine to 20 mg po tid, 1 year of refills

## 2023-05-31 NOTE — Telephone Encounter (Signed)
The pt has a history of diverticulitis that has been treated several times over the past few months. On 10/18 began to take dicyclomine 10 mg TID and that has helped some but he says he still have a "raw" feeling in the abdomen below the belly button that comes and goes.  He is not sure what triggers it but says he just does not feel well.  He denies rectal bleeding, he endorses bloating and loose stools 2-3 times daily.  Feels as if he gets weak in the "stomach" with exertion and has to sit down.  Afebrile at this time.    Please advise

## 2023-06-03 ENCOUNTER — Other Ambulatory Visit: Payer: Managed Care, Other (non HMO)

## 2023-06-03 DIAGNOSIS — K5732 Diverticulitis of large intestine without perforation or abscess without bleeding: Secondary | ICD-10-CM | POA: Diagnosis not present

## 2023-06-03 LAB — COMPREHENSIVE METABOLIC PANEL
ALT: 54 U/L — ABNORMAL HIGH (ref 0–53)
AST: 47 U/L — ABNORMAL HIGH (ref 0–37)
Albumin: 4.5 g/dL (ref 3.5–5.2)
Alkaline Phosphatase: 52 U/L (ref 39–117)
BUN: 22 mg/dL (ref 6–23)
CO2: 31 meq/L (ref 19–32)
Calcium: 9.4 mg/dL (ref 8.4–10.5)
Chloride: 102 meq/L (ref 96–112)
Creatinine, Ser: 1.48 mg/dL (ref 0.40–1.50)
GFR: 49.65 mL/min — ABNORMAL LOW (ref >60.00)
Glucose, Bld: 135 mg/dL — ABNORMAL HIGH (ref 70–99)
Potassium: 4.2 meq/L (ref 3.5–5.1)
Sodium: 139 meq/L (ref 135–145)
Total Bilirubin: 0.4 mg/dL (ref 0.2–1.2)
Total Protein: 7.2 g/dL (ref 6.0–8.3)

## 2023-06-03 LAB — CBC WITH DIFFERENTIAL/PLATELET
Basophils Absolute: 0.1 10*3/uL (ref 0.0–0.1)
Basophils Relative: 1 % (ref 0.0–3.0)
Eosinophils Absolute: 0.2 10*3/uL (ref 0.0–0.7)
Eosinophils Relative: 3.4 % (ref 0.0–5.0)
HCT: 45 % (ref 39.0–52.0)
Hemoglobin: 14.9 g/dL (ref 13.0–17.0)
Lymphocytes Relative: 29.9 % (ref 12.0–46.0)
Lymphs Abs: 1.7 10*3/uL (ref 0.7–4.0)
MCHC: 33.2 g/dL (ref 30.0–36.0)
MCV: 93 fL (ref 78.0–100.0)
Monocytes Absolute: 0.6 10*3/uL (ref 0.1–1.0)
Monocytes Relative: 9.8 % (ref 3.0–12.0)
Neutro Abs: 3.1 10*3/uL (ref 1.4–7.7)
Neutrophils Relative %: 55.9 % (ref 43.0–77.0)
Platelets: 214 10*3/uL (ref 150.0–400.0)
RBC: 4.83 Mil/uL (ref 4.22–5.81)
RDW: 13.5 % (ref 11.5–15.5)
WBC: 5.6 10*3/uL (ref 4.0–10.5)

## 2023-06-03 LAB — LIPASE: Lipase: 29 U/L (ref 11.0–59.0)

## 2023-06-04 ENCOUNTER — Other Ambulatory Visit: Payer: Self-pay

## 2023-06-04 DIAGNOSIS — R7989 Other specified abnormal findings of blood chemistry: Secondary | ICD-10-CM

## 2023-06-04 LAB — TISSUE TRANSGLUTAMINASE, IGA: (tTG) Ab, IgA: <1 U/mL

## 2023-06-04 LAB — IGA: Immunoglobulin A: 215 mg/dL (ref 70–320)

## 2023-06-23 ENCOUNTER — Other Ambulatory Visit: Payer: Self-pay | Admitting: Cardiology

## 2023-07-29 ENCOUNTER — Telehealth: Payer: Self-pay | Admitting: Gastroenterology

## 2023-07-29 NOTE — Telephone Encounter (Signed)
 Inbound call from patient stating that he believes he is starting to have a Diverticulitis flare. Patient has been scheduled for an appointment on 1/16 at 10:00 with Amy. Patient is wanting to know if he can get an antibiotic. Please advise.

## 2023-07-29 NOTE — Telephone Encounter (Signed)
 Left message on machine to call back

## 2023-07-30 NOTE — Telephone Encounter (Signed)
Patient calling in regards to previous note. Please advise.

## 2023-07-30 NOTE — Telephone Encounter (Signed)
 Spoke with the pt and he has a recent history of diverticulitis and states that on Tuesday he developed same symptoms of LLQ pain, with loose stools. No bleeding, nausea or vomiting. Pressure makes the pain worse.  He has an appt with Amy on 1/16.  He would like an antibiotic now-pain has worsened. He did start taking his wife's amoxicillin /clav 875 mg/125 1 tab twice daily x 2 days and the pain did ease.  Please advise

## 2023-08-02 MED ORDER — AMOXICILLIN-POT CLAVULANATE 875-125 MG PO TABS: 1.0000 | ORAL_TABLET | Freq: Two times a day (BID) | ORAL | 0 refills | Status: DC

## 2023-08-02 NOTE — Addendum Note (Signed)
 Addended by: Loretha Stapler on: 08/02/2023 09:58 AM   Modules accepted: Orders

## 2023-08-02 NOTE — Telephone Encounter (Signed)
 The pt has been advised and the prescription has been sent as ordered

## 2023-08-05 ENCOUNTER — Ambulatory Visit: Payer: Managed Care, Other (non HMO) | Admitting: Physician Assistant

## 2023-08-05 ENCOUNTER — Encounter: Payer: Self-pay | Admitting: Physician Assistant

## 2023-08-05 VITALS — BP 138/64 | HR 90 | Ht 71.0 in | Wt 210.0 lb

## 2023-08-05 DIAGNOSIS — K5792 Diverticulitis of intestine, part unspecified, without perforation or abscess without bleeding: Secondary | ICD-10-CM | POA: Diagnosis not present

## 2023-08-05 DIAGNOSIS — Z860101 Personal history of adenomatous and serrated colon polyps: Secondary | ICD-10-CM

## 2023-08-05 MED ORDER — SUFLAVE 178.7 G PO SOLR: 1.0000 | Freq: Once | ORAL | 0 refills | Status: AC

## 2023-08-05 MED ORDER — AMOXICILLIN-POT CLAVULANATE 875-125 MG PO TABS: 1.0000 | ORAL_TABLET | Freq: Two times a day (BID) | ORAL | 0 refills | Status: AC

## 2023-08-05 MED ORDER — DICYCLOMINE HCL 10 MG PO CAPS: 10.0000 mg | ORAL_CAPSULE | Freq: Four times a day (QID) | ORAL | 8 refills | Status: AC | PRN

## 2023-08-05 MED ORDER — AMOXICILLIN-POT CLAVULANATE 875-125 MG PO TABS: 1.0000 | ORAL_TABLET | Freq: Two times a day (BID) | ORAL | 0 refills | Status: DC

## 2023-08-05 NOTE — Patient Instructions (Signed)
You have been scheduled for a colonoscopy. Please follow written instructions given to you at your visit today.   Please pick up your prep supplies at the pharmacy within the next 1-3 days.  If you use inhalers (even only as needed), please bring them with you on the day of your procedure.  DO NOT TAKE 7 DAYS PRIOR TO TEST- Trulicity (dulaglutide) Ozempic, Wegovy (semaglutide) Mounjaro (tirzepatide) Bydureon Bcise (exanatide extended release)  DO NOT TAKE 1 DAY PRIOR TO YOUR TEST Rybelsus (semaglutide) Adlyxin (lixisenatide) Victoza (liraglutide) Byetta (exanatide)  You will receive your bowel preparation through [facility], which ensures the lowest copay and home delivery, with outreach via text or call from an [phone] number. Please respond promptly to avoid rescheduling. If you are interested in alternative options or have any questions please contact them at [phone]  Your Provider Has Sent Your Bowel Prep Regimen To [facility] What to expect. [facility] will contact you to verify your information and collect your copay, if applicable. Enjoy the comfort of your home while we deliver your prescription to you, free of any shipping charges. Fast, FREE delivery or shipping. [facility] accepts all major insurance benefits and applies discounts & coupons  Have additional questions? [facility]'s patient care team is always here to help.  Chat: [email or web address] Call: [phone] Email: [email or web address] NCPDP: [id number] How will we contact you? Welcome Phone call  a Welcome text and a Checkout link in a text Texts you receive from [phone] Are Not Spam.   *To set up delivery, you must complete the checkout process via link or speak to one of our patient care representatives. If we are unable to reach you, your prescription may be delayed.  ___________________________________________________________________________  Stay on Aspirin.  We have sent medications  to your pharmacy for you to pick up at your convenience. Call us if you have to use the antibiotics.  Follow a High fiber diet plan.  Take metamucil daily.  I appreciate the opportunity to care for you. Amy Esterwood, PA-C

## 2023-08-08 ENCOUNTER — Encounter: Payer: Self-pay | Admitting: Physician Assistant

## 2023-08-08 NOTE — Progress Notes (Signed)
Subjective:    Patient ID: William Mason, male    DOB: [birth year], 63 y.o.   MRN: [id number]  HPI William Mason is a pleasant 63 year old white male previously established with Dr. Russella Dar who was last seen in the office in September 2024 by Doug Sou, PA-C after an episode of prolonged diverticulitis.  Patient says that he required several courses of antibiotics in July to August of 2024 and was finally able to clear that episode.  Prior to that it had been about 12 years since he had had an episode of diverticulitis. His last colonoscopy was done in November 2019 for history of adenomatous colon polyps.  He had a 7 mm polyp removed from the descending colon was noted to have a few medium sized diverticuli in the sigmoid and grade 1 internal hemorrhoids. Path showed the polyp to be a tubular adenoma and was indicated for 5-year interval follow-up. Patient had called on 07/30/2023 with recurrent symptoms.   He expressed frustration today because he did not get a call back from the nurse that same day after requesting an antibiotic.  Fortunately his wife had some Augmentin at home and he started on that over the weekend until he was called in an antibiotic.  He was given 5 more days of Augmentin to complete a 7-day course. At this point his pain is gone and he is feeling much better.  He says his bowel movements are regular, he has not noticed any melena or hematochezia.  He did not have any fever with this episode.  Interestingly this episode occurred while he was ill with influenza and one of his last episodes in the summer occurred after he had just had COVID. His other medical problems include coronary artery disease status post 2 prior stents 2018, currently on baby aspirin daily Hypertension, Sleep apnea with CPAP use no oxygen, History of left carotid stenosis, 2 prior shoulder repairs in 2 previous hernia repairs. He had cardiac cath in 2021 at that time EF 60 to 65% and the left circumflex stents  were patent.  Review of Systems Pertinent positive and negative review of systems were noted in the above HPI section.  All other review of systems was otherwise negative.   Outpatient Encounter Medications as of 08/05/2023  Medication Sig   aspirin EC 81 MG tablet Take 1 tablet (81 mg total) by mouth daily. Swallow whole.   Cholecalciferol (VITAMIN D3) 50 MCG (2000 UT) TABS Take 1 tablet by mouth daily.   diazepam (VALIUM) 5 MG tablet Take 5 mg by mouth every 6 (six) hours as needed (lower back spasms).   ezetimibe (ZETIA) 10 MG tablet Take 1 tablet (10 mg total) by mouth daily.   fluticasone (FLONASE) 50 MCG/ACT nasal spray USE 2 SPRAYS INTO EACH NOSTRIL ONCE DAILY   magnesium gluconate (MAGONATE) 500 MG tablet Take 500 mg by mouth daily.   metoprolol succinate (TOPROL-XL) 50 MG 24 hr tablet Take 1 tablet (50 mg total) by mouth daily.   oxaprozin (DAYPRO) 600 MG tablet Take 1 tablet (600 mg total) by mouth daily.   oxyCODONE-acetaminophen (PERCOCET) 5-325 MG tablet Take 1 tablet by mouth every 4 (four) hours as needed (max 6 q).   Polyethyl Glycol-Propyl Glycol 0.4-0.3 % SOLN Place 1-2 drops into both eyes 3 (three) times daily as needed (eye irritation).    pyridOXINE (VITAMIN B6) 100 MG tablet Take 100 mg by mouth daily.   RABEprazole (ACIPHEX) 20 MG tablet TAKE 1 TABLET BY MOUTH EVERY DAY  rosuvastatin (CRESTOR) 20 MG tablet Take 1 tablet (20 mg total) by mouth daily.   [EXPIRED] SUFLAVE 178.7 g SOLR Take 1 kit by mouth once for 1 dose.   [DISCONTINUED] amoxicillin-clavulanate (AUGMENTIN) 875-125 MG tablet Take 1 tablet by mouth 2 (two) times daily for 5 days.   [DISCONTINUED] dicyclomine (BENTYL) 10 MG capsule TAKE 1 CAPSULE (10 MG TOTAL) BY MOUTH 3 (THREE) TIMES DAILY BEFORE MEALS.   amoxicillin-clavulanate (AUGMENTIN) 875-125 MG tablet Take 1 tablet by mouth 2 (two) times daily for 10 days. Call if you have to use this   dicyclomine (BENTYL) 10 MG capsule Take 1 capsule (10 mg total)  by mouth every 6 (six) hours as needed (abdominal pain, cramping).   nitroGLYCERIN (NITROSTAT) 0.4 MG SL tablet Place 1 tablet (0.4 mg total) under the tongue every 5 (five) minutes as needed for chest pain.   [DISCONTINUED] amoxicillin-clavulanate (AUGMENTIN) 875-125 MG tablet Take 1 tablet by mouth 2 (two) times daily for 5 days. Call if you have to use this   [DISCONTINUED] cetirizine (ZYRTEC) 10 MG tablet Take 10 mg by mouth daily.   [DISCONTINUED] ciprofloxacin (CIPRO) 500 MG tablet Take 500 mg by mouth 2 (two) times daily.   [DISCONTINUED] hyoscyamine (LEVSIN/SL) 0.125 MG SL tablet Place 1 tablet (0.125 mg total) under the tongue every 6 (six) hours as needed.   [DISCONTINUED] oxyCODONE-acetaminophen (PERCOCET) 5-325 MG tablet Take 1 tablet by mouth every 4 (four) hours as needed for severe pain.   No facility-administered encounter medications on file as of 08/05/2023.   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, mild 08/17/2018   Hx of colonic polyps 06/07/2018   Antiplatelet or antithrombotic long-term use 06/07/2018   OSA on CPAP 02/10/2018   Coronary artery disease involving native coronary artery of native heart without angina pectoris 02/10/2018   Excessive daytime sleepiness 02/10/2018   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) 04/27/2017   Abnormal stress test 04/25/2017   Social History    Socioeconomic History   Marital status: Married    Spouse name: Not on file   Number of children: 0   Years of education: Not on file   Highest education level: Not on file  Occupational History   Not on file  Tobacco Use   Smoking status: Never   Smokeless tobacco: Former    Types: Chew    Quit date: 1980  Vaping Use   Vaping status: Never Used  Substance and Sexual Activity   Alcohol use: Not Currently    Comment: 04/27/2017 "1-2 drinks/year; if that"   Drug use: No   Sexual activity: Not Currently  Other Topics Concern   Not on file  Social History Narrative   Not on file   Social Drivers of Health   Financial Resource Strain: Not on file  Food Insecurity: Not on file  Transportation Needs: Not on file  Physical Activity: Not on file  Stress:  Not on file  Social Connections: Not on file  Intimate Partner Violence: Not on file    Mr. Roop's family history includes Breast cancer in his maternal grandmother; COPD in his father; Heart disease in his father and mother; Hypertension in his mother; Leukemia in his paternal grandfather.      Objective:    Vitals:   08/05/23 1013  BP: 138/64  Pulse: 90    Physical Exam Well-developed well-nourished older white male in no acute distress.  Height, Weight, 210 BMI 29.2  HEENT; nontraumatic normocephalic, EOMI, PE R LA, sclera anicteric. Oropharynx; not examined today Neck; supple, no JVD Cardiovascular; regular rate and rhythm with S1-S2, no murmur rub or gallop Pulmonary; Clear bilaterally Abdomen; soft, very minimally tender with deep palpation left lower quadrant no guarding no rebound nondistended, no palpable mass or hepatosplenomegaly, bowel sounds are active Rectal; not done today Skin; benign exam, no jaundice rash or appreciable lesions Extremities; no clubbing cyanosis or edema skin warm and dry Neuro/Psych; alert and oriented x4, grossly nonfocal mood and affect appropriate        Assessment &  Plan:   #58 63 year old white male with history of recurrent diverticulitis who comes in today after a very recent episode, just completed 7 days of Augmentin. Symptoms have resolved and he is feeling well  Patient had a more prolonged episode of diverticulitis July and August 2024 required a few courses of antibiotics. 1 episode about 12 years ago.  Frustrated today as he was unable to get antibiotics for a few days after he had called here a little over a week ago..  #2 history of adenomatous colon polyps-last colonoscopy November 2019, due for follow-up colonoscopy  #3 coronary artery disease status post previous stents, currently just on aspirin therapy Last cath 2021 with patent left circumflex stent send EF 60 to 65% #4.  Sleep apnea with CPAP use no oxygen use #5.  Hypertension #6.  Left carotid stenosis #7.  Status post 2 prior left shoulder replacements #8.  Status post hernia repair x 2  Plan; Will send a prescription for Augmentin 875 1 p.o. twice daily x 10 days to patient's pharmacy, so he can have antibiotic available should he have a recurrent episode.  I advised he should still call and let us know if he is having another episode of diverticulitis so we can decide if he needs any further workup let labs imaging etc. We discussed general management of diverticulosis with high-fiber diet, continue Metamucil on a daily basis in 8 ounces of water or juice Refill Bentyl 10 mg every 6 hours to take on a as needed basis.  He is currently taking this regularly and will gradually wean down to as needed. Patient will be established with Dr. Tomasa Rand and will schedule patient for colonoscopy with Dr. Tomasa Rand.  Procedure was discussed in detail with the patient including indications risks and benefits and he is agreeable to proceed. I am happy to see him  in the future.    Shyenne Maggard S Vinia Jemmott PA-C 08/08/2023   Cc: No ref. provider found

## 2023-08-13 NOTE — Progress Notes (Signed)
Agree with the assessment and plan as outlined by Mike Gip, PA-C.  Colonoscopy should be scheduled at least 6 weeks out from resolution of diverticulitis symptoms to minimize risk of perforation  Myley Bahner E. Tomasa Rand, MD

## 2023-09-15 ENCOUNTER — Encounter: Payer: Self-pay | Admitting: Gastroenterology

## 2023-09-22 ENCOUNTER — Telehealth: Payer: Self-pay | Admitting: Physician Assistant

## 2023-09-22 DIAGNOSIS — K5792 Diverticulitis of intestine, part unspecified, without perforation or abscess without bleeding: Secondary | ICD-10-CM

## 2023-09-22 DIAGNOSIS — Z860101 Personal history of adenomatous and serrated colon polyps: Secondary | ICD-10-CM

## 2023-09-22 NOTE — Telephone Encounter (Signed)
 I did new instructions and called him to confirm address to mail them to. He can also see them in his [facility]. He has his prep kit already. He is turning 63 on 10/16/2023 and he is unsure if his insurance will change to Medicare. I told him to call us as soon as he finds out. I have placed him on a wait list per his request.

## 2023-09-22 NOTE — Telephone Encounter (Signed)
 Patient rescheduled for procedure due to family emergency and no care partner. Please issues updated prep instructions.   Thank you

## 2023-09-24 ENCOUNTER — Encounter: Payer: Managed Care, Other (non HMO) | Admitting: Gastroenterology

## 2023-10-13 ENCOUNTER — Encounter: Payer: Self-pay | Admitting: Gastroenterology

## 2023-10-21 ENCOUNTER — Encounter: Payer: Self-pay | Admitting: Gastroenterology

## 2023-10-21 ENCOUNTER — Ambulatory Visit: Admitting: Gastroenterology

## 2023-10-21 VITALS — BP 130/64 | HR 64 | Temp 97.5°F | Resp 13 | Ht 71.0 in | Wt 210.0 lb

## 2023-10-21 DIAGNOSIS — K5730 Diverticulosis of large intestine without perforation or abscess without bleeding: Secondary | ICD-10-CM

## 2023-10-21 DIAGNOSIS — Z860100 Personal history of colon polyps, unspecified: Secondary | ICD-10-CM

## 2023-10-21 DIAGNOSIS — D123 Benign neoplasm of transverse colon: Secondary | ICD-10-CM

## 2023-10-21 DIAGNOSIS — Z860101 Personal history of adenomatous and serrated colon polyps: Secondary | ICD-10-CM | POA: Diagnosis not present

## 2023-10-21 DIAGNOSIS — K5732 Diverticulitis of large intestine without perforation or abscess without bleeding: Secondary | ICD-10-CM

## 2023-10-21 DIAGNOSIS — D122 Benign neoplasm of ascending colon: Secondary | ICD-10-CM | POA: Diagnosis not present

## 2023-10-21 DIAGNOSIS — Z1211 Encounter for screening for malignant neoplasm of colon: Secondary | ICD-10-CM

## 2023-10-21 DIAGNOSIS — K635 Polyp of colon: Secondary | ICD-10-CM | POA: Diagnosis not present

## 2023-10-21 MED ORDER — SODIUM CHLORIDE 0.9 % IV SOLN: 500.0000 mL | INTRAVENOUS | Status: DC

## 2023-10-21 NOTE — Op Note (Signed)
 [facility] Patient Name: William Mason Procedure Date: 10/21/2023 2:44 PM MRN: [id number] Endoscopist: Lorin Picket E. Tomasa Rand , MD, [id number] Age: 63 Referring MD:  Date of Birth: 01/20/1961 Gender: Male Account #: [id number] Procedure:                Colonoscopy Indications:              Surveillance: Personal history of adenomatous                            polyps on last colonoscopy > 5 years ago Medicines:                Monitored Anesthesia Care Procedure:                Pre-Anesthesia Assessment:                           - Prior to the procedure, a History and Physical                            was performed, and patient medications and                            allergies were reviewed. The patient's tolerance of                            previous anesthesia was also reviewed. The risks                            and benefits of the procedure and the sedation                            options and risks were discussed with the patient.                            All questions were answered, and informed consent                            was obtained. Prior Anticoagulants: The patient has                            taken no anticoagulant or antiplatelet agents. ASA                            Grade Assessment: III - A patient with severe                            systemic disease. After reviewing the risks and                            benefits, the patient was deemed in satisfactory                            condition to undergo the procedure.  After obtaining informed consent, the colonoscope                            was passed under direct vision. Throughout the                            procedure, the patient's blood pressure, pulse, and                            oxygen saturations were monitored continuously. The                            Olympus Scope SN: [id number] was introduced through                            the anus and  advanced to the the terminal ileum,                            with identification of the appendiceal orifice and                            IC valve. The colonoscopy was performed without                            difficulty. The patient tolerated the procedure                            well. The quality of the bowel preparation was                            adequate. The terminal ileum, ileocecal valve,                            appendiceal orifice, and rectum were photographed.                            The bowel preparation used was SUFLAVE via split                            dose instruction. Scope In: 2:57:30 PM Scope Out: 3:15:52 PM Scope Withdrawal Time: 0 hours 13 minutes 8 seconds  Total Procedure Duration: 0 hours 18 minutes 22 seconds  Findings:                 The perianal and digital rectal examinations were                            normal. Pertinent negatives include normal                            sphincter tone and no palpable rectal lesions.                           A 2 mm polyp was found in the ascending colon. The  polyp was sessile. The polyp was removed with a                            cold snare. Resection and retrieval were complete.                            Estimated blood loss was minimal.                           Two sessile polyps were found in the hepatic                            flexure. The polyps were 2 to 3 mm in size. These                            polyps were removed with a cold snare. Resection                            and retrieval were complete. Estimated blood loss                            was minimal.                           Multiple medium-mouthed and small-mouthed                            diverticula were found in the sigmoid colon and                            descending colon.                           The exam was otherwise normal throughout the                            examined colon.                            The terminal ileum appeared normal.                           The retroflexed view of the distal rectum and anal                            verge was normal and showed no anal or rectal                            abnormalities. Complications:            No immediate complications. Estimated Blood Loss:     Estimated blood loss was minimal. Impression:               - One 2 mm polyp in the ascending colon, removed  with a cold snare. Resected and retrieved.                           - Two 2 to 3 mm polyps at the hepatic flexure,                            removed with a cold snare. Resected and retrieved.                           - Moderate diverticulosis in the sigmoid colon and                            in the descending colon.                           - The examined portion of the ileum was normal.                           - The distal rectum and anal verge are normal on                            retroflexion view. Recommendation:           - Patient has a contact number available for                            emergencies. The signs and symptoms of potential                            delayed complications were discussed with the                            patient. Return to normal activities tomorrow.                            Written discharge instructions were provided to the                            patient.                           - Resume previous diet.                           - Continue present medications.                           - Await pathology results.                           - Repeat colonoscopy (date not yet determined) for                            surveillance based on pathology results.                           -  Recommend high fiber diet/daily fiber supplement                            to reduce risk of recurrent diverticulitis. [place] E. Tomasa Rand, MD 10/21/2023 3:21:02 PM This report has been  signed electronically.

## 2023-10-21 NOTE — Progress Notes (Signed)
 [facility] History and Physical   Primary Care Physician:  Patient, No Pcp Per   Reason for Procedure:   History of colon polyps  Plan:    Surveillance colonoscopy     HPI: William Mason is a 63 y.o. male undergoing surveillance colonoscopy.  His last colonoscopy was in 2019 and a single small tubular adenoma was removed.  He has a history of recurrent diverticulitis, with his last episode in January of this year.   Past Medical History:  Diagnosis Date   Allergy    Anxiety    Arthritis    "knees, shoulders, back" (04/27/2017)   Carotid artery stenosis    Cataract    Chronic back pain    "5 ruptured discs in the middle; 1 ruptured disc in the lower" (04/27/2017)   Coronary artery disease    2 cardiac stents  oct 2018   Depression    Diverticulitis    Exertional heat stroke ~ 01/2017   Family history of adverse reaction to anesthesia    "mom had emergency appy at age 17; she had severe short term memory loss; never recovered"   Fatty liver disease, nonalcoholic    GERD (gastroesophageal reflux disease)    Glaucoma    laser treated   narrow angle   H/O degenerative disc disease    History of hiatal hernia    HLD (hyperlipidemia) 02/01/2019   Hyperlipidemia    Hypertension    OSA on CPAP    Pneumonia    PVC (premature ventricular contraction)    PVC (premature ventricular contraction)    Sleep apnea     Past Surgical History:  Procedure Laterality Date   COLONOSCOPY     have had 5 or more   CORONARY ANGIOPLASTY WITH STENT PLACEMENT  04/27/2017   x2    CORONARY STENT INTERVENTION N/A 04/27/2017   Procedure: CORONARY STENT INTERVENTION;  Surgeon: Elder Negus, MD;  Location: [facility];  Service: Cardiovascular;  Laterality: N/A;   EYE SURGERY Left    laser   INGUINAL HERNIA REPAIR Left 07/03/2021   Procedure: OPEN LEFT INGUINAL HERNIA REPAIR WITH MESH;  Surgeon: Manus Rudd, MD;  Location: [facility];  Service:  General;  Laterality: Left;   INGUINAL HERNIA REPAIR Left 12/30/2022   Procedure: LAPAROSCOPIC RECURRENT LEFT INGUINAL HERNIA REPAIR WITH MESH;  Surgeon: Quentin Ore, MD;  Location: [facility];  Service: General;  Laterality: Left;   LEFT HEART CATH AND CORONARY ANGIOGRAPHY N/A 04/27/2017   Procedure: LEFT HEART CATH AND CORONARY ANGIOGRAPHY;  Surgeon: Elder Negus, MD;  Location: [facility];  Service: Cardiovascular;  Laterality: N/A;   LEFT HEART CATH AND CORONARY ANGIOGRAPHY N/A 08/29/2019   Procedure: LEFT HEART CATH AND CORONARY ANGIOGRAPHY;  Surgeon: Elder Negus, MD;  Location: [facility];  Service: Cardiovascular;  Laterality: N/A;   REVISION TOTAL SHOULDER TO REVERSE TOTAL SHOULDER Left 05/09/2020   Procedure: LEFT SHOULDER CONVERSION OF TOTAL SHOULDER TO REVERSE ARTHROPLASTY;  Surgeon: Yolonda Kida, MD;  Location: [facility];  Service: Orthopedics;  Laterality: Left;   SHOULDER SURGERY Left ~ 1992   "tendon relocation; cut me open to do it"   SHOULDER SURGERY Right 08/04/2018   SHOULDER SURGERY Left 2021   SURGERY SCROTAL / TESTICULAR Left ~ 1980   "cyst removed"   TONSILLECTOMY     TOTAL SHOULDER ARTHROPLASTY Left 05/18/2019   Procedure: Left Shoulder hardware removal with Anatomic shoulder arthroplasty;  Surgeon:  Francena Hanly, MD;  Location: [facility];  Service: Orthopedics;  Laterality: Left;     Prior to Admission medications   Medication Sig Start Date End Date Taking? Authorizing Provider  aspirin EC 81 MG tablet Take 1 tablet (81 mg total) by mouth daily. Swallow whole. 03/05/23  Yes Patwardhan, Manish J, MD  Cholecalciferol (VITAMIN D3) 50 MCG (2000 UT) TABS Take 1 tablet by mouth daily.   Yes [provider]  diazepam (VALIUM) 5 MG tablet Take 5 mg by mouth every 6 (six) hours as needed (lower back spasms).   Yes [provider]  ezetimibe (ZETIA) 10 MG tablet Take 1 tablet (10 mg total) by mouth daily. 03/05/23   Yes Patwardhan, Manish J, MD  fluticasone (FLONASE) 50 MCG/ACT nasal spray USE 2 SPRAYS INTO EACH NOSTRIL ONCE DAILY 06/25/23  Yes Patwardhan, Manish J, MD  magnesium gluconate (MAGONATE) 500 MG tablet Take 500 mg by mouth daily.   Yes [provider]  metoprolol succinate (TOPROL-XL) 50 MG 24 hr tablet Take 1 tablet (50 mg total) by mouth daily. 03/05/23  Yes Patwardhan, Manish J, MD  oxaprozin (DAYPRO) 600 MG tablet Take 1 tablet (600 mg total) by mouth daily. 02/05/22  Yes Patwardhan, Manish J, MD  oxyCODONE-acetaminophen (PERCOCET) 5-325 MG tablet Take 1 tablet by mouth every 4 (four) hours as needed (max 6 q). 05/19/19  Yes Shuford, French Ana, PA-C  Polyethyl Glycol-Propyl Glycol 0.4-0.3 % SOLN Place 1-2 drops into both eyes 3 (three) times daily as needed (eye irritation).    Yes [provider]  pyridOXINE (VITAMIN B6) 100 MG tablet Take 100 mg by mouth daily.   Yes [provider]  RABEprazole (ACIPHEX) 20 MG tablet TAKE 1 TABLET BY MOUTH EVERY DAY 05/04/23  Yes Patwardhan, Manish J, MD  rosuvastatin (CRESTOR) 20 MG tablet Take 1 tablet (20 mg total) by mouth daily. 03/05/23  Yes Patwardhan, Anabel Bene, MD  dicyclomine (BENTYL) 10 MG capsule Take 1 capsule (10 mg total) by mouth every 6 (six) hours as needed (abdominal pain, cramping). 08/05/23   Esterwood, Amy S, PA-C  nitroGLYCERIN (NITROSTAT) 0.4 MG SL tablet Place 1 tablet (0.4 mg total) under the tongue every 5 (five) minutes as needed for chest pain. 03/05/23 06/03/23  Elder Negus, MD    Current Outpatient Medications  Medication Sig Dispense Refill   aspirin EC 81 MG tablet Take 1 tablet (81 mg total) by mouth daily. Swallow whole. 90 tablet 3   Cholecalciferol (VITAMIN D3) 50 MCG (2000 UT) TABS Take 1 tablet by mouth daily.     diazepam (VALIUM) 5 MG tablet Take 5 mg by mouth every 6 (six) hours as needed (lower back spasms).     ezetimibe (ZETIA) 10 MG tablet Take 1 tablet (10 mg total) by mouth daily. 90  tablet 3   fluticasone (FLONASE) 50 MCG/ACT nasal spray USE 2 SPRAYS INTO EACH NOSTRIL ONCE DAILY 48 mL 3   magnesium gluconate (MAGONATE) 500 MG tablet Take 500 mg by mouth daily.     metoprolol succinate (TOPROL-XL) 50 MG 24 hr tablet Take 1 tablet (50 mg total) by mouth daily. 90 tablet 3   oxaprozin (DAYPRO) 600 MG tablet Take 1 tablet (600 mg total) by mouth daily. 90 tablet 1   oxyCODONE-acetaminophen (PERCOCET) 5-325 MG tablet Take 1 tablet by mouth every 4 (four) hours as needed (max 6 q). 30 tablet 0   Polyethyl Glycol-Propyl Glycol 0.4-0.3 % SOLN Place 1-2 drops into both eyes 3 (  three) times daily as needed (eye irritation).      pyridOXINE (VITAMIN B6) 100 MG tablet Take 100 mg by mouth daily.     RABEprazole (ACIPHEX) 20 MG tablet TAKE 1 TABLET BY MOUTH EVERY DAY 90 tablet 1   rosuvastatin (CRESTOR) 20 MG tablet Take 1 tablet (20 mg total) by mouth daily. 90 tablet 3   dicyclomine (BENTYL) 10 MG capsule Take 1 capsule (10 mg total) by mouth every 6 (six) hours as needed (abdominal pain, cramping). 90 capsule 8   nitroGLYCERIN (NITROSTAT) 0.4 MG SL tablet Place 1 tablet (0.4 mg total) under the tongue every 5 (five) minutes as needed for chest pain. 25 tablet 2   Current Facility-Administered Medications  Medication Dose Route Frequency Provider Last Rate Last Admin   0.9 %  sodium chloride infusion  500 mL Intravenous Continuous Jenel Lucks, MD        Allergies as of 10/21/2023 - Review Complete 10/21/2023  Allergen Reaction Noted   Hydrocodone Nausea Only 12/15/2022   Imdur [isosorbide nitrate] Other (See Comments) 05/06/2020    Family History  Problem Relation Age of Onset   Heart disease Mother    Hypertension Mother    Colon polyps Father    COPD Father    Heart disease Father    Breast cancer Maternal Grandmother    Leukemia Paternal Grandfather    Colon cancer Neg Hx    Esophageal cancer Neg Hx    Rectal cancer Neg Hx    Stomach cancer Neg Hx      Social History   Socioeconomic History   Marital status: Married    Spouse name: Not on file   Number of children: 0   Years of education: Not on file   Highest education level: Not on file  Occupational History   Not on file  Tobacco Use   Smoking status: Never   Smokeless tobacco: Former    Types: Chew    Quit date: 1980  Vaping Use   Vaping status: Never Used  Substance and Sexual Activity   Alcohol use: Not Currently    Comment: 04/27/2017 "1-2 drinks/year; if that"   Drug use: No   Sexual activity: Not Currently  Other Topics Concern   Not on file  Social History Narrative   Not on file   Social Drivers of Health   Financial Resource Strain: Not on file  Food Insecurity: Not on file  Transportation Needs: Not on file  Physical Activity: Not on file  Stress: Not on file  Social Connections: Not on file  Intimate Partner Violence: Not on file    Review of Systems:  All other review of systems negative except as mentioned in the HPI.  Physical Exam: Vital signs BP (!) 140/70   Pulse 66   Temp (!) 97.5 F (36.4 C) (Skin)   Ht 5\' 11"  (1.803 m)   Wt 210 lb (95.3 kg)   SpO2 98%   BMI 29.29 kg/m   General:   Alert,  Well-developed, well-nourished, pleasant and cooperative in NAD Airway:  Mallampati 2 Lungs:  Clear throughout to auscultation.   Heart:  Regular rate and rhythm; no murmurs, clicks, rubs,  or gallops. Abdomen:  Soft, nontender and nondistended. Normal bowel sounds.   Neuro/Psych:  Normal mood and affect. A and O x 3   Soma Lizak E. Tomasa Rand, MD [facility]

## 2023-10-21 NOTE — Progress Notes (Signed)
 Called to room to assist during endoscopic procedure.  Patient ID and intended procedure confirmed with present staff. Received instructions for my participation in the procedure from the performing physician.

## 2023-10-21 NOTE — Patient Instructions (Addendum)
 Resume previous diet Continue present medications Await pathology results  Handouts/information given for polyps, diverticulosis and high fiber diet  YOU HAD AN ENDOSCOPIC PROCEDURE TODAY AT THE [facility]:   Refer to the procedure report that was given to you for any specific questions about what was found during the examination.  If the procedure report does not answer your questions, please call your gastroenterologist to clarify.  If you requested that your care partner not be given the details of your procedure findings, then the procedure report has been included in a sealed envelope for you to review at your convenience later.  YOU SHOULD EXPECT: Some feelings of bloating in the abdomen. Passage of more gas than usual.  Walking can help get rid of the air that was put into your GI tract during the procedure and reduce the bloating. If you had a lower endoscopy (such as a colonoscopy or flexible sigmoidoscopy) you may notice spotting of blood in your stool or on the toilet paper. If you underwent a bowel prep for your procedure, you may not have a normal bowel movement for a few days.  Please Note:  You might notice some irritation and congestion in your nose or some drainage.  This is from the oxygen used during your procedure.  There is no need for concern and it should clear up in a day or so.  SYMPTOMS TO REPORT IMMEDIATELY:  Following lower endoscopy (colonoscopy):  Excessive amounts of blood in the stool  Significant tenderness or worsening of abdominal pains  Swelling of the abdomen that is new, acute  Fever of 100F or higher For urgent or emergent issues, a gastroenterologist can be reached at any hour by calling [phone]. Do not use MyChart messaging for urgent concerns.   DIET:  We do recommend a small meal at first, but then you may proceed to your regular diet.  Drink plenty of fluids but you should avoid alcoholic beverages for 24 hours.  ACTIVITY:  You  should plan to take it easy for the rest of today and you should NOT DRIVE or use heavy machinery until tomorrow (because of the sedation medicines used during the test).    FOLLOW UP: Our staff will call the number listed on your records the next business day following your procedure.  We will call around 7:15- 8:00 am to check on you and address any questions or concerns that you may have regarding the information given to you following your procedure. If we do not reach you, we will leave a message.     If any biopsies were taken you will be contacted by phone or by letter within the next 1-3 weeks.  Please call us at [phone] if you have not heard about the biopsies in 3 weeks.   SIGNATURES/CONFIDENTIALITY: You and/or your care partner have signed paperwork which will be entered into your electronic medical record.  These signatures attest to the fact that that the information above on your After Visit Summary has been reviewed and is understood.  Full responsibility of the confidentiality of this discharge information lies with you and/or your care-partner.

## 2023-10-21 NOTE — Progress Notes (Signed)
 Vss nad trans to pacu

## 2023-10-22 ENCOUNTER — Telehealth: Payer: Self-pay

## 2023-10-22 NOTE — Telephone Encounter (Signed)
 Left message on answering machine.

## 2023-10-26 LAB — SURGICAL PATHOLOGY

## 2023-10-28 ENCOUNTER — Encounter: Payer: Self-pay | Admitting: Gastroenterology

## 2023-10-28 NOTE — Progress Notes (Signed)
 William Mason,   The three small polyps that I removed during your recent procedure were completely benign but were proven to be "pre-cancerous" polyps that MAY have grown into cancers if they had not been removed.  Studies shows that at least 20% of women over age 65 and 30% of men over age 63 have pre-cancerous polyps.  Based on current nationally recognized surveillance guidelines, I recommend that you have a repeat colonoscopy in 5 years.   If you develop any new rectal bleeding, abdominal pain or significant bowel habit changes, please contact me before then.

## 2023-11-05 ENCOUNTER — Other Ambulatory Visit: Payer: Self-pay | Admitting: Cardiology

## 2023-11-05 NOTE — Telephone Encounter (Signed)
 Pt pharmacy is requesting a refill on non cardiac medication Rabeprazole  and Duloxetine  20 mg. Would Dr. Filiberto Hug like to refill these medications? Please address

## 2023-11-17 ENCOUNTER — Other Ambulatory Visit: Payer: Self-pay | Admitting: Cardiology

## 2024-01-13 ENCOUNTER — Other Ambulatory Visit: Payer: Self-pay | Admitting: Cardiology

## 2024-02-14 ENCOUNTER — Other Ambulatory Visit: Payer: Self-pay | Admitting: Cardiology

## 2024-02-22 ENCOUNTER — Ambulatory Visit (HOSPITAL_COMMUNITY)
Admission: RE | Admit: 2024-02-22 | Discharge: 2024-02-22 | Disposition: A | Discharge status: Home / Self Care | Source: Ambulatory Visit | Attending: Cardiology | Admitting: Cardiology

## 2024-02-22 DIAGNOSIS — I6522 Occlusion and stenosis of left carotid artery: Secondary | ICD-10-CM | POA: Diagnosis present

## 2024-02-23 ENCOUNTER — Other Ambulatory Visit: Payer: Managed Care, Other (non HMO)

## 2024-02-23 ENCOUNTER — Ambulatory Visit: Payer: Self-pay | Admitting: Cardiology

## 2024-02-23 ENCOUNTER — Ambulatory Visit (HOSPITAL_COMMUNITY): Payer: Managed Care, Other (non HMO)

## 2024-02-23 NOTE — Progress Notes (Signed)
 Stable mild to moderate carotid artery disease.  I have not seen him in a year.  Recommend nonurgent follow-up.  I will discuss future carotid ultrasound order at that visit.  Thanks MJP

## 2024-03-08 ENCOUNTER — Ambulatory Visit: Payer: Self-pay | Admitting: Cardiology

## 2024-03-14 ENCOUNTER — Telehealth: Payer: Self-pay | Admitting: Cardiology

## 2024-03-14 DIAGNOSIS — I25118 Atherosclerotic heart disease of native coronary artery with other forms of angina pectoris: Secondary | ICD-10-CM

## 2024-03-14 DIAGNOSIS — E782 Mixed hyperlipidemia: Secondary | ICD-10-CM

## 2024-03-14 DIAGNOSIS — Z125 Encounter for screening for malignant neoplasm of prostate: Secondary | ICD-10-CM

## 2024-03-14 NOTE — Telephone Encounter (Signed)
 Pt c/o BP issue: STAT if pt c/o blurred vision, one-sided weakness or slurred speech.  STAT if BP is GREATER than 180/120 TODAY.  STAT if BP is LESS than 90/60 and SYMPTOMATIC TODAY  1. What is your BP concern? Pt's BP was higher than baseline in an office visit he had this morning, as well as during his carotid check.  2. Have you taken any BP medication today? Yes; in the morning, before getting BP checked in the office  3. What are your last 5 BP readings? 147/70 during OV; 150/- during carotid  4. Are you having any other symptoms (ex. Dizziness, headache, blurred vision, passed out)? No  Pt is also requesting to get full blood panel done before appt on 8/29; pt has not been able to get full blood panel through PCP done in over 6 months due to recurring illnesses. Pt does not have a current PCP as his previous PCP passed away earlier this year.

## 2024-03-14 NOTE — Telephone Encounter (Signed)
 Okay to check all of the above. Lipid panel (CAD, mixed hyperlipidemia) CMP(CAD) PSA (Screening for prostate cancer)  Thanks MJP

## 2024-03-14 NOTE — Telephone Encounter (Signed)
 Orders placed today and pt also advised to make sure he stays hydrated and monitors blood pressures. Pt agrees with plan of care.

## 2024-03-14 NOTE — Telephone Encounter (Signed)
 Patient states he was concerned about his recent elevated BP readings.  8/26 at office visit: 147/70 8/5 at carotid US  appt--150/? (Was not sure of diastolic reading)  He does not check his BP at home and has no other readings to share.   Patient states he has no energy and feels run down. He had a UTI 2-3 weeks ago for which he took a round of antibiotics. He reports he was told his blood sugar was high at urgent care visit recently.  Patient is scheduled to see Dr. Elmira on 8/29. Patient would like to know if he needs to have labs drawn prior to his visit or if he should come to appt fasting for updated labs after office visit.  Patient states his PCP passed away earlier this year and he has not established with new provider yet. He asked if Dr. Elmira could check: Lipids, kidney and liver function, and PSA.  Will forward to Dr. Elmira to review and advise.

## 2024-03-16 LAB — COMPREHENSIVE METABOLIC PANEL WITH GFR
ALT: 46 IU/L — ABNORMAL HIGH (ref 0–44)
AST: 46 IU/L — ABNORMAL HIGH (ref 0–40)
Albumin: 4.2 g/dL (ref 3.9–4.9)
Alkaline Phosphatase: 49 IU/L (ref 44–121)
BUN/Creatinine Ratio: 15 (ref 10–24)
BUN: 14 mg/dL (ref 8–27)
Bilirubin Total: 0.4 mg/dL (ref 0.0–1.2)
CO2: 21 mmol/L (ref 20–29)
Calcium: 9.3 mg/dL (ref 8.6–10.2)
Chloride: 100 mmol/L (ref 96–106)
Creatinine, Ser: 0.95 mg/dL (ref 0.76–1.27)
Globulin, Total: 2.2 g/dL (ref 1.5–4.5)
Glucose: 131 mg/dL — ABNORMAL HIGH (ref 70–99)
Potassium: 4.6 mmol/L (ref 3.5–5.2)
Sodium: 138 mmol/L (ref 134–144)
Total Protein: 6.4 g/dL (ref 6.0–8.5)
eGFR: 89 mL/min/1.73 (ref >59)

## 2024-03-16 LAB — LIPID PANEL
Chol/HDL Ratio: 3.8 ratio (ref 0.0–5.0)
Cholesterol, Total: 137 mg/dL (ref 100–199)
HDL: 36 mg/dL — ABNORMAL LOW (ref >39)
LDL Chol Calc (NIH): 70 mg/dL (ref 0–99)
Triglycerides: 180 mg/dL — ABNORMAL HIGH (ref 0–149)
VLDL Cholesterol Cal: 31 mg/dL (ref 5–40)

## 2024-03-16 LAB — PSA: Prostate Specific Ag, Serum: 1 ng/mL (ref 0.0–4.0)

## 2024-03-17 ENCOUNTER — Encounter: Payer: Self-pay | Admitting: Cardiology

## 2024-03-17 ENCOUNTER — Ambulatory Visit: Attending: Cardiology | Admitting: Cardiology

## 2024-03-17 VITALS — BP 110/50 | HR 62 | Ht 71.0 in | Wt 218.0 lb

## 2024-03-17 DIAGNOSIS — R739 Hyperglycemia, unspecified: Secondary | ICD-10-CM | POA: Diagnosis not present

## 2024-03-17 DIAGNOSIS — Z131 Encounter for screening for diabetes mellitus: Secondary | ICD-10-CM | POA: Diagnosis not present

## 2024-03-17 DIAGNOSIS — R5383 Other fatigue: Secondary | ICD-10-CM

## 2024-03-17 DIAGNOSIS — I6522 Occlusion and stenosis of left carotid artery: Secondary | ICD-10-CM | POA: Diagnosis not present

## 2024-03-17 DIAGNOSIS — I25118 Atherosclerotic heart disease of native coronary artery with other forms of angina pectoris: Secondary | ICD-10-CM | POA: Diagnosis not present

## 2024-03-17 NOTE — Patient Instructions (Signed)
  Lab Work: CBC TSH HGB A1C  If you have labs (blood work) drawn today and your tests are completely normal, you will receive your results only by: MyChart Message (if you have MyChart) OR A paper copy in the mail If you have any lab test that is abnormal or we need to change your treatment, we will call you to review the results.  Testing/Procedures: CAROTID DUPLEX  Your physician has requested that you have a carotid duplex. This test is an ultrasound of the carotid arteries in your neck. It looks at blood flow through these arteries that supply the brain with blood. Allow one hour for this exam. There are no restrictions or special instructions.   Follow-Up: At [facility], you and your health needs are our priority.  As part of our continuing mission to provide you with exceptional heart care, our providers are all part of one team.  This team includes your primary Cardiologist (physician) and Advanced Practice Providers or APPs (Physician Assistants and Nurse Practitioners) who all work together to provide you with the care you need, when you need it.  Your next appointment:   1 year(s)  Provider:   Newman JINNY Lawrence, MD

## 2024-03-17 NOTE — Progress Notes (Signed)
 Cardiology Office Note:  .   Date:  03/17/2024  ID:  William Mason, DOB Dec 12, 1960, MRN [id number] PCP: Patient, No Pcp Per  [facility] Providers Cardiologist:  Newman Lawrence, MD PCP: Patient, No Pcp Per  Chief Complaint  Patient presents with   Coronary Artery Disease     William Mason is a 63 y.o. male with hypertension, hyperlipidemia, CAD, carotid artery disease, OSA on CPAP  History of Present Illness  Since his last visit with me a year ago, he has had issues with diverticulitis and UTI, seem to be under control now.  He denies any chest pain, shortness of breath.  He just feels fatigued in general.  Recent labs showed mildly elevated blood glucose on fasting labs.  Recent carotid artery duplex showed moderate disease.  He currently does not have a PCP but is looking for one.    Vitals:   03/17/24 1044  BP: (!) 110/50  Pulse: 62  SpO2: 98%      Review of Systems  Constitutional: Positive for malaise/fatigue.  Cardiovascular:  Negative for chest pain, dyspnea on exertion, leg swelling, palpitations and syncope.        Studies Reviewed: SABRA        EKG 03/17/2024: Sinus rhythm with Premature atrial complexes Right bundle branch block When compared with ECG of 28-Apr-2017 06:00, Premature ventricular complexes are no longer Present Premature atrial complexes are now Present Right bundle branch block is now Present  Carotid duplex 02/2024: Right Carotid: Velocities in the right ICA are consistent with a 1-39%  stenosis.  Left Carotid: Velocities in the left ICA are consistent with a 40-59%  stenosis.  Vertebrals: Bilateral vertebral arteries demonstrate antegrade flow.  Subclavians: Normal flow hemodynamics were seen in bilateral subclavian               arteries.   Echocardiogram 2023: Normal LV systolic function with visual EF 60-65%. Left ventricle cavity is normal in size. Normal left ventricular wall thickness. Normal global wall motion.  Normal diastolic filling pattern, normal LAP. No significant valvular heart disease. Compared to 04/01/2017 no significant change.  Labs 02/2024: Chol 137, TG 180, HDL 36, LDL 70 Cr 0.95, AST/ALT 46/46 PSA 1.0    Physical Exam Vitals and nursing note reviewed.  Constitutional:      General: He is not in acute distress. Neck:     Vascular: No JVD.  Cardiovascular:     Rate and Rhythm: Normal rate and regular rhythm.     Heart sounds: Normal heart sounds. No murmur heard. Pulmonary:     Effort: Pulmonary effort is normal.     Breath sounds: Normal breath sounds. No wheezing or rales.  Musculoskeletal:     Right lower leg: No edema.     Left lower leg: No edema.      VISIT DIAGNOSES:   ICD-10-CM   1. Coronary artery disease of native artery of native heart with stable angina pectoris (HCC)  I25.118 EKG 12-Lead    2. Hyperglycemia  R73.9 HgB A1c    3. Stenosis of left carotid artery  I65.22 VAS US  CAROTID    4. Screening for diabetes mellitus  Z13.1 HgB A1c    5. Fatigue, unspecified type  R53.83 CBC    TSH       William Mason is a 63 y.o. male with hypertension, hyperlipidemia, CAD, carotid artery disease, OSA on CPAP Assessment & Plan  CAD: PCI to OM in 2018.  No angina symptoms. Continue aspirin ,  Crestor  20 mg daily, Zetia  10 mg daily, metoprolol  succinate 50 mg daily.SABRA He does have minimally elevated LFTs, something he has had in the past. If he has not establish care with PCP next 6 months, will repeat LFTs in 6 months.  Carotid artery disease: Moderate, asymptomatic.  Continue aspirin , statin. Repeat carotid duplex in 1 year.  Hypertension: Well-controlled on metoprolol  succinate 50 mg daily.  Mixed hyperlipidemia: Well-controlled.  Hyperglycemia: Noted on fasting labs, will check A1c.  Generalized fatigue. On CPAP for OSA Will check CBC and TSH. If labs are unremarkable, depression could be a possibility, he has history in the past.  Could  consider resuming Cymbalta  until he sees PCP.       F/u in 1 year  Signed, Newman JINNY Lawrence, MD

## 2024-03-18 ENCOUNTER — Ambulatory Visit: Payer: Self-pay | Admitting: Cardiology

## 2024-03-18 LAB — TSH: TSH: 2.15 u[IU]/mL (ref 0.450–4.500)

## 2024-03-18 LAB — HEMOGLOBIN A1C
Est. average glucose Bld gHb Est-mCnc: 143 mg/dL
Hgb A1c MFr Bld: 6.6 % — ABNORMAL HIGH (ref 4.8–5.6)

## 2024-03-18 LAB — CBC
Hematocrit: 47.2 % (ref 37.5–51.0)
Hemoglobin: 15.4 g/dL (ref 13.0–17.7)
MCH: 31.2 pg (ref 26.6–33.0)
MCHC: 32.6 g/dL (ref 31.5–35.7)
MCV: 96 fL (ref 79–97)
Platelets: 205 x10E3/uL (ref 150–450)
RBC: 4.93 x10E6/uL (ref 4.14–5.80)
RDW: 12.9 % (ref 11.6–15.4)
WBC: 6.3 x10E3/uL (ref 3.4–10.8)

## 2024-03-22 NOTE — Telephone Encounter (Signed)
 We can send metformin  500 mg bid. Consider adding plain unflavored yogurt to diet if patient has any bloating, diarrhea, nausea symptoms. Please send 90 days with 2 refills, can be taken over by PCP in future once patient establishes a new PCP.  03/22/24

## 2024-03-23 MED ORDER — METFORMIN HCL 500 MG PO TABS: 500.0000 mg | ORAL_TABLET | Freq: Two times a day (BID) | ORAL | 2 refills | Status: AC

## 2024-03-23 NOTE — Telephone Encounter (Signed)
 Forwarding to MD to respond to patients question.  (Rx sent in)

## 2024-03-28 NOTE — Progress Notes (Signed)
 Pt reports that he has had some decreased appetite. Blood pressures have mostly been better, but reports that he has not been sleeping good. Pt reports that he will continue to monitor and let us  know if he needs anything.

## 2024-04-27 ENCOUNTER — Other Ambulatory Visit: Payer: Self-pay | Admitting: Cardiology

## 2024-05-22 ENCOUNTER — Other Ambulatory Visit: Payer: Self-pay | Admitting: Cardiology

## 2024-05-30 ENCOUNTER — Other Ambulatory Visit: Payer: Self-pay

## 2024-06-01 MED ORDER — ROSUVASTATIN CALCIUM 20 MG PO TABS: 20.0000 mg | ORAL_TABLET | Freq: Every day | ORAL | 2 refills | Status: AC

## 2024-08-11 ENCOUNTER — Other Ambulatory Visit: Payer: Self-pay

## 2024-08-18 MED ORDER — FLUTICASONE PROPIONATE 50 MCG/ACT NA SUSP: 1.0000 | Freq: Every day | NASAL | 0 refills | Status: AC

## 2024-08-18 NOTE — Telephone Encounter (Signed)
 Please send one-time prescription for 90 days with no refills, defer future refills to PCP.  Thanks MJP
